# Patient Record
Sex: Female | Born: 1995 | Race: White | Hispanic: No | Marital: Single | Smoking: Never smoker
Health system: Southern US, Community
[De-identification: ages and names within clinical notes are randomized; demographics above are authoritative.]

## PROBLEM LIST (undated history)

## (undated) ENCOUNTER — Inpatient Hospital Stay (HOSPITAL_COMMUNITY): Payer: Self-pay

## (undated) DIAGNOSIS — J45909 Unspecified asthma, uncomplicated: Secondary | ICD-10-CM

## (undated) DIAGNOSIS — F32A Depression, unspecified: Secondary | ICD-10-CM

## (undated) DIAGNOSIS — D649 Anemia, unspecified: Secondary | ICD-10-CM

## (undated) DIAGNOSIS — G43909 Migraine, unspecified, not intractable, without status migrainosus: Secondary | ICD-10-CM

## (undated) DIAGNOSIS — F419 Anxiety disorder, unspecified: Secondary | ICD-10-CM

## (undated) DIAGNOSIS — B999 Unspecified infectious disease: Secondary | ICD-10-CM

## (undated) DIAGNOSIS — J351 Hypertrophy of tonsils: Secondary | ICD-10-CM

## (undated) DIAGNOSIS — Z22322 Carrier or suspected carrier of Methicillin resistant Staphylococcus aureus: Secondary | ICD-10-CM

## (undated) DIAGNOSIS — Z87898 Personal history of other specified conditions: Secondary | ICD-10-CM

## (undated) DIAGNOSIS — G5600 Carpal tunnel syndrome, unspecified upper limb: Secondary | ICD-10-CM

## (undated) DIAGNOSIS — F329 Major depressive disorder, single episode, unspecified: Secondary | ICD-10-CM

## (undated) DIAGNOSIS — J302 Other seasonal allergic rhinitis: Secondary | ICD-10-CM

## (undated) HISTORY — PX: CARPAL TUNNEL RELEASE: SHX101

---
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
## 2015-01-24 ENCOUNTER — Telehealth: Payer: Self-pay | Admitting: Family Medicine

## 2015-01-24 ENCOUNTER — Encounter (HOSPITAL_COMMUNITY): Payer: Self-pay | Admitting: Emergency Medicine

## 2015-01-24 ENCOUNTER — Emergency Department (HOSPITAL_COMMUNITY)
Admission: EM | Admit: 2015-01-24 | Discharge: 2015-01-24 | Disposition: A | Discharge status: Home / Self Care | Payer: BLUE CROSS/BLUE SHIELD | Attending: Emergency Medicine | Admitting: Emergency Medicine

## 2015-01-24 DIAGNOSIS — R197 Diarrhea, unspecified: Secondary | ICD-10-CM

## 2015-01-24 DIAGNOSIS — R1084 Generalized abdominal pain: Secondary | ICD-10-CM | POA: Insufficient documentation

## 2015-01-24 DIAGNOSIS — Z3202 Encounter for pregnancy test, result negative: Secondary | ICD-10-CM | POA: Diagnosis not present

## 2015-01-24 DIAGNOSIS — F419 Anxiety disorder, unspecified: Secondary | ICD-10-CM | POA: Insufficient documentation

## 2015-01-24 DIAGNOSIS — F329 Major depressive disorder, single episode, unspecified: Secondary | ICD-10-CM | POA: Insufficient documentation

## 2015-01-24 DIAGNOSIS — Z8742 Personal history of other diseases of the female genital tract: Secondary | ICD-10-CM | POA: Diagnosis not present

## 2015-01-24 DIAGNOSIS — K625 Hemorrhage of anus and rectum: Secondary | ICD-10-CM | POA: Diagnosis not present

## 2015-01-24 DIAGNOSIS — G4700 Insomnia, unspecified: Secondary | ICD-10-CM | POA: Insufficient documentation

## 2015-01-24 DIAGNOSIS — Z79899 Other long term (current) drug therapy: Secondary | ICD-10-CM | POA: Insufficient documentation

## 2015-01-24 LAB — URINALYSIS, ROUTINE W REFLEX MICROSCOPIC
BILIRUBIN URINE: NEGATIVE
Glucose, UA: NEGATIVE mg/dL
Hgb urine dipstick: NEGATIVE
KETONES UR: NEGATIVE mg/dL
LEUKOCYTES UA: NEGATIVE
Nitrite: NEGATIVE
PROTEIN: NEGATIVE mg/dL
Specific Gravity, Urine: 1.03 (ref 1.005–1.030)
UROBILINOGEN UA: 0.2 mg/dL (ref 0.0–1.0)
pH: 5.5 (ref 5.0–8.0)

## 2015-01-24 LAB — COMPREHENSIVE METABOLIC PANEL
ALT: 19 U/L (ref 14–54)
ANION GAP: 12 (ref 5–15)
AST: 27 U/L (ref 15–41)
Albumin: 4.1 g/dL (ref 3.5–5.0)
Alkaline Phosphatase: 63 U/L (ref 38–126)
BUN: 9 mg/dL (ref 6–20)
CO2: 27 mmol/L (ref 22–32)
Calcium: 9.4 mg/dL (ref 8.9–10.3)
Chloride: 100 mmol/L — ABNORMAL LOW (ref 101–111)
Creatinine, Ser: 0.62 mg/dL (ref 0.44–1.00)
GFR calc non Af Amer: >60 mL/min (ref >60)
Glucose, Bld: 160 mg/dL — ABNORMAL HIGH (ref 65–99)
Potassium: 3.9 mmol/L (ref 3.5–5.1)
Sodium: 139 mmol/L (ref 135–145)
Total Bilirubin: 0.5 mg/dL (ref 0.3–1.2)
Total Protein: 8.1 g/dL (ref 6.5–8.1)

## 2015-01-24 LAB — POC URINE PREG, ED: PREG TEST UR: NEGATIVE

## 2015-01-24 LAB — CBC WITH DIFFERENTIAL/PLATELET
Basophils Absolute: 0 10*3/uL (ref 0.0–0.1)
Basophils Relative: 0 % (ref 0–1)
Eosinophils Absolute: 0.1 10*3/uL (ref 0.0–0.7)
Eosinophils Relative: 1 % (ref 0–5)
HEMATOCRIT: 39.9 % (ref 36.0–46.0)
HEMOGLOBIN: 12.9 g/dL (ref 12.0–15.0)
LYMPHS PCT: 37 % (ref 12–46)
Lymphs Abs: 3.4 10*3/uL (ref 0.7–4.0)
MCH: 26.2 pg (ref 26.0–34.0)
MCHC: 32.3 g/dL (ref 30.0–36.0)
MCV: 80.9 fL (ref 78.0–100.0)
Monocytes Absolute: 0.3 10*3/uL (ref 0.1–1.0)
Monocytes Relative: 3 % (ref 3–12)
NEUTROS PCT: 59 % (ref 43–77)
Neutro Abs: 5.6 10*3/uL (ref 1.7–7.7)
Platelets: 361 10*3/uL (ref 150–400)
RBC: 4.93 MIL/uL (ref 3.87–5.11)
RDW: 14.6 % (ref 11.5–15.5)
WBC: 9.4 10*3/uL (ref 4.0–10.5)

## 2015-01-24 LAB — POC OCCULT BLOOD, ED: Fecal Occult Bld: NEGATIVE

## 2015-01-24 MED ORDER — SODIUM CHLORIDE 0.9 % IV BOLUS (SEPSIS)
1000.0000 mL | Freq: Once | INTRAVENOUS | Status: AC
  Administered 2015-01-24: 1000 mL via INTRAVENOUS

## 2015-01-24 MED ORDER — DICYCLOMINE HCL 20 MG PO TABS: 20.0000 mg | ORAL_TABLET | Freq: Two times a day (BID) | ORAL | Status: DC

## 2015-01-24 NOTE — Discharge Instructions (Signed)
Abdominal Pain, Women °Abdominal (stomach, pelvic, or belly) pain can be caused by many things. It is important to tell your doctor: °· The location of the pain. °· Does it come and go or is it present all the time? °· Are there things that start the pain (eating certain foods, exercise)? °· Are there other symptoms associated with the pain (fever, nausea, vomiting, diarrhea)? °All of this is helpful to know when trying to find the cause of the pain. °CAUSES  °· Stomach: virus or bacteria infection, or ulcer. °· Intestine: appendicitis (inflamed appendix), regional ileitis (Crohn's disease), ulcerative colitis (inflamed colon), irritable bowel syndrome, diverticulitis (inflamed diverticulum of the colon), or cancer of the stomach or intestine. °· Gallbladder disease or stones in the gallbladder. °· Kidney disease, kidney stones, or infection. °· Pancreas infection or cancer. °· Fibromyalgia (pain disorder). °· Diseases of the female organs: °· Uterus: fibroid (non-cancerous) tumors or infection. °· Fallopian tubes: infection or tubal pregnancy. °· Ovary: cysts or tumors. °· Pelvic adhesions (scar tissue). °· Endometriosis (uterus lining tissue growing in the pelvis and on the pelvic organs). °· Pelvic congestion syndrome (female organs filling up with blood just before the menstrual period). °· Pain with the menstrual period. °· Pain with ovulation (producing an egg). °· Pain with an IUD (intrauterine device, birth control) in the uterus. °· Cancer of the female organs. °· Functional pain (pain not caused by a disease, may improve without treatment). °· Psychological pain. °· Depression. °DIAGNOSIS  °Your doctor will decide the seriousness of your pain by doing an examination. °· Blood tests. °· X-rays. °· Ultrasound. °· CT scan (computed tomography, special type of X-ray). °· MRI (magnetic resonance imaging). °· Cultures, for infection. °· Barium enema (dye inserted in the large intestine, to better view it with  X-rays). °· Colonoscopy (looking in intestine with a lighted tube). °· Laparoscopy (minor surgery, looking in abdomen with a lighted tube). °· Major abdominal exploratory surgery (looking in abdomen with a large incision). °TREATMENT  °The treatment will depend on the cause of the pain.  °· Many cases can be observed and treated at home. °· Over-the-counter medicines recommended by your caregiver. °· Prescription medicine. °· Antibiotics, for infection. °· Birth control pills, for painful periods or for ovulation pain. °· Hormone treatment, for endometriosis. °· Nerve blocking injections. °· Physical therapy. °· Antidepressants. °· Counseling with a psychologist or psychiatrist. °· Minor or major surgery. °HOME CARE INSTRUCTIONS  °· Do not take laxatives, unless directed by your caregiver. °· Take over-the-counter pain medicine only if ordered by your caregiver. Do not take aspirin because it can cause an upset stomach or bleeding. °· Try a clear liquid diet (broth or water) as ordered by your caregiver. Slowly move to a bland diet, as tolerated, if the pain is related to the stomach or intestine. °· Have a thermometer and take your temperature several times a day, and record it. °· Bed rest and sleep, if it helps the pain. °· Avoid sexual intercourse, if it causes pain. °· Avoid stressful situations. °· Keep your follow-up appointments and tests, as your caregiver orders. °· If the pain does not go away with medicine or surgery, you may try: °· Acupuncture. °· Relaxation exercises (yoga, meditation). °· Group therapy. °· Counseling. °SEEK MEDICAL CARE IF:  °· You notice certain foods cause stomach pain. °· Your home care treatment is not helping your pain. °· You need stronger pain medicine. °· You want your IUD removed. °· You feel faint or   lightheaded.  You develop nausea and vomiting.  You develop a rash.  You are having side effects or an allergy to your medicine. SEEK IMMEDIATE MEDICAL CARE IF:   Your  pain does not go away or gets worse.  You have a fever.  Your pain is felt only in portions of the abdomen. The right side could possibly be appendicitis. The left lower portion of the abdomen could be colitis or diverticulitis.  You are passing blood in your stools (bright red or black tarry stools, with or without vomiting).  You have blood in your urine.  You develop chills, with or without a fever.  You pass out. MAKE SURE YOU:   Understand these instructions.  Will watch your condition.  Will get help right away if you are not doing well or get worse. Document Released: 05/09/2007 Document Revised: 11/26/2013 Document Reviewed: 05/29/2009 [facility] Patient Information 2015 [place], [place]. This information is not intended to replace advice given to you by your health care provider. Make sure you discuss any questions you have with your health care provider.  Bloody Diarrhea Bloody diarrhea can be caused by many different conditions. Most of the time bloody diarrhea is the result of food poisoning or minor infections. Bloody diarrhea usually improves over 2 to 3 days of rest and fluid replacement. Other conditions that can cause bloody diarrhea include:  Internal bleeding.  Infection.  Diseases of the bowel and colon. Internal bleeding from an ulcer or bowel disease can be severe and requires hospital care or even surgery. DIAGNOSIS  To find out what is wrong your caregiver may check your:  Stool.  Blood.  Results from a test that looks inside the body (endoscopy). TREATMENT   Get plenty of rest.  Drink enough water and fluids to keep your urine clear or pale yellow.  Do not smoke.  Solid foods and dairy products should be avoided until your illness improves.  As you improve, slowly return to a regular diet with easily-digested foods first. Examples are:  Bananas.  Rice.  Toast.  Crackers. You should only need these for about 2 days before adding more  normal foods to your diet.  Avoid spicy or fatty foods as well as caffeine and alcohol for several days.  Medicine to control cramping and diarrhea can relieve symptoms but may prolong some cases of bloody diarrhea. Antibiotics can speed recovery from diarrhea due to some bacterial infections. Call your caregiver if diarrhea does not get better in 3 days. SEEK MEDICAL CARE IF:   You do not improve after 3 days.  Your diarrhea improves but your stool appears black. SEEK IMMEDIATE MEDICAL CARE IF:   You become extremely weak or faint.  You become very sweaty.  You have increased pain or bleeding.  You develop repeated vomiting.  You vomit and you see blood or the vomit looks black in color.  You have a fever. Document Released: 07/12/2005 Document Revised: 10/04/2011 Document Reviewed: 06/13/2009 [facility] Patient Information 2015 [place], [place]. This information is not intended to replace advice given to you by your health care provider. Make sure you discuss any questions you have with your health care provider.

## 2015-01-24 NOTE — Telephone Encounter (Signed)
[facility] - Client TELEPHONE ADVICE RECORD [facility] Patient Name: Alexandra MendsCHRISTINA Meadows DOB: 01/06/1996 Initial Comment Caller states wants to schedule appt; past week and half having yellow diarrhea; now has blood in stool; Nurse Assessment Nurse: Lane HackerHarley, RN, Windy Date/Time (Eastern Time): 01/24/2015 2:29:23 PM Confirm and document reason for call. If symptomatic, describe symptoms. ---Caller states that for the past week and half she has been having yellow diarrhea, and now today has blood in stool in addition to her menses. Severe HA today also, not like her traditional migraines. No fever. Has the patient traveled out of the country within the last 30 days? ---No Does the patient require triage? ---Yes Related visit to physician within the last 2 weeks? ---No Does the PT have any chronic conditions? (i.e. diabetes, asthma, etc.) ---Yes List chronic conditions. ---Migraines Did the patient indicate they were pregnant? ---No Guidelines Guideline Title Affirmed Question Affirmed Notes Diarrhea [1] Blood in the stool AND [2] moderate or large amount of blood moderate amt; 2 episodes of diarrhea in last 24 hours; constant pain not relieved with diarrhea Final Disposition User Go to ED Now Lane HackerHarley, Charity fundraiserN, [place]

## 2015-01-24 NOTE — Telephone Encounter (Signed)
Called and spoke to patient. Patients states she and her mom are en route to take her to a [facility] care. Told patient if for some reason that does not work out to call office and we will be happy to see her next week anytime. Patient verbalized understanding.

## 2015-01-24 NOTE — ED Notes (Signed)
Pt reports diarrhea for the past week and a half. Today noticed bright red blood in stool for the first time. Also having bilateral lower abd pain.

## 2015-01-24 NOTE — ED Provider Notes (Signed)
CSN: [id number]     Arrival date & time 01/24/15  1554 History   First MD Initiated Contact with Patient 01/24/15 1617     Chief Complaint  Patient presents with  . Rectal Bleeding  . Diarrhea     (Consider location/radiation/quality/duration/timing/severity/associated sxs/prior Treatment) HPI Alexandra Meadows is a 19 y.o. female who comes in for evaluation of abdominal discomfort and bloody diarrhea. Patient states for the past week and a half she has had watery, yellow diarrhea, but just noticed yesterday evening she began to have blood mixed in her stool as well as in the toilet bowl. She reports associated bilateral lower abdominal cramps.  She also reports she started her menstrual cycle yesterday, but is certain the blood is not related to menses. Denies any unusual vaginal bleeding or discharge. She denies any fevers, chills, nausea or vomiting, loss of appetite, urinary symptoms. She denies any recent travel, changes in medications. Patient reports she is a vegetarian and does not eat meat, but has been eating more egg sandwiches. Denies any history of Crohn's disease, ulcerative colitis, bleeding disorders.  Past Medical History  Diagnosis Date  . Headache(784.0)   . ADHD (attention deficit hyperactivity disorder)   . Insomnia   . Metrorrhagia   . Anxiety   . Depression   . Back pain    Past Surgical History  Procedure Laterality Date  . Knee arthroscopy Left 02/28/2014    Procedure: ARTHROSCOPY LEFT KNEE WITH SYNOVECTOMY LIMITED, PARTIAL LATERAL MENISECTOMY;  Surgeon: Loreta Aveaniel F Murphy, MD;  Location: [facility];  Service: Orthopedics;  Laterality: Left;  Marland Kitchen. Mouth surgery     History reviewed. No pertinent family history. History  Substance Use Topics  . Smoking status: Never Smoker   . Smokeless tobacco: Never Used     Comment: quit  . Alcohol Use: No   OB History    No data available     Review of Systems A 10 point review of systems was completed and  was negative except for pertinent positives and negatives as mentioned in the history of present illness     Allergies  Manuka honey and Tetanus toxoids  Home Medications   Prior to Admission medications   Medication Sig Start Date End Date Taking? Authorizing Provider  ibuprofen (ADVIL,MOTRIN) 400 MG tablet Take 1 tablet (400 mg total) by mouth every 6 (six) hours as needed. 09/28/14  Yes Fayrene HelperBowie Tran, PA-C  lamoTRIgine (LAMICTAL) 100 MG tablet TAKE 1 TABLET BY MOUTH EVERY DAY Patient taking differently: Take 1 tablet by mouth twice a day. 09/25/14  Yes Adam R Jaffe, DO  LORYNA 3-0.02 MG tablet TAKE 1 TABLET BY MOUTH DAILY. Patient taking differently: Take 1 tablet by mouth daily. 09/23/14  Yes Nelwyn SalisburyStephen A Fry, MD  SUMAtriptan (IMITREX) 100 MG tablet Take 1 tablet (100 mg total) by mouth once as needed for migraine. Patient taking differently: Take 100 mg by mouth daily as needed for migraine.  12/27/14 12/27/15 Yes Nelwyn SalisburyStephen A Fry, MD  amoxicillin-clavulanate (AUGMENTIN) 400-57 MG/5ML suspension Take 10 mLs (800 mg total) by mouth 2 (two) times daily. Patient not taking: Reported on 01/24/2015 12/27/14   Nelwyn SalisburyStephen A Fry, MD  cephALEXin [facility](KEFLEX) 250 MG/5ML suspension Take 5 mLs (250 mg total) by mouth 2 (two) times daily. Patient not taking: Reported on 01/24/2015 12/30/14   Nelwyn SalisburyStephen A Fry, MD  desoximetasone (TOPICORT) 0.05 % cream Apply 1 application topically 2 (two) times daily. Patient not taking: Reported on 09/28/2014 03/12/11   Jeannett SeniorStephen  Marguerita Beards, MD  dicyclomine (BENTYL) 20 MG tablet Take 1 tablet (20 mg total) by mouth 2 (two) times daily. 01/24/15   Joycie Peek, PA-C  methocarbamol (ROBAXIN) 500 MG tablet Take 1 tablet (500 mg total) by mouth 2 (two) times daily. Patient taking differently: Take 500 mg by mouth every 6 (six) hours as needed for muscle spasms.  09/28/14   Fayrene Helper, PA-C  metroNIDAZOLE (FLAGYL) 500 MG tablet Take 1 tablet (500 mg total) by mouth 2 (two) times daily. Patient not taking:  Reported on 09/28/2014 09/16/14   Terressa Koyanagi, DO  metroNIDAZOLE (METROGEL VAGINAL) 0.75 % vaginal gel Place 1 Applicatorful vaginally at bedtime. For 5 days Patient not taking: Reported on 01/24/2015 10/04/14   Terressa Koyanagi, DO  silver sulfADIAZINE (SILVADENE) 1 % cream Apply 1 application topically 2 (two) times daily. Patient taking differently: Apply 1 application topically 2 (two) times daily as needed (burns).  09/04/14   Nelwyn Salisbury, MD   BP 134/84 mmHg  Pulse 82  Temp(Src) 97.5 F (36.4 C) (Oral)  Resp 18  SpO2 100%  LMP 12/27/2014 (Exact Date) Physical Exam  Constitutional: She is oriented to person, place, and time. She appears well-developed and well-nourished.  HENT:  Head: Normocephalic and atraumatic.  Mouth/Throat: Oropharynx is clear and moist.  Eyes: Conjunctivae are normal. Pupils are equal, round, and reactive to light. Right eye exhibits no discharge. Left eye exhibits no discharge. No scleral icterus.  Neck: Neck supple.  Cardiovascular: Normal rate, regular rhythm and normal heart sounds.   Pulmonary/Chest: Effort normal and breath sounds normal. No respiratory distress. She has no wheezes. She has no rales.  Abdominal: Soft. There is no tenderness.  Bilateral lower abdominal mild tenderness to palpation. Negative Rovsing's and heel tap. Abdomen is otherwise soft, nondistended without rebound or guarding. No lesions or deformities noted.  Musculoskeletal: She exhibits no tenderness.  Neurological: She is alert and oriented to person, place, and time.  Cranial Nerves II-XII grossly intact  Skin: Skin is warm and dry. No rash noted.  Psychiatric: She has a normal mood and affect.  Nursing note and vitals reviewed.   ED Course  Procedures (including critical care time) Labs Review Labs Reviewed  COMPREHENSIVE METABOLIC PANEL - Abnormal; Notable for the following:    Chloride 100 (*)    Glucose, Bld 160 (*)    All other components within normal limits   URINALYSIS, ROUTINE W REFLEX MICROSCOPIC (NOT AT [facility]) - Abnormal; Notable for the following:    Color, Urine AMBER (*)    APPearance CLOUDY (*)    All other components within normal limits  CBC WITH DIFFERENTIAL/PLATELET  POC OCCULT BLOOD, ED  POC URINE PREG, ED    Imaging Review No results found.   EKG Interpretation None     Meds given in ED:  Medications  sodium chloride 0.9 % bolus 1,000 mL (1,000 mLs Intravenous New Bag/Given 01/24/15 1741)    New Prescriptions   DICYCLOMINE (BENTYL) 20 MG TABLET    Take 1 tablet (20 mg total) by mouth 2 (two) times daily.   Filed Vitals:   01/24/15 1557  BP: 134/84  Pulse: 82  Temp: 97.5 F (36.4 C)  TempSrc: Oral  Resp: 18  SpO2: 100%    MDM  Vitals stable - WNL -afebrile Pt resting comfortably in ED. PE--lower abdominal cramping without any focal tenderness. No evidence of acute appendicitis or other intra-abdominal emergency. Labwork--Hemoccult-negative, no anemia. Pregnancy negative Labs otherwise noncontributory.  DDX--patient with one episode of reported bloody stool. No evidence of acute GI bleed at this time. We'll have patient follow-up with GI for further evaluation and management of symptoms. No evidence of appendicitis or other acute intra-abdominal emergency. Encourage appropriate rehydration therapy at home. Bentyl for abdominal spasms.  I discussed all relevant lab findings and imaging results with pt and they verbalized understanding. Discussed f/u with PCP within 48 hrs and return precautions, pt very amenable to plan. Prior to patient discharge, I discussed and reviewed this case with Dr.McManus   Final diagnoses:  Diarrhea  Generalized abdominal discomfort        Joycie Peek, PA-C 01/24/15 1839  Samuel Jester, DO 01/27/15 [phone]

## 2015-02-03 ENCOUNTER — Encounter: Payer: Self-pay | Admitting: Family Medicine

## 2015-02-03 ENCOUNTER — Ambulatory Visit (INDEPENDENT_AMBULATORY_CARE_PROVIDER_SITE_OTHER): Payer: BLUE CROSS/BLUE SHIELD | Admitting: Family Medicine

## 2015-02-03 VITALS — BP 133/76 | HR 96 | Temp 98.3°F | Ht 64.0 in | Wt 132.0 lb

## 2015-02-03 DIAGNOSIS — R1030 Lower abdominal pain, unspecified: Secondary | ICD-10-CM | POA: Diagnosis not present

## 2015-02-03 MED ORDER — CIPROFLOXACIN HCL 500 MG PO TABS: 500.0000 mg | ORAL_TABLET | Freq: Two times a day (BID) | ORAL | Status: DC

## 2015-02-03 NOTE — Progress Notes (Signed)
   Subjective:    Patient ID: Alexandra Meadows, female    DOB: 01/18/1996, 19 y.o.   MRN: [id number]  HPI Here to follow up an ER visit on 01-24-15 for abdominal pains and diarrhea with a single episode of blood in the stool. No fevers. No recent travelling. Labs that day were normal including a UA. Her WBC was 9.4. She was told to see us. Now the diarrhea has improved but she still has lower abdominal cramps. No fever. She does have urinary urgency and burning however.    Review of Systems  Constitutional: Negative.   Respiratory: Negative.   Cardiovascular: Negative.   Gastrointestinal: Positive for abdominal pain. Negative for nausea, vomiting, diarrhea, constipation and rectal pain.  Genitourinary: Positive for dysuria, urgency and frequency.       Objective:   Physical Exam  Constitutional: She appears well-developed and well-nourished. No distress.  Cardiovascular: Normal rate, regular rhythm, normal heart sounds and intact distal pulses.   Pulmonary/Chest: Effort normal and breath sounds normal.  Abdominal: Soft. Bowel sounds are normal. She exhibits no distension and no mass. There is no rebound and no guarding.  Mildly tender in bot lower quadrants.           Assessment & Plan:  This is probably a UTI. She is not able to produce a urine sample today so we will treat empirically with Cipro. Drink plenty of fluids.

## 2015-02-03 NOTE — Progress Notes (Signed)
Pre visit review using our clinic review tool, if applicable. No additional management support is needed unless otherwise documented below in the visit note. 

## 2015-03-26 ENCOUNTER — Other Ambulatory Visit: Payer: Self-pay | Admitting: Family Medicine

## 2015-04-19 ENCOUNTER — Other Ambulatory Visit: Payer: Self-pay | Admitting: Neurology

## 2015-04-21 NOTE — Telephone Encounter (Signed)
Pls review.

## 2015-05-05 ENCOUNTER — Other Ambulatory Visit: Payer: Self-pay | Admitting: Neurology

## 2015-05-06 ENCOUNTER — Other Ambulatory Visit: Payer: Self-pay | Admitting: *Deleted

## 2015-05-06 MED ORDER — LAMOTRIGINE 100 MG PO TABS: 100.0000 mg | ORAL_TABLET | Freq: Two times a day (BID) | ORAL | Status: DC

## 2015-06-13 ENCOUNTER — Ambulatory Visit (INDEPENDENT_AMBULATORY_CARE_PROVIDER_SITE_OTHER): Payer: BLUE CROSS/BLUE SHIELD | Admitting: Family Medicine

## 2015-06-13 ENCOUNTER — Encounter: Payer: Self-pay | Admitting: Family Medicine

## 2015-06-13 VITALS — BP 102/69 | HR 66 | Temp 98.4°F | Ht 64.0 in | Wt 144.0 lb

## 2015-06-13 DIAGNOSIS — J45909 Unspecified asthma, uncomplicated: Secondary | ICD-10-CM | POA: Insufficient documentation

## 2015-06-13 DIAGNOSIS — J4520 Mild intermittent asthma, uncomplicated: Secondary | ICD-10-CM

## 2015-06-13 MED ORDER — ALBUTEROL SULFATE HFA 108 (90 BASE) MCG/ACT IN AERS: 2.0000 | INHALATION_SPRAY | RESPIRATORY_TRACT | Status: DC | PRN

## 2015-06-13 NOTE — Progress Notes (Signed)
   Subjective:    Patient ID: Neta MendsChristina Bhullar, female    DOB: 08/02/1995, 19 y.o.   MRN: [id number]  HPI Here for what she thinks is asthma. She describes episodes of coughing, wheezing, and SOB that are often brought on by exercise. This has been happening for several years but has become mor eof an issue this year. No chest pains. She saw [facility] last summer and they were debating as to whether this is true asthma. They gave her a Proair Respiclick to try as a diagnostic maneuver. She says this is difficult for her to use but it does help.    Review of Systems  Constitutional: Negative.   HENT: Negative.   Eyes: Negative.   Respiratory: Positive for cough, chest tightness, shortness of breath and wheezing.   Cardiovascular: Negative.        Objective:   Physical Exam  Constitutional: She appears well-developed and well-nourished. No distress.  HENT:  Right Ear: External ear normal.  Left Ear: External ear normal.  Nose: Nose normal.  Mouth/Throat: Oropharynx is clear and moist.  Eyes: Conjunctivae are normal.  Neck: No thyromegaly present.  Cardiovascular: Normal rate, regular rhythm, normal heart sounds and intact distal pulses.   Pulmonary/Chest: Effort normal and breath sounds normal.  Lymphadenopathy:    She has no cervical adenopathy.          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 Recheck prn

## 2015-06-13 NOTE — Progress Notes (Signed)
Pre visit review using our clinic review tool, if applicable. No additional management support is needed unless otherwise documented below in the visit note. 

## 2015-07-08 ENCOUNTER — Other Ambulatory Visit: Payer: Self-pay | Admitting: Family Medicine

## 2015-07-08 NOTE — Telephone Encounter (Signed)
She already has refills to last until 09-23-15

## 2015-08-20 ENCOUNTER — Telehealth: Payer: Self-pay | Admitting: Family Medicine

## 2015-08-20 ENCOUNTER — Ambulatory Visit: Payer: Self-pay | Admitting: Family Medicine

## 2015-08-20 NOTE — Telephone Encounter (Signed)
Spoke to the pt.  She stated she believes the nausea/vomiting is due to the pregnancy and not illness.  She has her first appointment with the ob/gyn 08/21/15 (tomorrow).  Discussed with Dr. Clent Ridges.  Since pt is able to keep down fluids than ok to wait and see ob tomorrow.  Appointment for this afternoon cancelled.

## 2015-08-20 NOTE — Telephone Encounter (Signed)
PLEASE NOTE: All timestamps contained within this report are represented as [place] Standard Time. CONFIDENTIALTY NOTICE: This fax transmission is intended only for the addressee. It contains information that is legally privileged, confidential or otherwise protected from use or disclosure. If you are not the intended recipient, you are strictly prohibited from reviewing, disclosing, copying using or disseminating any of this information or taking any action in reliance on or regarding this information. If you have received this fax in error, please notify us immediately by telephone so that we can arrange for its return to [place]. Phone: [phone], Toll-Free: [phone], Fax: [phone] Page: 1 of 1 Call Id: [id number] [facility] - Client TELEPHONE ADVICE RECORD [facility] Patient Name: Alexandra Meadows DOB: [birth year] Initial Comment Caller States 7 wks preg, has been vomiting for the past 4 days, and feeling dizzy. Nurse Assessment Nurse: Elijah Birk, RN, Lynda Date/Time (Eastern Time): 08/20/2015 11:49:38 AM Confirm and document reason for call. If symptomatic, describe symptoms. You must click the next button to save text entered. ---Caller states she is 7 wks. pregnant, Has been vomiting for the past 4 days, and feeling dizzy. Able to keep down small amounts of fluid, but vomits solid food. Has the patient traveled out of the country within the last 30 days? ---Not Applicable Does the patient have any new or worsening symptoms? ---Yes Will a triage be completed? ---Yes Related visit to physician within the last 2 weeks? ---No Does the PT have any chronic conditions? (i.e. diabetes, asthma, etc.) ---No Did the patient indicate they were pregnant? ---Yes How many weeks gestation? ---7 Have you felt decreased fetal movement? ---Early Pregnancy - No Fetal Movement Felt Yet Is this a behavioral health or substance abuse call?  ---No Guidelines Guideline Title Affirmed Question Affirmed Notes Pregnancy - Morning Sickness (Nausea and Vomiting of Pregnancy) [1] SEVERE vomiting (e.g., 8 or more times / day) AND [2] present > 8 hours Final Disposition User Go to ED Now (or PCP triage) Elijah Birk, RN, Lynda Comments Caller was going to be seen in an UC or ER, but asked if there was any availability to see her Dr. today. Appt. scheduled. Referrals GO TO FACILITY UNDECIDED  REFERRED TO PCP OFFICE Disagree/Comply: Comply

## 2015-08-21 ENCOUNTER — Emergency Department (HOSPITAL_COMMUNITY): Payer: 59

## 2015-08-21 ENCOUNTER — Emergency Department (HOSPITAL_COMMUNITY)
Admission: EM | Admit: 2015-08-21 | Discharge: 2015-08-21 | Disposition: A | Discharge status: Home / Self Care | Payer: 59 | Attending: Emergency Medicine | Admitting: Emergency Medicine

## 2015-08-21 ENCOUNTER — Encounter (HOSPITAL_COMMUNITY): Payer: Self-pay | Admitting: Emergency Medicine

## 2015-08-21 DIAGNOSIS — O219 Vomiting of pregnancy, unspecified: Secondary | ICD-10-CM | POA: Diagnosis present

## 2015-08-21 DIAGNOSIS — Z8742 Personal history of other diseases of the female genital tract: Secondary | ICD-10-CM | POA: Diagnosis not present

## 2015-08-21 DIAGNOSIS — F419 Anxiety disorder, unspecified: Secondary | ICD-10-CM | POA: Diagnosis not present

## 2015-08-21 DIAGNOSIS — Z3A01 Less than 8 weeks gestation of pregnancy: Secondary | ICD-10-CM | POA: Insufficient documentation

## 2015-08-21 DIAGNOSIS — R52 Pain, unspecified: Secondary | ICD-10-CM

## 2015-08-21 DIAGNOSIS — R1030 Lower abdominal pain, unspecified: Secondary | ICD-10-CM | POA: Insufficient documentation

## 2015-08-21 DIAGNOSIS — Z3490 Encounter for supervision of normal pregnancy, unspecified, unspecified trimester: Secondary | ICD-10-CM

## 2015-08-21 DIAGNOSIS — Z79899 Other long term (current) drug therapy: Secondary | ICD-10-CM | POA: Diagnosis not present

## 2015-08-21 DIAGNOSIS — R112 Nausea with vomiting, unspecified: Secondary | ICD-10-CM

## 2015-08-21 DIAGNOSIS — O99341 Other mental disorders complicating pregnancy, first trimester: Secondary | ICD-10-CM | POA: Insufficient documentation

## 2015-08-21 DIAGNOSIS — O9989 Other specified diseases and conditions complicating pregnancy, childbirth and the puerperium: Secondary | ICD-10-CM | POA: Diagnosis not present

## 2015-08-21 DIAGNOSIS — F329 Major depressive disorder, single episode, unspecified: Secondary | ICD-10-CM | POA: Insufficient documentation

## 2015-08-21 DIAGNOSIS — Z8669 Personal history of other diseases of the nervous system and sense organs: Secondary | ICD-10-CM | POA: Diagnosis not present

## 2015-08-21 LAB — COMPREHENSIVE METABOLIC PANEL
ALT: 28 U/L (ref 14–54)
AST: 24 U/L (ref 15–41)
Albumin: 4.8 g/dL (ref 3.5–5.0)
Alkaline Phosphatase: 57 U/L (ref 38–126)
Anion gap: 12 (ref 5–15)
BUN: 8 mg/dL (ref 6–20)
CO2: 22 mmol/L (ref 22–32)
Calcium: 9.4 mg/dL (ref 8.9–10.3)
Chloride: 101 mmol/L (ref 101–111)
Creatinine, Ser: 0.47 mg/dL (ref 0.44–1.00)
GFR calc Af Amer: >60 mL/min (ref >60)
GFR calc non Af Amer: >60 mL/min (ref >60)
Glucose, Bld: 78 mg/dL (ref 65–99)
Potassium: 3.5 mmol/L (ref 3.5–5.1)
Sodium: 135 mmol/L (ref 135–145)
Total Bilirubin: 0.5 mg/dL (ref 0.3–1.2)
Total Protein: 8 g/dL (ref 6.5–8.1)

## 2015-08-21 LAB — WET PREP, GENITAL
Clue Cells Wet Prep HPF POC: NONE SEEN
Sperm: NONE SEEN
Trich, Wet Prep: NONE SEEN
Yeast Wet Prep HPF POC: NONE SEEN

## 2015-08-21 LAB — CBC
HCT: 37.5 % (ref 36.0–46.0)
HEMOGLOBIN: 12.2 g/dL (ref 12.0–15.0)
MCH: 26.3 pg (ref 26.0–34.0)
MCHC: 32.5 g/dL (ref 30.0–36.0)
MCV: 80.8 fL (ref 78.0–100.0)
Platelets: 335 10*3/uL (ref 150–400)
RBC: 4.64 MIL/uL (ref 3.87–5.11)
RDW: 14.3 % (ref 11.5–15.5)
WBC: 10.4 10*3/uL (ref 4.0–10.5)

## 2015-08-21 LAB — URINALYSIS, ROUTINE W REFLEX MICROSCOPIC
Bilirubin Urine: NEGATIVE
Glucose, UA: NEGATIVE mg/dL
HGB URINE DIPSTICK: NEGATIVE
Ketones, ur: >80 mg/dL — AB
Nitrite: NEGATIVE
Protein, ur: NEGATIVE mg/dL
SPECIFIC GRAVITY, URINE: 1.03 (ref 1.005–1.030)
pH: 6 (ref 5.0–8.0)

## 2015-08-21 LAB — URINE MICROSCOPIC-ADD ON: RBC / HPF: NONE SEEN RBC/hpf (ref 0–5)

## 2015-08-21 LAB — I-STAT BETA HCG BLOOD, ED (MC, WL, AP ONLY)

## 2015-08-21 MED ORDER — METOCLOPRAMIDE HCL 10 MG PO TABS: 10.0000 mg | ORAL_TABLET | Freq: Four times a day (QID) | ORAL | Status: DC

## 2015-08-21 MED ORDER — SODIUM CHLORIDE 0.9 % IV BOLUS (SEPSIS)
1000.0000 mL | Freq: Once | INTRAVENOUS | Status: AC
  Administered 2015-08-21: 1000 mL via INTRAVENOUS

## 2015-08-21 MED ORDER — METOCLOPRAMIDE HCL 5 MG/ML IJ SOLN
5.0000 mg | Freq: Once | INTRAMUSCULAR | Status: AC
  Administered 2015-08-21: 5 mg via INTRAVENOUS
  Filled 2015-08-21: qty 2

## 2015-08-21 NOTE — ED Notes (Signed)
Per pt, states she is [redacted] weeks pregnant-states vomiting for 5 days-unable to keep POs down

## 2015-08-21 NOTE — ED Provider Notes (Signed)
CSN: [id number]     Arrival date & time 08/21/15  1027 History   First MD Initiated Contact with Patient 08/21/15 1140     Chief Complaint  Patient presents with  . Morning Sickness   HPI   G1P0 20 YOF presents today with nausea and vomiting. Patient reports that over the last 5 days she has been throwing up after eating. She reports some baseline nausea with no vomiting in between eating episodes. She denies any upper abdominal pain, but reports she's having bilateral lower abdominal cramping type pain. She denies any vaginal discharge or bleeding, denies any fever, chills, upper abdominal pain, swelling to the hands or feet. She denies any headache reports some minor dizziness. She has had low by mouth intake due to nausea and vomiting. Denies any urinary complaints, bowel movement was normal.    Past Medical History  Diagnosis Date  . Headache(784.0)   . ADHD (attention deficit hyperactivity disorder)   . Insomnia   . Metrorrhagia   . Anxiety   . Depression   . Back pain    Past Surgical History  Procedure Laterality Date  . Knee arthroscopy Left 02/28/2014    Procedure: ARTHROSCOPY LEFT KNEE WITH SYNOVECTOMY LIMITED, PARTIAL LATERAL MENISECTOMY;  Surgeon: Loreta Ave, MD;  Location: [facility];  Service: Orthopedics;  Laterality: Left;  Marland Kitchen Mouth surgery     No family history on file. Social History  Substance Use Topics  . Smoking status: Never Smoker   . Smokeless tobacco: Never Used     Comment: quit  . Alcohol Use: No   OB History    No data available     Review of Systems  All other systems reviewed and are negative.   Allergies  Manuka honey and Tetanus toxoids  Home Medications   Prior to Admission medications   Medication Sig Start Date End Date Taking? Authorizing Provider  acetaminophen (TYLENOL) 500 MG tablet Take 500 mg by mouth every 6 (six) hours as needed for moderate pain or headache.   Yes Historical Provider, MD  albuterol  (PROVENTIL HFA;VENTOLIN HFA) 108 (90 BASE) MCG/ACT inhaler Inhale 2 puffs into the lungs every 4 (four) hours as needed for wheezing or shortness of breath. 06/13/15  Yes Nelwyn Salisbury, MD  amoxicillin (AMOXIL) 500 MG capsule Take 500 mg by mouth 3 (three) times daily. 08/03/15-08/11/16 08/03/15  Yes Historical Provider, MD  calcium carbonate (OS-CAL) 1250 (500 Ca) MG chewable tablet Chew 1-2 tablets by mouth 2 (two) times daily as needed for heartburn.   Yes Historical Provider, MD  SUMAtriptan (IMITREX) 100 MG tablet Take 1 tablet (100 mg total) by mouth once as needed for migraine. Patient taking differently: Take 100 mg by mouth daily as needed for migraine.  12/27/14 12/27/15 Yes Nelwyn Salisbury, MD  lamoTRIgine (LAMICTAL) 100 MG tablet Take 1 tablet (100 mg total) by mouth 2 (two) times daily. Patient not taking: Reported on 08/21/2015 05/06/15   Drema Dallas, DO  LORYNA 3-0.02 MG tablet TAKE 1 TABLET BY MOUTH DAILY. Patient not taking: Reported on 08/21/2015 09/23/14   Nelwyn Salisbury, MD  metoCLOPramide (REGLAN) 10 MG tablet Take 1 tablet (10 mg total) by mouth every 6 (six) hours. 08/21/15   Lillybeth Tal, PA-C   BP 121/70 mmHg  Pulse 110  Temp(Src) 98.1 F (36.7 C) (Oral)  Resp 18  SpO2 99%  LMP 07/02/2015   Physical Exam  Constitutional: She is oriented to person, place, and time.  She appears well-developed and well-nourished.  HENT:  Head: Normocephalic and atraumatic.  Eyes: Conjunctivae are normal. Pupils are equal, round, and reactive to light. Right eye exhibits no discharge. Left eye exhibits no discharge. No scleral icterus.  Neck: Normal range of motion. No JVD present. No tracheal deviation present.  Cardiovascular: Normal rate, regular rhythm, normal heart sounds and intact distal pulses.  Exam reveals no gallop and no friction rub.   No murmur heard. Pulmonary/Chest: Effort normal and breath sounds normal. No stridor. No respiratory distress. She has no wheezes. She has no rales. She  exhibits no tenderness.  Abdominal: Soft. She exhibits no distension and no mass. There is no tenderness. There is no rebound and no guarding.  Musculoskeletal: Normal range of motion. She exhibits no edema or tenderness.  Neurological: She is alert and oriented to person, place, and time. Coordination normal.  Skin: Skin is warm and dry. No rash noted. No erythema. No pallor.  Psychiatric: She has a normal mood and affect. Her behavior is normal. Judgment and thought content normal.  Nursing note and vitals reviewed.   ED Course  Procedures (including critical care time) Labs Review Labs Reviewed  WET PREP, GENITAL - Abnormal; Notable for the following:    WBC, Wet Prep HPF POC MODERATE (*)    All other components within normal limits  URINALYSIS, ROUTINE W REFLEX MICROSCOPIC (NOT AT [facility]) - Abnormal; Notable for the following:    APPearance TURBID (*)    Ketones, ur >80 (*)    Leukocytes, UA TRACE (*)    All other components within normal limits  URINE MICROSCOPIC-ADD ON - Abnormal; Notable for the following:    Squamous Epithelial / LPF 6-30 (*)    Bacteria, UA FEW (*)    All other components within normal limits  I-STAT BETA HCG BLOOD, ED ([facility], [facility], [facility] ONLY) - Abnormal; Notable for the following:    I-stat hCG, quantitative >2000.0 (*)    All other components within normal limits  COMPREHENSIVE METABOLIC PANEL  CBC  RPR  HIV ANTIBODY (ROUTINE TESTING)  GC/CHLAMYDIA PROBE AMP ([place]) NOT AT [facility]    Imaging Review US Ob Comp Less 14 Wks  08/21/2015  CLINICAL DATA:  First trimester pregnancy, lower abdominal pain for 2 weeks. EXAM: OBSTETRIC <14 WK [place] AND TRANSVAGINAL OB US TECHNIQUE: Both transabdominal and transvaginal ultrasound examinations were performed for complete evaluation of the gestation as well as the maternal uterus, adnexal regions, and pelvic cul-de-sac. Transvaginal technique was performed to assess early pregnancy. COMPARISON:  None. FINDINGS: Intrauterine  gestational sac: Visualized/normal in shape. Yolk sac:  Visualized. Embryo:  Visualized. Cardiac Activity: Visualized. Heart Rate: 144  bpm CRL: 11 mm 7 w 2 d [place] [facility]: April 06, 2016. Subchorionic hemorrhage:  Small subchorionic hemorrhage is noted. Maternal uterus/adnexae: Right ovary appears normal. Probable corpus luteum cyst seen in left ovary. No free fluid is noted. IMPRESSION: Single live intrauterine gestation of 7 weeks 2 days. Small subchronic hemorrhage is noted. Electronically Signed   By: Lupita Raider, M.D.   On: 08/21/2015 14:42   US Ob Transvaginal  08/21/2015  CLINICAL DATA:  First trimester pregnancy, lower abdominal pain for 2 weeks. EXAM: OBSTETRIC <14 WK [place] AND TRANSVAGINAL OB US TECHNIQUE: Both transabdominal and transvaginal ultrasound examinations were performed for complete evaluation of the gestation as well as the maternal uterus, adnexal regions, and pelvic cul-de-sac. Transvaginal technique was performed to assess early pregnancy. COMPARISON:  None. FINDINGS: Intrauterine gestational sac: Visualized/normal  in shape. Yolk sac:  Visualized. Embryo:  Visualized. Cardiac Activity: Visualized. Heart Rate: 144  bpm CRL: 11 mm 7 w 2 d [place] [facility]: April 06, 2016. Subchorionic hemorrhage:  Small subchorionic hemorrhage is noted. Maternal uterus/adnexae: Right ovary appears normal. Probable corpus luteum cyst seen in left ovary. No free fluid is noted. IMPRESSION: Single live intrauterine gestation of 7 weeks 2 days. Small subchronic hemorrhage is noted. Electronically Signed   By: Lupita Raider, M.D.   On: 08/21/2015 14:42   I have personally reviewed and evaluated these images and lab results as part of my medical decision-making.   EKG Interpretation None      MDM   Final diagnoses:  Non-intractable vomiting with nausea, vomiting of unspecified type  Pregnancy    Labs: Urinalysis, i-STAT beta hCG, CMP and CBC  Imaging: OB ultrasound less than 14  weeks  Consults:  Therapeutics: Reglan  Discharge Meds:   Assessment/Plan: 20 year old female presents today with nausea and vomiting. Patient is afebrile, nontoxic, she was treated here in the ED with normal saline and Reglan with good improvement in her symptoms. She was able tolerate by mouth while here in ED. Patient also complained of lower abdominal discomfort, she is [redacted] weeks pregnant, ultrasound shows intrauterine pregnancy with subchorionic hemorrhage. This information was relayed on the patient with relevant follow-up precautions. With patient tolerating by mouth, other laboratory analysis unrevealing, patient will be discharged home with strict return precautions and OB/GYN follow-up. Patient verbalized understanding and agreement for today's plan and had no further questions or concerns at time of discharge.        Eyvonne Mechanic, PA-C 08/21/15 1720  Mancel Bale, MD 08/22/15 [phone]

## 2015-08-21 NOTE — Discharge Instructions (Signed)
Please read attached information. If you experience any new or worsening signs or symptoms please return to the emergency room for evaluation. Please follow-up with your primary care provider or specialist as discussed. Please use medication prescribed only as directed and discontinue taking if you have any concerning signs or symptoms.   °

## 2015-08-22 LAB — GC/CHLAMYDIA PROBE AMP (~~LOC~~) NOT AT ARMC
Chlamydia: NEGATIVE
Neisseria Gonorrhea: NEGATIVE

## 2015-08-22 LAB — RPR: RPR Ser Ql: NONREACTIVE

## 2015-08-22 LAB — HIV ANTIBODY (ROUTINE TESTING W REFLEX): HIV Screen 4th Generation wRfx: NONREACTIVE

## 2015-09-10 LAB — OB RESULTS CONSOLE HEPATITIS B SURFACE ANTIGEN: HEP B S AG: NEGATIVE

## 2015-09-10 LAB — OB RESULTS CONSOLE ANTIBODY SCREEN: ANTIBODY SCREEN: NEGATIVE

## 2015-09-10 LAB — OB RESULTS CONSOLE ABO/RH: RH Type: POSITIVE

## 2015-09-10 LAB — OB RESULTS CONSOLE RUBELLA ANTIBODY, IGM: Rubella: IMMUNE

## 2015-09-10 LAB — OB RESULTS CONSOLE GC/CHLAMYDIA: GC PROBE AMP, GENITAL: NEGATIVE

## 2015-11-12 ENCOUNTER — Inpatient Hospital Stay (HOSPITAL_COMMUNITY)
Admission: AD | Admit: 2015-11-12 | Discharge: 2015-11-12 | Disposition: A | Discharge status: Home / Self Care | Payer: 59 | Source: Ambulatory Visit | Attending: Obstetrics and Gynecology | Admitting: Obstetrics and Gynecology

## 2015-11-12 ENCOUNTER — Encounter (HOSPITAL_COMMUNITY): Payer: Self-pay | Admitting: Student

## 2015-11-12 DIAGNOSIS — G43009 Migraine without aura, not intractable, without status migrainosus: Secondary | ICD-10-CM | POA: Diagnosis not present

## 2015-11-12 DIAGNOSIS — O219 Vomiting of pregnancy, unspecified: Secondary | ICD-10-CM

## 2015-11-12 DIAGNOSIS — Z91018 Allergy to other foods: Secondary | ICD-10-CM | POA: Diagnosis not present

## 2015-11-12 DIAGNOSIS — O26892 Other specified pregnancy related conditions, second trimester: Secondary | ICD-10-CM | POA: Insufficient documentation

## 2015-11-12 DIAGNOSIS — O210 Mild hyperemesis gravidarum: Secondary | ICD-10-CM | POA: Diagnosis not present

## 2015-11-12 DIAGNOSIS — F419 Anxiety disorder, unspecified: Secondary | ICD-10-CM | POA: Insufficient documentation

## 2015-11-12 DIAGNOSIS — F909 Attention-deficit hyperactivity disorder, unspecified type: Secondary | ICD-10-CM | POA: Diagnosis not present

## 2015-11-12 DIAGNOSIS — Z3A19 19 weeks gestation of pregnancy: Secondary | ICD-10-CM | POA: Insufficient documentation

## 2015-11-12 DIAGNOSIS — F329 Major depressive disorder, single episode, unspecified: Secondary | ICD-10-CM | POA: Insufficient documentation

## 2015-11-12 DIAGNOSIS — Z887 Allergy status to serum and vaccine status: Secondary | ICD-10-CM | POA: Diagnosis not present

## 2015-11-12 DIAGNOSIS — Z79899 Other long term (current) drug therapy: Secondary | ICD-10-CM | POA: Diagnosis not present

## 2015-11-12 DIAGNOSIS — R51 Headache: Secondary | ICD-10-CM | POA: Diagnosis present

## 2015-11-12 LAB — URINALYSIS, ROUTINE W REFLEX MICROSCOPIC
Bilirubin Urine: NEGATIVE
GLUCOSE, UA: NEGATIVE mg/dL
HGB URINE DIPSTICK: NEGATIVE
Ketones, ur: NEGATIVE mg/dL
Nitrite: NEGATIVE
PROTEIN: NEGATIVE mg/dL
Specific Gravity, Urine: 1.025 (ref 1.005–1.030)
pH: 5.5 (ref 5.0–8.0)

## 2015-11-12 LAB — URINE MICROSCOPIC-ADD ON: RBC / HPF: NONE SEEN RBC/hpf (ref 0–5)

## 2015-11-12 MED ORDER — METOCLOPRAMIDE HCL 5 MG/ML IJ SOLN
10.0000 mg | Freq: Once | INTRAMUSCULAR | Status: AC
  Administered 2015-11-12: 10 mg via INTRAVENOUS
  Filled 2015-11-12: qty 2

## 2015-11-12 MED ORDER — PROMETHAZINE HCL 25 MG PO TABS: 25.0000 mg | ORAL_TABLET | Freq: Four times a day (QID) | ORAL | Status: DC | PRN

## 2015-11-12 MED ORDER — SODIUM CHLORIDE 0.9 % IV SOLN
INTRAVENOUS | Status: DC
  Administered 2015-11-12: 17:00:00 via INTRAVENOUS

## 2015-11-12 MED ORDER — DIPHENHYDRAMINE HCL 50 MG/ML IJ SOLN
25.0000 mg | Freq: Once | INTRAMUSCULAR | Status: AC
  Administered 2015-11-12: 25 mg via INTRAVENOUS
  Filled 2015-11-12: qty 1

## 2015-11-12 MED ORDER — DEXAMETHASONE SODIUM PHOSPHATE 10 MG/ML IJ SOLN
10.0000 mg | Freq: Once | INTRAMUSCULAR | Status: AC
  Administered 2015-11-12: 10 mg via INTRAVENOUS
  Filled 2015-11-12: qty 1

## 2015-11-12 NOTE — MAU Note (Signed)
Patient presents with a headache since Saturday. Patient states she has been throwing up but it has been worse since the headaches. She has taken tylenol and doing warm and cold compress with no relief.

## 2015-11-12 NOTE — Discharge Instructions (Signed)

## 2015-11-12 NOTE — MAU Provider Note (Signed)
History     CSN: [id number]  Arrival date and time: 11/12/15 1550   First Provider Initiated Contact with Patient 11/12/15 1618       Chief Complaint  Patient presents with  . Headache   HPI Alexandra Meadows is a 20 y.o. G1P0 at 2647w0d who presents with headache & nausea/vomiting. Symptoms began on Saturday. Has taken tylenol with no relief, last took tylenol last night. Has history of migraines & reports this headache is similar to previous headaches. Describes headache as bilateral & pulsating. Rates 7/10. Has vomited 3 times in the last 24 hours & is currently nauseated. Denies fever, vision changes, diarrhea, constipation, abdominal pain, or vaginal bleeding.  OB History    Gravida Para Term Preterm AB TAB SAB Ectopic Multiple Living   1               Past Medical History  Diagnosis Date  . Headache(784.0)   . ADHD (attention deficit hyperactivity disorder)   . Insomnia   . Metrorrhagia   . Anxiety   . Depression   . Back pain     Past Surgical History  Procedure Laterality Date  . Knee arthroscopy Left 02/28/2014    Procedure: ARTHROSCOPY LEFT KNEE WITH SYNOVECTOMY LIMITED, PARTIAL LATERAL MENISECTOMY;  Surgeon: Loreta Aveaniel F Murphy, MD;  Location: [facility];  Service: Orthopedics;  Laterality: Left;  Marland Kitchen. Mouth surgery      History reviewed. No pertinent family history.  Social History  Substance Use Topics  . Smoking status: Never Smoker   . Smokeless tobacco: Never Used     Comment: quit  . Alcohol Use: No    Allergies:  Allergies  Allergen Reactions  . Manuka Honey     Actual honey- Blisters.   . Tetanus Toxoids Rash    Pt received tetanus shot 02-19-14 and developed a rash on her abdomen    Prescriptions prior to admission  Medication Sig Dispense Refill Last Dose  . acetaminophen (TYLENOL) 500 MG tablet Take 500 mg by mouth every 6 (six) hours as needed for moderate pain or headache.   11/11/2015 at Unknown time  . Doxylamine-Pyridoxine  (DICLEGIS) 10-10 MG TBEC Take 1 tablet by mouth daily.   11/11/2015 at Unknown time  . albuterol (PROVENTIL HFA;VENTOLIN HFA) 108 (90 BASE) MCG/ACT inhaler Inhale 2 puffs into the lungs every 4 (four) hours as needed for wheezing or shortness of breath. 1 Inhaler 11 Past Month at Unknown time  . amoxicillin (AMOXIL) 500 MG capsule Take 500 mg by mouth 3 (three) times daily. 08/03/15-08/11/16  0 Past Month at Unknown time  . calcium carbonate (OS-CAL) 1250 (500 Ca) MG chewable tablet Chew 1-2 tablets by mouth 2 (two) times daily as needed for heartburn.   08/20/2015 at Unknown time  . lamoTRIgine (LAMICTAL) 100 MG tablet Take 1 tablet (100 mg total) by mouth 2 (two) times daily. (Patient not taking: Reported on 08/21/2015) 60 tablet 0 Not Taking at Unknown time  . LORYNA 3-0.02 MG tablet TAKE 1 TABLET BY MOUTH DAILY. (Patient not taking: Reported on 08/21/2015) 28 tablet 11 Not Taking at Unknown time  . metoCLOPramide (REGLAN) 10 MG tablet Take 1 tablet (10 mg total) by mouth every 6 (six) hours. 15 tablet 0   . SUMAtriptan (IMITREX) 100 MG tablet Take 1 tablet (100 mg total) by mouth once as needed for migraine. (Patient taking differently: Take 100 mg by mouth daily as needed for migraine. ) 12 tablet 11 unknown  Review of Systems  Constitutional: Negative.   HENT: Negative for tinnitus.   Eyes: Negative for blurred vision and photophobia.  Cardiovascular: Negative.   Gastrointestinal: Positive for nausea and vomiting. Negative for abdominal pain, diarrhea and constipation.  Genitourinary: Negative.   Neurological: Positive for headaches. Negative for dizziness.   Physical Exam   Blood pressure 117/56, pulse 95, temperature 98.1 F (36.7 C), temperature source Oral, resp. rate 16, last menstrual period 07/02/2015.  Physical Exam  Nursing note and vitals reviewed. Constitutional: She is oriented to person, place, and time. She appears well-developed and well-nourished. No distress.  HENT:   Head: Normocephalic and atraumatic.  Eyes: Conjunctivae are normal. Right eye exhibits no discharge. Left eye exhibits no discharge. No scleral icterus.  Neck: Normal range of motion.  Cardiovascular: Normal rate, regular rhythm and normal heart sounds.   No murmur heard. Respiratory: Effort normal and breath sounds normal. No respiratory distress. She has no wheezes.  GI: Soft. Bowel sounds are normal. There is no tenderness.  Neurological: She is alert and oriented to person, place, and time.  Skin: Skin is warm and dry. She is not diaphoretic.  Psychiatric: She has a normal mood and affect. Her behavior is normal. Judgment and thought content normal.    MAU Course  Procedures Results for orders placed or performed during the hospital encounter of 11/12/15 (from the past 24 hour(s))  Urinalysis, Routine w reflex microscopic (not at [facility])     Status: Abnormal   Collection Time: 11/12/15  3:59 PM  Result Value Ref Range   Color, Urine YELLOW YELLOW   APPearance HAZY (A) CLEAR   Specific Gravity, Urine 1.025 1.005 - 1.030   pH 5.5 5.0 - 8.0   Glucose, UA NEGATIVE NEGATIVE mg/dL   Hgb urine dipstick NEGATIVE NEGATIVE   Bilirubin Urine NEGATIVE NEGATIVE   Ketones, ur NEGATIVE NEGATIVE mg/dL   Protein, ur NEGATIVE NEGATIVE mg/dL   Nitrite NEGATIVE NEGATIVE   Leukocytes, UA TRACE (A) NEGATIVE  Urine microscopic-add on     Status: Abnormal   Collection Time: 11/12/15  3:59 PM  Result Value Ref Range   Squamous Epithelial / LPF 6-30 (A) NONE SEEN   WBC, UA 0-5 0 - 5 WBC/hpf   RBC / HPF NONE SEEN 0 - 5 RBC/hpf   Bacteria, UA FEW (A) NONE SEEN   Crystals CA OXALATE CRYSTALS (A) NEGATIVE   Urine-Other MUCOUS PRESENT     MDM FHT 152 by doppler IV started & headache cocktail given (decadron, reglan, benadryl) Pt reports moderate improvement in symptoms. Now rates headache 2/10.  S/w Dr. Renaldo Fiddler. Ok to discharge home with phenergan rx to have on hand.  Assessment and Plan  A: 1.  Migraine without aura and without status migrainosus, not intractable   2. Vomiting affecting pregnancy     P: Discharge home Rx phenergan Discussed reasons to return to MAU Keep f/u with OB  Judeth Horn 11/12/2015, 4:18 PM

## 2015-11-20 ENCOUNTER — Encounter: Payer: Self-pay | Admitting: Family Medicine

## 2015-11-20 ENCOUNTER — Ambulatory Visit (INDEPENDENT_AMBULATORY_CARE_PROVIDER_SITE_OTHER): Payer: 59 | Admitting: Family Medicine

## 2015-11-20 VITALS — BP 120/64 | HR 89 | Temp 98.2°F | Resp 16 | Wt 139.0 lb

## 2015-11-20 DIAGNOSIS — R05 Cough: Secondary | ICD-10-CM

## 2015-11-20 DIAGNOSIS — J309 Allergic rhinitis, unspecified: Secondary | ICD-10-CM

## 2015-11-20 DIAGNOSIS — R059 Cough, unspecified: Secondary | ICD-10-CM

## 2015-11-20 DIAGNOSIS — Z8709 Personal history of other diseases of the respiratory system: Secondary | ICD-10-CM | POA: Diagnosis not present

## 2015-11-20 NOTE — Patient Instructions (Signed)
Please use claritin and flonase as needed for symptoms. Albuterol can be used during these symptoms and if you are using this twice/day and symptoms have not improved in 3-4 days, please return to clinic for further evaluation.  Allergic Rhinitis Allergic rhinitis is when the mucous membranes in the nose respond to allergens. Allergens are particles in the air that cause your body to have an allergic reaction. This causes you to release allergic antibodies. Through a chain of events, these eventually cause you to release histamine into the blood stream. Although meant to protect the body, it is this release of histamine that causes your discomfort, such as frequent sneezing, congestion, and an itchy, runny nose.  CAUSES Seasonal allergic rhinitis (hay fever) is caused by pollen allergens that may come from grasses, trees, and weeds. Year-round allergic rhinitis (perennial allergic rhinitis) is caused by allergens such as house dust mites, pet dander, and mold spores. SYMPTOMS  Nasal stuffiness (congestion).  Itchy, runny nose with sneezing and tearing of the eyes. DIAGNOSIS Your health care provider can help you determine the allergen or allergens that trigger your symptoms. If you and your health care provider are unable to determine the allergen, skin or blood testing may be used. Your health care provider will diagnose your condition after taking your health history and performing a physical exam. Your health care provider may assess you for other related conditions, such as asthma, pink eye, or an ear infection. TREATMENT Allergic rhinitis does not have a cure, but it can be controlled by:  Medicines that block allergy symptoms. These may include allergy shots, nasal sprays, and oral antihistamines.  Avoiding the allergen. Hay fever may often be treated with antihistamines in pill or nasal spray forms. Antihistamines block the effects of histamine. There are over-the-counter medicines that may  help with nasal congestion and swelling around the eyes. Check with your health care provider before taking or giving this medicine. If avoiding the allergen or the medicine prescribed do not work, there are many new medicines your health care provider can prescribe. Stronger medicine may be used if initial measures are ineffective. Desensitizing injections can be used if medicine and avoidance does not work. Desensitization is when a patient is given ongoing shots until the body becomes less sensitive to the allergen. Make sure you follow up with your health care provider if problems continue. HOME CARE INSTRUCTIONS It is not possible to completely avoid allergens, but you can reduce your symptoms by taking steps to limit your exposure to them. It helps to know exactly what you are allergic to so that you can avoid your specific triggers. SEEK MEDICAL CARE IF:  You have a fever.  You develop a cough that does not stop easily (persistent).  You have shortness of breath.  You start wheezing.  Symptoms interfere with normal daily activities.   This information is not intended to replace advice given to you by your health care provider. Make sure you discuss any questions you have with your health care provider.   Document Released: 04/06/2001 Document Revised: 08/02/2014 Document Reviewed: 03/19/2013 Elsevier Interactive Patient Education [employer]2016 Elsevier Inc.

## 2015-11-20 NOTE — Progress Notes (Addendum)
Subjective:    Patient ID: Alexandra Meadows, female    DOB: 02/17/1996, 20 y.o.   MRN: [id number]  HPI  [name] is a 20 year old 7220 week pregnant female who presents today with a cough for 5 days that is nonproductive. History of exercise induced asthma and seasonal allergies.  She states that allergy symptoms are present year round and she recently stopped treating her allergies due to nausea associated with pregnancy. Today, nausea has improved and she notes associated itchy, watery eyes, sinus pressure, sneezing, and PND. She denies fever, chills, sweats, ear pain, tooth pain, and symptoms of GERD. No recent antibiotic use or recent sick contact exposure. Benedryl at night has provided moderate benefit. Previous use of  claritin and flonase provided benefit for symptoms when used previously.  Albuterol prescribed by allergy and asthma center for exercise as needed. She states that this is only needed with vigorous exercise and she has not used it in many months for this purpose. Since symptoms started patient reports using albuterol one time for cough during the past week with moderate benefit.  Review of Systems  Constitutional: Negative for fever, chills and fatigue.  HENT: Positive for postnasal drip, sinus pressure and sneezing.   Eyes: Positive for itching. Negative for visual disturbance.  Respiratory: Positive for cough. Negative for wheezing.   Cardiovascular: Negative for chest pain, palpitations and leg swelling.  Gastrointestinal: Positive for nausea. Negative for vomiting, abdominal pain, diarrhea and constipation.       Nausea due to pregnancy which has improved. Diclegis  Genitourinary: Negative for dysuria, urgency, hematuria and flank pain.  Musculoskeletal: Negative for myalgias and arthralgias.  Skin: Negative for rash.  Neurological: Negative for dizziness, light-headedness and headaches.  Psychiatric/Behavioral:       Denies depressed or anxious mood   Past  Medical History  Diagnosis Date  . Headache(784.0)   . ADHD (attention deficit hyperactivity disorder)   . Insomnia   . Metrorrhagia   . Anxiety   . Depression   . Back pain      Social History   Social History  . Marital Status: Single    Spouse Name: N/A  . Number of Children: N/A  . Years of Education: N/A   Occupational History  . Not on file.   Social History Main Topics  . Smoking status: Never Smoker   . Smokeless tobacco: Never Used     Comment: quit  . Alcohol Use: No  . Drug Use: No  . Sexual Activity: Yes    Birth Control/ Protection: Pill   Other Topics Concern  . 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    Past Surgical History  Procedure Laterality Date  . Knee arthroscopy Left 02/28/2014    Procedure: ARTHROSCOPY LEFT KNEE WITH SYNOVECTOMY LIMITED, PARTIAL LATERAL MENISECTOMY;  Surgeon: Loreta Aveaniel F Murphy, MD;  Location: [facility];  Service: Orthopedics;  Laterality: Left;  Marland Kitchen. Mouth surgery      No family history on file.  Allergies  Allergen Reactions  . Manuka Honey     Actual honey- Blisters.   . Tetanus Toxoids Rash    Pt received tetanus shot 02-19-14 and developed a rash on her abdomen    Current Outpatient Prescriptions on File Prior to Visit  Medication Sig Dispense Refill  . acetaminophen (TYLENOL) 500 MG tablet  Take 500 mg by mouth every 6 (six) hours as needed for moderate pain or headache.    . albuterol (PROVENTIL HFA;VENTOLIN HFA) 108 (90 BASE) MCG/ACT inhaler Inhale 2 puffs into the lungs every 4 (four) hours as needed for wheezing or shortness of breath. 1 Inhaler 11  . promethazine (PHENERGAN) 25 MG tablet Take 1 tablet (25 mg total) by mouth every 6 (six) hours as needed for nausea or vomiting. 30 tablet 0  . Doxylamine-Pyridoxine (DICLEGIS) 10-10 MG TBEC Take 1 tablet by mouth daily.  Reported on 11/20/2015    . montelukast (SINGULAIR) 10 MG tablet Reported on 11/20/2015  5   No current facility-administered medications on file prior to visit.    BP 120/64 mmHg  Pulse 89  Temp(Src) 98.2 F (36.8 C) (Oral)  Wt 139 lb (63.05 kg)  SpO2 98%  LMP 07/02/2015       Objective:   Physical Exam  Constitutional: She is oriented to person, place, and time. She appears well-developed and well-nourished.  HENT:  Right Ear: Tympanic membrane normal.  Left Ear: Tympanic membrane normal.  Nose: Rhinorrhea present. Right sinus exhibits no maxillary sinus tenderness and no frontal sinus tenderness. Left sinus exhibits no maxillary sinus tenderness and no frontal sinus tenderness.  Mouth/Throat: Oropharynx is clear and moist and mucous membranes are normal. No oropharyngeal exudate or posterior oropharyngeal erythema.  Eyes: Pupils are equal, round, and reactive to light.  Neck: Neck supple.  Cardiovascular: Normal rate, regular rhythm and normal heart sounds.  Exam reveals no gallop and no friction rub.   No murmur heard. Pulmonary/Chest: Effort normal and breath sounds normal. She has no wheezes. She has no rales.  Abdominal: Soft. Bowel sounds are normal.  Lymphadenopathy:    She has no cervical adenopathy.  Neurological: She is alert and oriented to person, place, and time.  Skin: Skin is warm and dry. No rash noted.  Psychiatric: She has a normal mood and affect. Her behavior is normal. Judgment and thought content normal.       Assessment & Plan:  1. Allergic rhinitis, unspecified allergic rhinitis type Nausea has improved with Diclegis per patient report. Claritin and flonase can be used for symptoms of allergic rhinitis.   2. Cough Delsym or Mucinex DM can be used for cough as needed. Medications checked for safety with pregnancy. Reviewed proper technique for inhaler use with patient. Albuterol can be used if needed for SOB or wheezing. Advised patient to RTC if  she is using her albuterol daily and symptoms have not improved.  Advised patient to RTC if symptoms do not improve in 3-4 days, worsen, or she develops a fever >101. Also, discussed the importance of follow up if she notes any SOB or wheezing.   3. History of intrinsic asthma Albuterol inhaler as needed before exercise. Follow up with PCP prn.  Roddie Mc, FNP-C

## 2016-02-03 ENCOUNTER — Ambulatory Visit (INDEPENDENT_AMBULATORY_CARE_PROVIDER_SITE_OTHER): Payer: 59 | Admitting: *Deleted

## 2016-02-03 DIAGNOSIS — Z23 Encounter for immunization: Secondary | ICD-10-CM | POA: Diagnosis not present

## 2016-02-05 LAB — TB SKIN TEST
Induration: 0 mm
TB Skin Test: NEGATIVE

## 2016-02-26 ENCOUNTER — Encounter (HOSPITAL_COMMUNITY): Payer: Self-pay | Admitting: *Deleted

## 2016-02-26 ENCOUNTER — Inpatient Hospital Stay (HOSPITAL_COMMUNITY)
Admission: AD | Admit: 2016-02-26 | Discharge: 2016-02-27 | Disposition: A | Discharge status: Home / Self Care | Payer: 59 | Source: Ambulatory Visit | Attending: Obstetrics and Gynecology | Admitting: Obstetrics and Gynecology

## 2016-02-26 DIAGNOSIS — F419 Anxiety disorder, unspecified: Secondary | ICD-10-CM | POA: Diagnosis not present

## 2016-02-26 DIAGNOSIS — R102 Pelvic and perineal pain: Secondary | ICD-10-CM | POA: Diagnosis not present

## 2016-02-26 DIAGNOSIS — R42 Dizziness and giddiness: Secondary | ICD-10-CM | POA: Diagnosis not present

## 2016-02-26 DIAGNOSIS — F909 Attention-deficit hyperactivity disorder, unspecified type: Secondary | ICD-10-CM | POA: Insufficient documentation

## 2016-02-26 DIAGNOSIS — F329 Major depressive disorder, single episode, unspecified: Secondary | ICD-10-CM | POA: Diagnosis not present

## 2016-02-26 DIAGNOSIS — Z3A34 34 weeks gestation of pregnancy: Secondary | ICD-10-CM | POA: Insufficient documentation

## 2016-02-26 DIAGNOSIS — N949 Unspecified condition associated with female genital organs and menstrual cycle: Secondary | ICD-10-CM

## 2016-02-26 DIAGNOSIS — O99343 Other mental disorders complicating pregnancy, third trimester: Secondary | ICD-10-CM | POA: Diagnosis not present

## 2016-02-26 DIAGNOSIS — O26893 Other specified pregnancy related conditions, third trimester: Secondary | ICD-10-CM | POA: Diagnosis present

## 2016-02-26 LAB — URINALYSIS, ROUTINE W REFLEX MICROSCOPIC
BILIRUBIN URINE: NEGATIVE
Glucose, UA: NEGATIVE mg/dL
KETONES UR: NEGATIVE mg/dL
Nitrite: NEGATIVE
PH: 6 (ref 5.0–8.0)
Protein, ur: NEGATIVE mg/dL
SPECIFIC GRAVITY, URINE: 1.015 (ref 1.005–1.030)

## 2016-02-26 LAB — URINE MICROSCOPIC-ADD ON

## 2016-02-26 LAB — CBC
HCT: 33.2 % — ABNORMAL LOW (ref 36.0–46.0)
Hemoglobin: 11.4 g/dL — ABNORMAL LOW (ref 12.0–15.0)
MCH: 29.2 pg (ref 26.0–34.0)
MCHC: 34.3 g/dL (ref 30.0–36.0)
MCV: 84.9 fL (ref 78.0–100.0)
PLATELETS: 254 10*3/uL (ref 150–400)
RBC: 3.91 MIL/uL (ref 3.87–5.11)
RDW: 13.9 % (ref 11.5–15.5)
WBC: 12.8 10*3/uL — ABNORMAL HIGH (ref 4.0–10.5)

## 2016-02-26 LAB — COMPREHENSIVE METABOLIC PANEL
ALT: 11 U/L — ABNORMAL LOW (ref 14–54)
ANION GAP: 11 (ref 5–15)
AST: 22 U/L (ref 15–41)
Albumin: 3.2 g/dL — ABNORMAL LOW (ref 3.5–5.0)
Alkaline Phosphatase: 57 U/L (ref 38–126)
BUN: 7 mg/dL (ref 6–20)
CHLORIDE: 103 mmol/L (ref 101–111)
CO2: 20 mmol/L — ABNORMAL LOW (ref 22–32)
CREATININE: 0.37 mg/dL — AB (ref 0.44–1.00)
Calcium: 8.8 mg/dL — ABNORMAL LOW (ref 8.9–10.3)
Glucose, Bld: 112 mg/dL — ABNORMAL HIGH (ref 65–99)
POTASSIUM: 3.2 mmol/L — AB (ref 3.5–5.1)
Sodium: 134 mmol/L — ABNORMAL LOW (ref 135–145)
Total Bilirubin: 0.3 mg/dL (ref 0.3–1.2)
Total Protein: 6.6 g/dL (ref 6.5–8.1)

## 2016-02-26 NOTE — MAU Provider Note (Signed)
History     CSN: [id number]  Arrival date and time: 02/26/16 2125   First Provider Initiated Contact with Patient 02/26/16 2254      Chief Complaint  Patient presents with  . Abdominal Pain   Alexandra Meadows is a 20 y.o. G1P0 at [redacted]w[redacted]d who presents today with abdominal pain. She states that she was in class and did not feel well. She states that they took her blood pressure in class, and it was 158/82. She denies any VB or LOF. She reports normal fetal movement.    Abdominal Pain  This is a new problem. The current episode started in the past 7 days. The onset quality is gradual. The problem occurs constantly. The problem has been unchanged. The pain is located in the generalized abdominal region. The pain is at a severity of 6/10. The quality of the pain is sharp. The abdominal pain does not radiate. Pertinent negatives include no constipation, diarrhea, dysuria, fever, frequency, headaches, nausea or vomiting. Nothing aggravates the pain. Relieved by: warm baths, heating pad or ice.    Past Medical History:  Diagnosis Date  . ADHD (attention deficit hyperactivity disorder)   . Anxiety   . Back pain   . Depression   . Headache(784.0)   . Insomnia   . Metrorrhagia     Past Surgical History:  Procedure Laterality Date  . KNEE ARTHROSCOPY Left 02/28/2014   Procedure: ARTHROSCOPY LEFT KNEE WITH SYNOVECTOMY LIMITED, PARTIAL LATERAL MENISECTOMY;  Surgeon: Loreta Ave, MD;  Location: [facility];  Service: Orthopedics;  Laterality: Left;  . MOUTH SURGERY      History reviewed. No pertinent family history.  Social History  Substance Use Topics  . Smoking status: Never Smoker  . Smokeless tobacco: Never Used     Comment: quit  . Alcohol use No    Allergies:  Allergies  Allergen Reactions  . Manuka Honey     Actual honey- Blisters.   . Tetanus Toxoids Rash    Pt received tetanus shot 02-19-14 and developed a rash on her abdomen    Prescriptions Prior to  Admission  Medication Sig Dispense Refill Last Dose  . acetaminophen (TYLENOL) 500 MG tablet Take 500 mg by mouth every 6 (six) hours as needed for moderate pain or headache.   Taking  . albuterol (PROVENTIL HFA;VENTOLIN HFA) 108 (90 BASE) MCG/ACT inhaler Inhale 2 puffs into the lungs every 4 (four) hours as needed for wheezing or shortness of breath. 1 Inhaler 11 Taking  . Doxylamine-Pyridoxine (DICLEGIS) 10-10 MG TBEC Take 1 tablet by mouth daily. Reported on 11/20/2015   Not Taking  . montelukast (SINGULAIR) 10 MG tablet Reported on 11/20/2015  5 Not Taking  . promethazine (PHENERGAN) 25 MG tablet Take 1 tablet (25 mg total) by mouth every 6 (six) hours as needed for nausea or vomiting. 30 tablet 0 Taking    Review of Systems  Constitutional: Negative for chills and fever.  Eyes: Negative for blurred vision.  Gastrointestinal: Positive for abdominal pain. Negative for constipation, diarrhea, nausea and vomiting.  Genitourinary: Negative for dysuria, frequency and urgency.  Neurological: Negative for headaches.   Physical Exam   Blood pressure 133/83, pulse 102, temperature 98.3 F (36.8 C), temperature source Oral, resp. rate 20, height 5\' 2"  (1.575 m), weight 156 lb (70.8 kg), last menstrual period 07/02/2015.  Physical Exam  Nursing note and vitals reviewed. Constitutional: She is oriented to person, place, and time. She appears well-developed and well-nourished. No distress.  HENT:  Head: Normocephalic.  Cardiovascular: Normal rate.   Respiratory: Effort normal.  GI: Soft. There is no tenderness. There is no rebound.  Genitourinary:  Genitourinary Comments: Closed/thick   Neurological: She is alert and oriented to person, place, and time.  Skin: Skin is warm and dry.  Psychiatric: She has a normal mood and affect.   Results for orders placed or performed during the hospital encounter of 02/26/16 (from the past 24 hour(s))  Urinalysis, Routine w reflex microscopic (not at [facility])      Status: Abnormal   Collection Time: 02/26/16  9:59 PM  Result Value Ref Range   Color, Urine YELLOW YELLOW   APPearance HAZY (A) CLEAR   Specific Gravity, Urine 1.015 1.005 - 1.030   pH 6.0 5.0 - 8.0   Glucose, UA NEGATIVE NEGATIVE mg/dL   Hgb urine dipstick TRACE (A) NEGATIVE   Bilirubin Urine NEGATIVE NEGATIVE   Ketones, ur NEGATIVE NEGATIVE mg/dL   Protein, ur NEGATIVE NEGATIVE mg/dL   Nitrite NEGATIVE NEGATIVE   Leukocytes, UA MODERATE (A) NEGATIVE  Urine microscopic-add on     Status: Abnormal   Collection Time: 02/26/16  9:59 PM  Result Value Ref Range   Squamous Epithelial / LPF 6-30 (A) NONE SEEN   WBC, UA 6-30 0 - 5 WBC/hpf   RBC / HPF 0-5 0 - 5 RBC/hpf   Bacteria, UA FEW (A) NONE SEEN  CBC     Status: Abnormal   Collection Time: 02/26/16 10:43 PM  Result Value Ref Range   WBC 12.8 (H) 4.0 - 10.5 K/uL   RBC 3.91 3.87 - 5.11 MIL/uL   Hemoglobin 11.4 (L) 12.0 - 15.0 g/dL   HCT 21.3 (L) 08.6 - 57.8 %   MCV 84.9 78.0 - 100.0 fL   MCH 29.2 26.0 - 34.0 pg   MCHC 34.3 30.0 - 36.0 g/dL   RDW 46.9 62.9 - 52.8 %   Platelets 254 150 - 400 K/uL  Comprehensive metabolic panel     Status: Abnormal   Collection Time: 02/26/16 10:43 PM  Result Value Ref Range   Sodium 134 (L) 135 - 145 mmol/L   Potassium 3.2 (L) 3.5 - 5.1 mmol/L   Chloride 103 101 - 111 mmol/L   CO2 20 (L) 22 - 32 mmol/L   Glucose, Bld 112 (H) 65 - 99 mg/dL   BUN 7 6 - 20 mg/dL   Creatinine, Ser 4.13 (L) 0.44 - 1.00 mg/dL   Calcium 8.8 (L) 8.9 - 10.3 mg/dL   Total Protein 6.6 6.5 - 8.1 g/dL   Albumin 3.2 (L) 3.5 - 5.0 g/dL   AST 22 15 - 41 U/L   ALT 11 (L) 14 - 54 U/L   Alkaline Phosphatase 57 38 - 126 U/L   Total Bilirubin 0.3 0.3 - 1.2 mg/dL   GFR calc non Af Amer >60 >60 mL/min   GFR calc Af Amer >60 >60 mL/min   Anion gap 11 5 - 15   FHT: 130, moderate with 15x15 accels, no decels Toco: occasional UI  MAU Course  Procedures  MDM 0003: D/W Dr. Vincente Poli ok for DC home.   Assessment and Plan    1. Dizziness   2. [redacted] weeks gestation of pregnancy   3. Round ligament pain    DC home Comfort measures reviewed  3rd Trimester precautions  PTL precautions  Fetal kick counts RX: none  Return to MAU as needed FU with OB as planned  Follow-up Information  Jeani Hawking, MD .   Specialty:  Obstetrics and Gynecology Contact information: [address] [place] [address] [phone]            Tawnya Crook 02/26/2016, 11:01 PM

## 2016-02-26 NOTE — MAU Note (Signed)
PT  SAYS  AT 830PM  TONIGHT   SHE  WAS IN CNA   CLASS  AND  FELT  DIZZY -   SO  TEACHER   TOOK  HER  BP-   156/82.         [facility]-  PHYSICIANS     FOR   WOMEN.          HAS HAD  DIZZY FEELING   BEFORE   .     HAS HAD  PAIN IN   ABD - COMES/ GOES.         NOW   SHE  DOES  NOT   FEEL  AS  BAD.

## 2016-02-27 DIAGNOSIS — N949 Unspecified condition associated with female genital organs and menstrual cycle: Secondary | ICD-10-CM | POA: Diagnosis not present

## 2016-02-27 DIAGNOSIS — O9989 Other specified diseases and conditions complicating pregnancy, childbirth and the puerperium: Secondary | ICD-10-CM

## 2016-02-27 DIAGNOSIS — R42 Dizziness and giddiness: Secondary | ICD-10-CM | POA: Diagnosis not present

## 2016-02-27 DIAGNOSIS — Z3A34 34 weeks gestation of pregnancy: Secondary | ICD-10-CM

## 2016-02-27 DIAGNOSIS — O26893 Other specified pregnancy related conditions, third trimester: Secondary | ICD-10-CM | POA: Diagnosis not present

## 2016-02-27 NOTE — Discharge Instructions (Signed)
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
## 2016-03-10 LAB — OB RESULTS CONSOLE GBS: GBS: POSITIVE

## 2016-04-04 ENCOUNTER — Encounter (HOSPITAL_COMMUNITY): Payer: Self-pay

## 2016-04-04 ENCOUNTER — Inpatient Hospital Stay (HOSPITAL_COMMUNITY)
Admission: AD | Admit: 2016-04-04 | Discharge: 2016-04-04 | Disposition: A | Discharge status: Home / Self Care | Payer: 59 | Source: Ambulatory Visit | Attending: Obstetrics and Gynecology | Admitting: Obstetrics and Gynecology

## 2016-04-04 ENCOUNTER — Inpatient Hospital Stay (HOSPITAL_COMMUNITY)
Admission: AD | Admit: 2016-04-04 | Discharge: 2016-04-06 | DRG: 775 | Disposition: A | Discharge status: Home / Self Care | Payer: 59 | Source: Ambulatory Visit | Attending: Obstetrics and Gynecology | Admitting: Obstetrics and Gynecology

## 2016-04-04 ENCOUNTER — Encounter (HOSPITAL_COMMUNITY): Payer: Self-pay | Admitting: *Deleted

## 2016-04-04 ENCOUNTER — Inpatient Hospital Stay (HOSPITAL_COMMUNITY): Payer: 59 | Admitting: Anesthesiology

## 2016-04-04 DIAGNOSIS — O99824 Streptococcus B carrier state complicating childbirth: Secondary | ICD-10-CM | POA: Diagnosis present

## 2016-04-04 DIAGNOSIS — J45909 Unspecified asthma, uncomplicated: Secondary | ICD-10-CM | POA: Diagnosis present

## 2016-04-04 DIAGNOSIS — IMO0001 Reserved for inherently not codable concepts without codable children: Secondary | ICD-10-CM

## 2016-04-04 DIAGNOSIS — O9952 Diseases of the respiratory system complicating childbirth: Secondary | ICD-10-CM | POA: Diagnosis present

## 2016-04-04 DIAGNOSIS — Z3A39 39 weeks gestation of pregnancy: Secondary | ICD-10-CM | POA: Diagnosis not present

## 2016-04-04 DIAGNOSIS — Z3403 Encounter for supervision of normal first pregnancy, third trimester: Secondary | ICD-10-CM | POA: Diagnosis present

## 2016-04-04 HISTORY — DX: Unspecified asthma, uncomplicated: J45.909

## 2016-04-04 LAB — TYPE AND SCREEN
ABO/RH(D): O POS
ANTIBODY SCREEN: NEGATIVE

## 2016-04-04 LAB — CBC
HEMATOCRIT: 36.9 % (ref 36.0–46.0)
HEMOGLOBIN: 13 g/dL (ref 12.0–15.0)
MCH: 29.6 pg (ref 26.0–34.0)
MCHC: 35.2 g/dL (ref 30.0–36.0)
MCV: 84.1 fL (ref 78.0–100.0)
Platelets: 327 10*3/uL (ref 150–400)
RBC: 4.39 MIL/uL (ref 3.87–5.11)
RDW: 13.7 % (ref 11.5–15.5)
WBC: 17.4 10*3/uL — AB (ref 4.0–10.5)

## 2016-04-04 LAB — ABO/RH: ABO/RH(D): O POS

## 2016-04-04 MED ORDER — ONDANSETRON HCL 4 MG/2ML IJ SOLN: 4.0000 mg | Freq: Once | INTRAMUSCULAR | Status: DC

## 2016-04-04 MED ORDER — OXYCODONE-ACETAMINOPHEN 5-325 MG PO TABS: 1.0000 | ORAL_TABLET | ORAL | Status: DC | PRN

## 2016-04-04 MED ORDER — OXYTOCIN 40 UNITS IN LACTATED RINGERS INFUSION - SIMPLE MED
2.5000 [IU]/h | INTRAVENOUS | Status: DC
  Filled 2016-04-04: qty 1000

## 2016-04-04 MED ORDER — SODIUM CHLORIDE 0.9 % IV SOLN
2.0000 g | Freq: Once | INTRAVENOUS | Status: AC
Start: 2016-04-04 — End: 2016-04-04
  Administered 2016-04-04: 2 g via INTRAVENOUS
  Filled 2016-04-04: qty 2000

## 2016-04-04 MED ORDER — EPHEDRINE 5 MG/ML INJ
10.0000 mg | INTRAVENOUS | Status: DC | PRN
  Filled 2016-04-04: qty 4

## 2016-04-04 MED ORDER — ACETAMINOPHEN 325 MG PO TABS: 650.0000 mg | ORAL_TABLET | ORAL | Status: DC | PRN

## 2016-04-04 MED ORDER — LACTATED RINGERS IV SOLN: INTRAVENOUS | Status: DC

## 2016-04-04 MED ORDER — FLEET ENEMA 7-19 GM/118ML RE ENEM: 1.0000 | ENEMA | RECTAL | Status: DC | PRN

## 2016-04-04 MED ORDER — WITCH HAZEL-GLYCERIN EX PADS: 1.0000 "application " | MEDICATED_PAD | CUTANEOUS | Status: DC | PRN

## 2016-04-04 MED ORDER — FENTANYL 2.5 MCG/ML BUPIVACAINE 1/10 % EPIDURAL INFUSION (WH - ANES): 14.0000 mL/h | INTRAMUSCULAR | Status: DC | PRN

## 2016-04-04 MED ORDER — IBUPROFEN 600 MG PO TABS
600.0000 mg | ORAL_TABLET | Freq: Four times a day (QID) | ORAL | Status: DC
  Administered 2016-04-04 – 2016-04-06 (×6): 600 mg via ORAL
  Filled 2016-04-04 (×7): qty 1

## 2016-04-04 MED ORDER — LIDOCAINE HCL (PF) 1 % IJ SOLN
INTRAMUSCULAR | Status: DC | PRN
  Administered 2016-04-04 (×2): 4 mL

## 2016-04-04 MED ORDER — PHENYLEPHRINE 40 MCG/ML (10ML) SYRINGE FOR IV PUSH (FOR BLOOD PRESSURE SUPPORT)
80.0000 ug | PREFILLED_SYRINGE | INTRAVENOUS | Status: DC | PRN
  Filled 2016-04-04: qty 5

## 2016-04-04 MED ORDER — OXYTOCIN BOLUS FROM INFUSION: 500.0000 mL | Freq: Once | INTRAVENOUS | Status: DC

## 2016-04-04 MED ORDER — DIPHENHYDRAMINE HCL 50 MG/ML IJ SOLN: 12.5000 mg | INTRAMUSCULAR | Status: DC | PRN

## 2016-04-04 MED ORDER — OXYCODONE HCL 5 MG PO TABS
5.0000 mg | ORAL_TABLET | ORAL | Status: DC | PRN
  Administered 2016-04-05: 5 mg via ORAL
  Filled 2016-04-04 (×2): qty 1

## 2016-04-04 MED ORDER — PRENATAL MULTIVITAMIN CH
1.0000 | ORAL_TABLET | Freq: Every day | ORAL | Status: DC
  Administered 2016-04-05 – 2016-04-06 (×2): 1 via ORAL
  Filled 2016-04-04 (×2): qty 1

## 2016-04-04 MED ORDER — FENTANYL 2.5 MCG/ML BUPIVACAINE 1/10 % EPIDURAL INFUSION (WH - ANES)
14.0000 mL/h | INTRAMUSCULAR | Status: DC | PRN
  Administered 2016-04-04: 14 mL/h via EPIDURAL
  Filled 2016-04-04: qty 125

## 2016-04-04 MED ORDER — LIDOCAINE HCL (PF) 1 % IJ SOLN
30.0000 mL | INTRAMUSCULAR | Status: AC | PRN
  Administered 2016-04-04: 30 mL via SUBCUTANEOUS
  Filled 2016-04-04: qty 30

## 2016-04-04 MED ORDER — ONDANSETRON HCL 4 MG PO TABS: 4.0000 mg | ORAL_TABLET | ORAL | Status: DC | PRN

## 2016-04-04 MED ORDER — ACETAMINOPHEN 325 MG PO TABS
650.0000 mg | ORAL_TABLET | ORAL | Status: DC | PRN
  Administered 2016-04-04: 650 mg via ORAL
  Filled 2016-04-04: qty 2

## 2016-04-04 MED ORDER — LACTATED RINGERS IV SOLN: 500.0000 mL | INTRAVENOUS | Status: DC | PRN

## 2016-04-04 MED ORDER — BENZOCAINE-MENTHOL 20-0.5 % EX AERO
1.0000 "application " | INHALATION_SPRAY | CUTANEOUS | Status: DC | PRN
  Filled 2016-04-04: qty 56

## 2016-04-04 MED ORDER — SENNOSIDES-DOCUSATE SODIUM 8.6-50 MG PO TABS
2.0000 | ORAL_TABLET | ORAL | Status: DC
  Administered 2016-04-04 – 2016-04-05 (×2): 2 via ORAL
  Filled 2016-04-04 (×2): qty 2

## 2016-04-04 MED ORDER — DIPHENHYDRAMINE HCL 25 MG PO CAPS
25.0000 mg | ORAL_CAPSULE | Freq: Four times a day (QID) | ORAL | Status: DC | PRN
Start: 2016-04-04 — End: 2016-04-06

## 2016-04-04 MED ORDER — ONDANSETRON HCL 4 MG/2ML IJ SOLN: 4.0000 mg | Freq: Four times a day (QID) | INTRAMUSCULAR | Status: DC | PRN

## 2016-04-04 MED ORDER — SIMETHICONE 80 MG PO CHEW: 80.0000 mg | CHEWABLE_TABLET | ORAL | Status: DC | PRN

## 2016-04-04 MED ORDER — COCONUT OIL OIL
1.0000 "application " | TOPICAL_OIL | Status: DC | PRN
  Administered 2016-04-05: 1 via TOPICAL
  Filled 2016-04-04: qty 120

## 2016-04-04 MED ORDER — LACTATED RINGERS IV SOLN: 500.0000 mL | Freq: Once | INTRAVENOUS | Status: DC

## 2016-04-04 MED ORDER — SOD CITRATE-CITRIC ACID 500-334 MG/5ML PO SOLN
30.0000 mL | ORAL | Status: DC | PRN
  Filled 2016-04-04: qty 15

## 2016-04-04 MED ORDER — PHENYLEPHRINE 40 MCG/ML (10ML) SYRINGE FOR IV PUSH (FOR BLOOD PRESSURE SUPPORT)
80.0000 ug | PREFILLED_SYRINGE | INTRAVENOUS | Status: DC | PRN
Start: 2016-04-04 — End: 2016-04-04
  Filled 2016-04-04: qty 10
  Filled 2016-04-04: qty 5

## 2016-04-04 MED ORDER — OXYCODONE HCL 5 MG PO TABS: 10.0000 mg | ORAL_TABLET | ORAL | Status: DC | PRN

## 2016-04-04 MED ORDER — ALBUTEROL SULFATE (2.5 MG/3ML) 0.083% IN NEBU: 2.5000 mg | INHALATION_SOLUTION | RESPIRATORY_TRACT | Status: DC | PRN

## 2016-04-04 MED ORDER — ZOLPIDEM TARTRATE 5 MG PO TABS: 5.0000 mg | ORAL_TABLET | Freq: Every evening | ORAL | Status: DC | PRN

## 2016-04-04 MED ORDER — OXYCODONE-ACETAMINOPHEN 5-325 MG PO TABS: 2.0000 | ORAL_TABLET | ORAL | Status: DC | PRN

## 2016-04-04 MED ORDER — ONDANSETRON HCL 4 MG/2ML IJ SOLN: 4.0000 mg | INTRAMUSCULAR | Status: DC | PRN

## 2016-04-04 MED ORDER — DIBUCAINE 1 % RE OINT: 1.0000 "application " | TOPICAL_OINTMENT | RECTAL | Status: DC | PRN

## 2016-04-04 NOTE — H&P (Signed)
Alexandra MendsChristina Meadows is a 20 y.o. female presenting for UCs since about 4 am. No ROM. + bloody show. Pregnancy complicated by asthma treated with inhaler prn. OB History    Gravida Para Term Preterm AB Living   1             SAB TAB Ectopic Multiple Live Births           0     Past Medical History:  Diagnosis Date  . ADHD (attention deficit hyperactivity disorder)   . Anxiety   . Asthma   . Back pain   . Depression   . Headache(784.0)   . Insomnia   . Metrorrhagia   . Pyelonephritis   . Seizures (HCC)    Past Surgical History:  Procedure Laterality Date  . KNEE ARTHROSCOPY Left 02/28/2014   Procedure: ARTHROSCOPY LEFT KNEE WITH SYNOVECTOMY LIMITED, PARTIAL LATERAL MENISECTOMY;  Surgeon: Loreta Aveaniel F Murphy, MD;  Location: [facility];  Service: Orthopedics;  Laterality: Left;  . MOUTH SURGERY     Family History: family history is not on file. Social History:  reports that she has never smoked. She has never used smokeless tobacco. She reports that she does not drink alcohol or use drugs.     Maternal Diabetes: No Genetic Screening: Normal Maternal Ultrasounds/Referrals: Normal Fetal Ultrasounds or other Referrals:  None Maternal Substance Abuse:  No Significant Maternal Medications:  Meds include: Other: inhaler prn Significant Maternal Lab Results:  None Other Comments:  None  ROS Maternal Medical History:  Reason for admission: Contractions.   Contractions: Onset was 6-12 hours ago.    Fetal activity: Perceived fetal activity is normal.    Prenatal Complications - Diabetes: none.    Dilation: 6 Effacement (%): 90 Station: -1 Exam by:: Alexandra FramesAmanda Jones RN Blood pressure 142/90, pulse 101, temperature 97.8 F (36.6 C), temperature source Oral, resp. rate 18, last menstrual period 07/02/2015. Maternal Exam:  Uterine Assessment: Contraction strength is firm.  Contraction frequency is regular.   Abdomen: Patient reports no abdominal tenderness. Fetal  presentation: vertex     Fetal Exam Fetal State Assessment: Category I - tracings are normal.     Physical Exam  Cardiovascular: Normal rate and regular rhythm.   Respiratory: Effort normal and breath sounds normal.  GI: Soft.  Neurological: She has normal reflexes.    Cx 8/C/0/vertex BOWI  Prenatal labs: ABO, Rh: O/Positive/-- (02/15 0000) Antibody: Negative (02/15 0000) Rubella: Immune (02/15 0000) RPR: Non Reactive (01/26 1456)  HBsAg: Negative (02/15 0000)  HIV: Non Reactive (01/26 1456)  GBS: Positive (08/16 0000)   Assessment/Plan: 20 yo G1P0 @ 39 4/7 weeks in active labor   Quinnlan Abruzzo II,Adda Stokes E 04/04/2016, 12:26 PM

## 2016-04-04 NOTE — Anesthesia Preprocedure Evaluation (Signed)
Anesthesia Evaluation  Patient identified by MRN, date of birth, ID band Patient awake    Reviewed: Allergy & Precautions, H&P , NPO status , Patient's Chart, lab work & pertinent test results  Airway Mallampati: I  TM Distance: >3 FB Neck ROM: Full    Dental  (+) Teeth Intact, Dental Advisory Given   Pulmonary Current Smoker,    breath sounds clear to auscultation       Cardiovascular  Rhythm:Regular Rate:Normal     Neuro/Psych Anxiety    GI/Hepatic   Endo/Other    Renal/GU Renal disease     Musculoskeletal   Abdominal   Peds  Hematology   Anesthesia Other Findings   Reproductive/Obstetrics                             Anesthesia Physical  Anesthesia Plan  ASA: II  Anesthesia Plan: Epidural   Post-op Pain Management:    Induction:   Airway Management Planned:   Additional Equipment:   Intra-op Plan:   Post-operative Plan:   Informed Consent: I have reviewed the patients History and Physical, chart, labs and discussed the procedure including the risks, benefits and alternatives for the proposed anesthesia with the patient or authorized representative who has indicated his/her understanding and acceptance.     Plan Discussed with:   Anesthesia Plan Comments:         Anesthesia Quick Evaluation

## 2016-04-04 NOTE — Anesthesia Procedure Notes (Signed)
Epidural Patient location during procedure: OB  Staffing Anesthesiologist: Ladeja Pelham Performed: anesthesiologist   Preanesthetic Checklist Completed: patient identified, pre-op evaluation, timeout performed, IV checked, risks and benefits discussed and monitors and equipment checked  Epidural Patient position: sitting Prep: site prepped and draped and DuraPrep Patient monitoring: heart rate Approach: midline Location: L2-L3 Injection technique: LOR air and LOR saline  Needle:  Needle type: Tuohy  Needle gauge: 17 G Needle length: 9 cm Needle insertion depth: 5 cm Catheter type: closed end flexible Catheter size: 19 Gauge Catheter at skin depth: 11 cm Test dose: negative  Assessment Sensory level: T8 Events: blood not aspirated, injection not painful, no injection resistance, negative IV test and no paresthesia  Additional Notes Reason for block:procedure for pain     

## 2016-04-04 NOTE — MAU Note (Signed)
Contractions off and on during the night. States was 3cm/80% in office last week.

## 2016-04-04 NOTE — MAU Note (Signed)
Ctx since this morning

## 2016-04-04 NOTE — Progress Notes (Signed)
Operative Delivery Note At 3:28 PM a viable female was delivered via Vaginal, Vacuum Investment banker, operational(Extractor).  Presentation: vertex; Position: Left,, Occiput,, Posterior; Station: +4.  Verbal consent: obtained from patient.  Risks and benefits discussed in detail.  Risks include, but are not limited to the risks of anesthesia, bleeding, infection, damage to maternal tissues, fetal cephalhematoma.  There is also the risk of inability to effect vaginal delivery of the head, or shoulder dystocia that cannot be resolved by established maneuvers, leading to the need for emergency cesarean section.  Crowning with FHT in 80s. Unable to push baby out and requests help. Foley catheter removed. Mushroom VE applied with no popoffs. Over course of one contraction vertex delivered over second degree MLE.  APGAR:7 ,9 ; weight pending  .   Placenta status:intact , .   Cord: 3 vessels with the following complications:none .  Cord pH: pending  Anesthesia: epidural and local  Instruments: mushroom VE Episiotomy: Median Lacerations: 2nd degree Suture Repair: 2.0 vicryl rapide Est. Blood Loss (mL): 250  Mom to postpartum.  Baby to Couplet care / Skin to Skin.  Jonpaul Lumm II,Jagger Beahm E 04/04/2016, 3:45 PM

## 2016-04-04 NOTE — Discharge Instructions (Signed)
Keep your scheduled appt for prenatal care. Call the office or provider on call with further concerns and questions.

## 2016-04-04 NOTE — Progress Notes (Signed)
Cx 9/C/0/LOT FHT cat one UCs q 2-3 min AROM thin meconium Epidural in

## 2016-04-04 NOTE — Anesthesia Pain Management Evaluation Note (Signed)
  CRNA Pain Management Visit Note  Patient: Alexandra Meadows, 20 y.o., female  "Hello I am a member of the anesthesia team [facility]. We have an anesthesia team available at all times to provide care throughout the hospital, including epidural management and anesthesia for C-section. I don't know your plan for the delivery whether it a natural birth, water birth, IV sedation, nitrous supplementation, doula or epidural, but we want to meet your pain goals."   1.Was your pain managed to your expectations on prior hospitalizations?   No prior hospitalizations  2.What is your expectation for pain management during this hospitalization?     Epidural  3.How can we help you reach that goal? Support as needed.  Record the patient's initial score and the patient's pain goal.   Pain: 3  Pain Goal: 4 [facility] wants you to be able to say your pain was always managed very well.  [facility] CenterWRINKLE,Isaias Dowson 04/04/2016

## 2016-04-05 LAB — CBC
HCT: 30.9 % — ABNORMAL LOW (ref 36.0–46.0)
Hemoglobin: 10.6 g/dL — ABNORMAL LOW (ref 12.0–15.0)
MCH: 29.1 pg (ref 26.0–34.0)
MCHC: 34.3 g/dL (ref 30.0–36.0)
MCV: 84.9 fL (ref 78.0–100.0)
PLATELETS: 251 10*3/uL (ref 150–400)
RBC: 3.64 MIL/uL — AB (ref 3.87–5.11)
RDW: 13.9 % (ref 11.5–15.5)
WBC: 14.8 10*3/uL — AB (ref 4.0–10.5)

## 2016-04-05 LAB — RPR: RPR Ser Ql: NONREACTIVE

## 2016-04-05 NOTE — Progress Notes (Signed)
  Called to room by tech.  Upon entering room, Mother was having a breakdown stating "that she cannot do this, the pump isn't working, her breasts hurt, the baby isn't feeding".  Patient's Mother stood calmly at bedside, and requested "formula".  I ask patient if she wanted to no longer breast feed, and she was educated on "formula" and formula feeding, pro's and con's.  Patient has decided that she just wanted a break for now, so I offered Alimentum for now.  Formula feeding sheet, as well as education thereto provided to patient and Mother.   Breast Care notified of situation .  Additionally, Breast Care was at the bedside earlier this morning.

## 2016-04-05 NOTE — Anesthesia Postprocedure Evaluation (Signed)
Anesthesia Post Note  Patient: Alexandra Meadows  Procedure(s) Performed: * No procedures listed *  Patient location during evaluation: Mother Baby Anesthesia Type: Epidural Level of consciousness: awake and alert and oriented Pain management: satisfactory to patient Vital Signs Assessment: post-procedure vital signs reviewed and stable Respiratory status: spontaneous breathing and nonlabored ventilation Cardiovascular status: stable Postop Assessment: no headache, no backache, no signs of nausea or vomiting, adequate PO intake and patient able to bend at knees (patient up walking) Anesthetic complications: no     Last Vitals:  Vitals:   04/04/16 1850 04/04/16 2345  BP: 137/69 117/63  Pulse: (!) 112 88  Resp: 18 18  Temp: 37.2 C 37.1 C    Last Pain:  Vitals:   04/05/16 0725  TempSrc:   PainSc: 0-No pain   Pain Goal: Patients Stated Pain Goal: 1 (04/05/16 0725)               Madison HickmanGREGORY,Jensyn Cambria

## 2016-04-05 NOTE — Progress Notes (Signed)
Contacted anesthesia.  Patient has increased pain to mid-back site since yesterday, as well as slightly raised area at epidural site.  Pain radiates in an upward direction, described as sharp and constant.

## 2016-04-05 NOTE — Lactation Note (Signed)
This note was copied from a baby's chart. Lactation Consultation Note  Nipples have abrasions on tips.  Mother states nipples are no longer bleeding, still sore. Baby has indention at tip of tongue and short lingual frenulum.  Suggest parents discuss this with Peds MD. She is alternating between gels and shells and applying coconut oil with shells. Mother stated when she has been pumping colostrum is around shield instead in bottle. Suggest she sit up when pumping and lean forward periodically. Baby recently received approx. 4 ml of colostrum w/ curved tip syringe and 20 ml of formula in a bottle. Set up DEBP.  Suggest if she is latching then post pump for 10-15 min w/DEBP 4-6 times a day. If she decides she is too sore to latch baby then she should pump every 3 hours. Give baby volume at next feeding.  Reviewed milk storage and cleaning. Provided mother with #20NS to try instead of #16NS to see if it feels more comfortable. Suggest she call if needs further assistance.   Patient Name: Alexandra Neta MendsChristina Astorga EAVWU'JToday's Date: 04/05/2016     Maternal Data    Feeding    LATCH Score/Interventions                      Lactation Tools Discussed/Used     Consult Status      Alexandra Meadows, Alexandra Meadows [facility] 04/05/2016, 3:53 PM

## 2016-04-05 NOTE — Lactation Note (Signed)
This note was copied from a baby's chart. Lactation Consultation Note New mom has very short shafts nipples, semi flat. After hand expressing colostrum of then mom can latch better. Mom states nipples have bled, especially Rt. Nipple. bilatteral nipples has sore areas in center of nipples. Gave mom shells to wear in bra to evert nipples also to keep nipples from rubbing again clothing. Fitted mom w/#16 NS. Hand expression taught w/easy flow of colostrum noted. Baby fussy wanting to feed. Mom stated baby has been sleepy and wanted to BF. Assisted in football position. Mom not wanting to sit up straight. Encouraged to uses "C" hold to firm breast. Baby frequently popping off and on, screaming when she comes off wanting back on. Mom slightly frustration and baby also. Encouraged to use football. Gave hand pump to pre-pump before applying NS.  Mom encouraged to feed baby 8-12 times/24 hours and with feeding cues. Referred to Baby and Me Book in Breastfeeding section Pg. 22-23 for position options and Proper latch demonstration. Educated about newborn behavior, STS, I&O, cluster feeding, supply and demand. Encouraged comfort during BF so colostrum flows better and mom will enjoy the feeding longer. Taking deep breaths and breast massage during BF. WH/LC brochure given w/resources, support groups and LC services. Patient Name: Girl Neta MendsChristina Hankerson [id number]'s Date: 04/05/2016 Reason for consult: Initial assessment   Maternal Data    Feeding Feeding Type: Breast Fed Length of feed: 20 min (still BF)  LATCH Score/Interventions Latch: Repeated attempts needed to sustain latch, nipple held in mouth throughout feeding, stimulation needed to elicit sucking reflex. Intervention(s): Adjust position;Assist with latch;Breast massage;Breast compression  Audible Swallowing: Spontaneous and intermittent Intervention(s): Skin to skin;Hand expression;Alternate breast massage  Type of Nipple: Everted at rest and  after stimulation (very short shaft) Intervention(s): Shells;Hand pump  Comfort (Breast/Nipple): Engorged, cracked, bleeding, large blisters, severe discomfort Problem noted: Cracked, bleeding, blisters, bruises Intervention(s): Hand pump;Expressed breast milk to nipple  Problem noted: Mild/Moderate discomfort Interventions (Mild/moderate discomfort): Hand massage;Hand expression;Comfort gels;Breast shields;Pre-pump if needed  Hold (Positioning): Assistance needed to correctly position infant at breast and maintain latch. Intervention(s): Breastfeeding basics reviewed;Support Pillows;Skin to skin;Position options  LATCH Score: 6  Lactation Tools Discussed/Used Tools: Shells;Nipple Shields;Pump;Comfort gels Nipple shield size: 16 Shell Type: Inverted Breast pump type: Manual Pump Review: Setup, frequency, and cleaning;Milk Storage Initiated by:: Peri JeffersonL. Burle Kwan RN IBCLC Date initiated:: 04/05/16   Consult Status Consult Status: Follow-up Date: 04/06/16 Follow-up type: In-patient    Glenis Musolf, Diamond NickelLAURA G 04/05/2016, 7:09 AM

## 2016-04-05 NOTE — Progress Notes (Signed)
CLINICAL SOCIAL WORK MATERNAL/CHILD NOTE  Patient Details  Name: Girl Shatyra Becka MRN: [id number] Date of Birth: 04/04/2016  Date:  04/05/2016  Clinical Social Worker Initiating Note:  Lilly Cove, LCSW MSW             Date/ Time Initiated:  04/05/16/1422                   Child's Name:  Alexandra Meadows   Legal Guardian:  Mother   Need for Interpreter:  None   Date of Referral:  04/05/16     Reason for Referral:  Other (Comment) (hx of anxiety and depression)   Referral Source:  RN   Address:     Phone number:      Household Members: Parents   Natural Supports (not living in the home): Immediate Family, Parent   Professional Supports:None   Employment:Unemployed   Type of Work: was in [employer] classes and hoping 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  LCSW met with MOB in room with her mother [facility]) who is very involved due to FOB not being involved.  LCSW explained role, reasons for consult and services while in hospital. MOB confirms dx of anxiety and depression, reports she has been off medications, with a stable mood and does not want resume medications currently. MOB reports a lot of anxiety and depression has been situational and she feels like she is in a good place with positive support.  She processes her emotions regarding her pregnancy, lack of support from FOB and her labor. She reports she felt like it was taking  the baby forever to get her as she was in labor for 5 hours. Reports she is adjusting well to baby, but struggling with breastfeeding reporting her nipples are bleeding and baby is having difficulty latching. MOB reports she is actively working with lactation to assist with breastfeeding.  MOB reports when she discharges she plans to return to her mother's home and live with parents while supporting self and child. At this time she is unemployed, but wants to look for a job come the first of the year. 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 She reports she has all necessary means for the baby at home and has no needs or questions.  LCSW reviewed PPD and PPA with MOB and signs and symptoms to be aware of. MOB was accepting of information and will follow up with OB if needs arise or she desires to go back on her medication.  Baby will [facility] at [facility] in [place].    NO other needs at this time or questions. LCSW is available if needs arise. No barriers to discharge.  CSW Plan/Description: Information/Referral to [employer] , [employer] , No Further Intervention Required/No Barriers to Discharge    Marshell Garfinkel 04/05/2016, 2:24 PM

## 2016-04-05 NOTE — Progress Notes (Signed)
Post Partum Day 1 Subjective: no complaints, up ad lib, voiding and tolerating PO  Objective: Blood pressure 117/63, pulse 88, temperature 98.7 F (37.1 C), resp. rate 18, height 5\' 4"  (1.626 m), weight 162 lb (73.5 kg), last menstrual period 07/02/2015, SpO2 97 %, unknown if currently breastfeeding.  Physical Exam:  General: alert and cooperative Lochia: appropriate Uterine Fundus: firm Incision: healing well DVT Evaluation: No evidence of DVT seen on physical exam. Negative Homan's sign. No cords or calf tenderness. No significant calf/ankle edema.   Recent Labs  04/04/16 1203 04/05/16 0531  HGB 13.0 10.6*  HCT 36.9 30.9*    Assessment/Plan: Plan for discharge tomorrow   LOS: 1 day   Cass Edinger G 04/05/2016, 8:21 AM

## 2016-04-05 NOTE — Progress Notes (Signed)
Plan for epidural site:  Intermittent ice pack and heat pack. Monitor.

## 2016-04-05 NOTE — Progress Notes (Signed)
MD at bedside for spine assessment.

## 2016-04-06 MED ORDER — PRENATAL MULTIVITAMIN CH: 1.0000 | ORAL_TABLET | Freq: Every day | ORAL | 6 refills | Status: DC

## 2016-04-06 MED ORDER — OXYCODONE HCL 5 MG PO TABS: 5.0000 mg | ORAL_TABLET | ORAL | 0 refills | Status: DC | PRN

## 2016-04-06 MED ORDER — IBUPROFEN 600 MG PO TABS: 600.0000 mg | ORAL_TABLET | Freq: Four times a day (QID) | ORAL | 1 refills | Status: DC

## 2016-04-06 NOTE — Progress Notes (Signed)
Post Partum Day 2 Subjective: no complaints, up ad lib, voiding, tolerating PO and + flatus  Objective: Blood pressure (!) 135/56, pulse 89, temperature 97.7 F (36.5 C), temperature source Oral, resp. rate 18, height 5\' 4"  (1.626 m), weight 162 lb (73.5 kg), last menstrual period 07/02/2015, SpO2 97 %, unknown if currently breastfeeding.  Physical Exam:  General: alert and cooperative Lochia: appropriate Uterine Fundus: firm Incision: healing well DVT Evaluation: No evidence of DVT seen on physical exam. Negative Homan's sign. No cords or calf tenderness. No significant calf/ankle edema.   Recent Labs  04/04/16 1203 04/05/16 0531  HGB 13.0 10.6*  HCT 36.9 30.9*    Assessment/Plan: Discharge home   LOS: 2 days   CURTIS,CAROL G 04/06/2016, 8:36 AM

## 2016-04-06 NOTE — Progress Notes (Signed)
Completing rounds on patient when she states she is sore and having some mild pain. I offered her the motrin she declined at midnight. She states she doesn't need one because she just took her motrin from earlier in the day. She just held onto the dose administered at 1740. I told her that she is not allowed to hold her meds at the bedside and must take them when we administer them. If she does not want the medication when we bring it, we can hold onto it when she does. She verbalizes her understanding. 0[facility]

## 2016-04-06 NOTE — Lactation Note (Signed)
This note was copied from a baby's chart. Lactation Consultation Note: mom reports she has decided to just give formula because her nipples are so sore and she is getting stressed out. Reports breasts are feeling fuller this morning. Reviewed engorgement prevention and treatment. States she may pump some and bottle feed EBM. Has manual pump for home. No questions at present. To call prn  Patient Name: Alexandra Meadows ZOXWR'UToday's Date: 04/06/2016 Reason for consult: Follow-up assessment   Maternal Data Formula Feeding for Exclusion: No Has patient been taught Hand Expression?: Yes Does the patient have breastfeeding experience prior to this delivery?: No  Feeding Feeding Type: Bottle Fed - Formula Nipple Type: Slow - flow  LATCH Score/Interventions             Problem noted: Mild/Moderate discomfort Interventions (Mild/moderate discomfort): Comfort gels;Hand expression (coconut oil)        Lactation Tools Discussed/Used Tools: Shells;Nipple Shields;Comfort gels Nipple shield size: 20 WIC Program: No Pump Review: Setup, frequency, and cleaning Initiated by:: RN   Consult Status Consult Status: Complete    Pamelia HoitWeeks, Fabion Gatson D 04/06/2016, 9:09 AM

## 2016-04-06 NOTE — Discharge Summary (Signed)
Obstetric Discharge Summary Reason for Admission: onset of labor Prenatal Procedures: ultrasound Intrapartum Procedures: vacuum Postpartum Procedures: none Complications-Operative and Postpartum: 2 degree perineal laceration Hemoglobin  Date Value Ref Range Status  04/05/2016 10.6 (L) 12.0 - 15.0 g/dL Final   HCT  Date Value Ref Range Status  04/05/2016 30.9 (L) 36.0 - 46.0 % Final    Physical Exam:  General: alert and cooperative Lochia: appropriate Uterine Fundus: firm Incision: healing well DVT Evaluation: No evidence of DVT seen on physical exam. Negative Homan's sign.  Discharge Diagnoses: Term Pregnancy-delivered  Discharge Information: Date: 04/06/2016 Activity: pelvic rest Diet: routine Medications: PNV, Ibuprofen and Percocet Condition: stable Instructions: refer to practice specific booklet Discharge to: home   Newborn Data: Live born female  Birth Weight: 6 lb 15.8 oz (3170 g) APGAR: 7, 9  Home with mother.  CURTIS,CAROL G 04/06/2016, 8:39 AM

## 2016-04-09 ENCOUNTER — Inpatient Hospital Stay (HOSPITAL_COMMUNITY): Admission: RE | Admit: 2016-04-09 | Payer: 59 | Source: Ambulatory Visit

## 2016-04-25 DIAGNOSIS — J351 Hypertrophy of tonsils: Secondary | ICD-10-CM

## 2016-04-25 HISTORY — DX: Hypertrophy of tonsils: J35.1

## 2016-05-04 ENCOUNTER — Encounter: Payer: Self-pay | Admitting: Family Medicine

## 2016-05-04 ENCOUNTER — Ambulatory Visit (INDEPENDENT_AMBULATORY_CARE_PROVIDER_SITE_OTHER): Payer: 59 | Admitting: Family Medicine

## 2016-05-04 VITALS — BP 96/62 | HR 73 | Temp 98.2°F | Ht 64.0 in | Wt 147.0 lb

## 2016-05-04 DIAGNOSIS — M545 Low back pain, unspecified: Secondary | ICD-10-CM

## 2016-05-04 DIAGNOSIS — R5383 Other fatigue: Secondary | ICD-10-CM | POA: Diagnosis not present

## 2016-05-04 DIAGNOSIS — R200 Anesthesia of skin: Secondary | ICD-10-CM

## 2016-05-04 DIAGNOSIS — R202 Paresthesia of skin: Secondary | ICD-10-CM | POA: Diagnosis not present

## 2016-05-04 LAB — CBC WITH DIFFERENTIAL/PLATELET
BASOS ABS: 0 10*3/uL (ref 0.0–0.1)
BASOS PCT: 0.4 % (ref 0.0–3.0)
EOS ABS: 0.3 10*3/uL (ref 0.0–0.7)
Eosinophils Relative: 3.5 % (ref 0.0–5.0)
HEMATOCRIT: 37.9 % (ref 36.0–46.0)
Hemoglobin: 12.8 g/dL (ref 12.0–15.0)
LYMPHS ABS: 4.1 10*3/uL — AB (ref 0.7–4.0)
Lymphocytes Relative: 43.1 % (ref 12.0–46.0)
MCHC: 33.6 g/dL (ref 30.0–36.0)
MCV: 86.1 fl (ref 78.0–100.0)
MONOS PCT: 4.1 % (ref 3.0–12.0)
Monocytes Absolute: 0.4 10*3/uL (ref 0.1–1.0)
NEUTROS ABS: 4.7 10*3/uL (ref 1.4–7.7)
NEUTROS PCT: 48.9 % (ref 43.0–77.0)
PLATELETS: 337 10*3/uL (ref 150.0–400.0)
RBC: 4.4 Mil/uL (ref 3.87–5.11)
RDW: 13.7 % (ref 11.5–14.6)
WBC: 9.5 10*3/uL (ref 4.5–10.5)

## 2016-05-04 LAB — BASIC METABOLIC PANEL
BUN: 14 mg/dL (ref 6–23)
CALCIUM: 9.5 mg/dL (ref 8.4–10.5)
CHLORIDE: 104 meq/L (ref 96–112)
CO2: 31 meq/L (ref 19–32)
CREATININE: 0.6 mg/dL (ref 0.40–1.20)
GFR: 134.78 mL/min (ref >60.00)
GLUCOSE: 93 mg/dL (ref 70–99)
Potassium: 4 mEq/L (ref 3.5–5.1)
Sodium: 143 mEq/L (ref 135–145)

## 2016-05-04 LAB — TSH: TSH: 0.86 u[IU]/mL (ref 0.35–5.50)

## 2016-05-04 LAB — VITAMIN B12: Vitamin B-12: 320 pg/mL (ref 211–911)

## 2016-05-04 NOTE — Progress Notes (Signed)
   Subjective:    Patient ID: Alexandra Meadows, female    DOB: 12/07/1995, 20 y.o.   MRN: [id number]  HPI Here complaining of fatigue, lower back pain, and numbness and tingling in both hands and both feet. She gave birth to her daughter 4 weeks ago after 5 hours of labor. She had an epidural placed in the lower spine during this time. The site of the epidural has remained very tender ever since then. She is breast feeding, and she is still taking prenatal vitamins. She was anemic through the entire pregnancy. She has been a vegetarian for the past 8 years.    Review of Systems  Constitutional: Positive for fatigue.  Respiratory: Negative.   Cardiovascular: Negative.   Gastrointestinal: Negative.   Musculoskeletal: Positive for back pain.  Neurological: Positive for numbness. Negative for weakness.       Objective:   Physical Exam  Constitutional: She is oriented to person, place, and time. She appears well-developed and well-nourished. No distress.  Neck: No thyromegaly present.  Cardiovascular: Normal rate, regular rhythm, normal heart sounds and intact distal pulses.   Pulmonary/Chest: Effort normal and breath sounds normal.  Musculoskeletal: She exhibits no edema.  Tender over the lower spine, ROM is full   Lymphadenopathy:    She has no cervical adenopathy.  Neurological: She is alert and oriented to person, place, and time.          Assessment & Plan:  She has had fatigue for the past month, and of course giving birth and breast feeding has contributed to this. She has gotten little sleep since she cares for the baby by herself. The father is not around. She has some soreness at the epidural area and this should resolve in the next 4-8 weeks. We will get labs today to check her Hgb, B12, glucose, etc.

## 2016-05-04 NOTE — Progress Notes (Signed)
Pre visit review using our clinic review tool, if applicable. No additional management support is needed unless otherwise documented below in the visit note. 

## 2016-05-11 ENCOUNTER — Other Ambulatory Visit: Payer: Self-pay | Admitting: Family Medicine

## 2016-05-11 MED ORDER — POTASSIUM CHLORIDE ER 10 MEQ PO TBCR: 10.0000 meq | EXTENDED_RELEASE_TABLET | Freq: Two times a day (BID) | ORAL | 5 refills | Status: DC

## 2016-05-12 ENCOUNTER — Other Ambulatory Visit: Payer: Self-pay | Admitting: Otolaryngology

## 2016-05-12 ENCOUNTER — Ambulatory Visit (INDEPENDENT_AMBULATORY_CARE_PROVIDER_SITE_OTHER): Payer: 59 | Admitting: Psychology

## 2016-05-12 DIAGNOSIS — F4320 Adjustment disorder, unspecified: Secondary | ICD-10-CM | POA: Diagnosis not present

## 2016-05-18 ENCOUNTER — Encounter (HOSPITAL_BASED_OUTPATIENT_CLINIC_OR_DEPARTMENT_OTHER): Payer: Self-pay | Admitting: *Deleted

## 2016-05-18 NOTE — Pre-Procedure Instructions (Signed)
Pt. instructed to pump and discard breast milk x 24 hours post-op

## 2016-05-19 ENCOUNTER — Ambulatory Visit: Payer: Self-pay | Admitting: Psychology

## 2016-05-25 ENCOUNTER — Encounter (HOSPITAL_BASED_OUTPATIENT_CLINIC_OR_DEPARTMENT_OTHER)
Admission: RE | Disposition: A | Discharge status: Home / Self Care | Payer: Self-pay | Source: Ambulatory Visit | Attending: Otolaryngology

## 2016-05-25 ENCOUNTER — Ambulatory Visit (HOSPITAL_BASED_OUTPATIENT_CLINIC_OR_DEPARTMENT_OTHER)
Admission: RE | Admit: 2016-05-25 | Discharge: 2016-05-25 | Disposition: A | Discharge status: Home / Self Care | Payer: 59 | Source: Ambulatory Visit | Attending: Otolaryngology | Admitting: Otolaryngology

## 2016-05-25 ENCOUNTER — Ambulatory Visit (HOSPITAL_BASED_OUTPATIENT_CLINIC_OR_DEPARTMENT_OTHER): Payer: 59 | Admitting: Anesthesiology

## 2016-05-25 ENCOUNTER — Encounter (HOSPITAL_BASED_OUTPATIENT_CLINIC_OR_DEPARTMENT_OTHER): Payer: Self-pay | Admitting: *Deleted

## 2016-05-25 DIAGNOSIS — J353 Hypertrophy of tonsils with hypertrophy of adenoids: Secondary | ICD-10-CM | POA: Insufficient documentation

## 2016-05-25 DIAGNOSIS — J3501 Chronic tonsillitis: Secondary | ICD-10-CM | POA: Insufficient documentation

## 2016-05-25 DIAGNOSIS — J45909 Unspecified asthma, uncomplicated: Secondary | ICD-10-CM | POA: Diagnosis not present

## 2016-05-25 HISTORY — DX: Hypertrophy of tonsils: J35.1

## 2016-05-25 HISTORY — DX: Other seasonal allergic rhinitis: J30.2

## 2016-05-25 HISTORY — DX: Migraine, unspecified, not intractable, without status migrainosus: G43.909

## 2016-05-25 HISTORY — DX: Personal history of other specified conditions: Z87.898

## 2016-05-25 HISTORY — PX: TONSILLECTOMY AND ADENOIDECTOMY: SHX28

## 2016-05-25 SURGERY — TONSILLECTOMY AND ADENOIDECTOMY
Anesthesia: General | Site: Throat | Laterality: Bilateral

## 2016-05-25 MED ORDER — OXYCODONE HCL 5 MG/5ML PO SOLN
5.0000 mg | Freq: Once | ORAL | Status: AC | PRN
  Administered 2016-05-25: 5 mg via ORAL

## 2016-05-25 MED ORDER — LIDOCAINE 2% (20 MG/ML) 5 ML SYRINGE
INTRAMUSCULAR | Status: AC
  Filled 2016-05-25: qty 5

## 2016-05-25 MED ORDER — FENTANYL CITRATE (PF) 100 MCG/2ML IJ SOLN
INTRAMUSCULAR | Status: AC
  Filled 2016-05-25: qty 2

## 2016-05-25 MED ORDER — ONDANSETRON 8 MG PO TBDP
8.0000 mg | ORAL_TABLET | Freq: Once | ORAL | Status: AC
  Administered 2016-05-25: 8 mg via ORAL

## 2016-05-25 MED ORDER — SODIUM CHLORIDE 0.9 % IR SOLN
Status: DC | PRN
  Administered 2016-05-25: 500 mL

## 2016-05-25 MED ORDER — MIDAZOLAM HCL 2 MG/2ML IJ SOLN
INTRAMUSCULAR | Status: AC
  Filled 2016-05-25: qty 2

## 2016-05-25 MED ORDER — DEXAMETHASONE SODIUM PHOSPHATE 10 MG/ML IJ SOLN
INTRAMUSCULAR | Status: AC
  Filled 2016-05-25: qty 1

## 2016-05-25 MED ORDER — MEPERIDINE HCL 25 MG/ML IJ SOLN: 6.2500 mg | INTRAMUSCULAR | Status: DC | PRN

## 2016-05-25 MED ORDER — OXYCODONE HCL 5 MG/5ML PO SOLN: 5.0000 mg | ORAL | 0 refills | Status: DC | PRN

## 2016-05-25 MED ORDER — DEXAMETHASONE SODIUM PHOSPHATE 4 MG/ML IJ SOLN
INTRAMUSCULAR | Status: DC | PRN
  Administered 2016-05-25: 10 mg via INTRAVENOUS

## 2016-05-25 MED ORDER — OXYCODONE HCL 5 MG PO TABS: 5.0000 mg | ORAL_TABLET | Freq: Once | ORAL | Status: AC | PRN

## 2016-05-25 MED ORDER — BACITRACIN ZINC 500 UNIT/GM EX OINT
TOPICAL_OINTMENT | CUTANEOUS | Status: AC
  Filled 2016-05-25: qty 0.9

## 2016-05-25 MED ORDER — OXYMETAZOLINE HCL 0.05 % NA SOLN
NASAL | Status: DC | PRN
  Administered 2016-05-25: 1 via TOPICAL

## 2016-05-25 MED ORDER — SUCCINYLCHOLINE CHLORIDE 200 MG/10ML IV SOSY
PREFILLED_SYRINGE | INTRAVENOUS | Status: AC
  Filled 2016-05-25: qty 10

## 2016-05-25 MED ORDER — LACTATED RINGERS IV SOLN
INTRAVENOUS | Status: DC
  Administered 2016-05-25 (×2): via INTRAVENOUS
  Administered 2016-05-25: 10 mL/h via INTRAVENOUS

## 2016-05-25 MED ORDER — HYDROMORPHONE HCL 1 MG/ML IJ SOLN
0.2500 mg | INTRAMUSCULAR | Status: DC | PRN
  Administered 2016-05-25: 0.25 mg via INTRAVENOUS
  Administered 2016-05-25: 0.5 mg via INTRAVENOUS

## 2016-05-25 MED ORDER — GLYCOPYRROLATE 0.2 MG/ML IJ SOLN: 0.2000 mg | Freq: Once | INTRAMUSCULAR | Status: DC | PRN

## 2016-05-25 MED ORDER — ONDANSETRON 4 MG PO TBDP
ORAL_TABLET | ORAL | Status: AC
  Filled 2016-05-25: qty 2

## 2016-05-25 MED ORDER — ONDANSETRON HCL 4 MG/2ML IJ SOLN: 4.0000 mg | Freq: Once | INTRAMUSCULAR | Status: DC | PRN

## 2016-05-25 MED ORDER — MIDAZOLAM HCL 2 MG/2ML IJ SOLN
1.0000 mg | INTRAMUSCULAR | Status: DC | PRN
  Administered 2016-05-25: 2 mg via INTRAVENOUS

## 2016-05-25 MED ORDER — PROPOFOL 10 MG/ML IV BOLUS
INTRAVENOUS | Status: DC | PRN
  Administered 2016-05-25: 200 mg via INTRAVENOUS

## 2016-05-25 MED ORDER — FENTANYL CITRATE (PF) 100 MCG/2ML IJ SOLN
50.0000 ug | INTRAMUSCULAR | Status: AC | PRN
  Administered 2016-05-25: 50 ug via INTRAVENOUS
  Administered 2016-05-25: 100 ug via INTRAVENOUS
  Administered 2016-05-25: 50 ug via INTRAVENOUS

## 2016-05-25 MED ORDER — SCOPOLAMINE 1 MG/3DAYS TD PT72: 1.0000 | MEDICATED_PATCH | Freq: Once | TRANSDERMAL | Status: DC | PRN

## 2016-05-25 MED ORDER — SUCCINYLCHOLINE CHLORIDE 20 MG/ML IJ SOLN
INTRAMUSCULAR | Status: DC | PRN
  Administered 2016-05-25: 100 mg via INTRAVENOUS

## 2016-05-25 MED ORDER — HYDROMORPHONE HCL 1 MG/ML IJ SOLN
INTRAMUSCULAR | Status: AC
  Filled 2016-05-25: qty 1

## 2016-05-25 MED ORDER — OXYCODONE HCL 5 MG/5ML PO SOLN
ORAL | Status: AC
  Filled 2016-05-25: qty 5

## 2016-05-25 MED ORDER — AMOXICILLIN 400 MG/5ML PO SUSR: 800.0000 mg | Freq: Two times a day (BID) | ORAL | 0 refills | Status: AC

## 2016-05-25 MED ORDER — ONDANSETRON HCL 4 MG/2ML IJ SOLN
INTRAMUSCULAR | Status: AC
  Filled 2016-05-25: qty 2

## 2016-05-25 MED ORDER — ONDANSETRON HCL 4 MG/2ML IJ SOLN
INTRAMUSCULAR | Status: DC | PRN
  Administered 2016-05-25: 4 mg via INTRAVENOUS

## 2016-05-25 SURGICAL SUPPLY — 29 items
BANDAGE COBAN STERILE 2 (GAUZE/BANDAGES/DRESSINGS) IMPLANT
CANISTER SUCT 1200ML W/VALVE (MISCELLANEOUS) ×3 IMPLANT
CATH ROBINSON RED A/P 10FR (CATHETERS) IMPLANT
CATH ROBINSON RED A/P 14FR (CATHETERS) ×2 IMPLANT
COAGULATOR SUCT SWTCH 10FR 6 (ELECTROSURGICAL) IMPLANT
COVER MAYO STAND STRL (DRAPES) ×3 IMPLANT
ELECT REM PT RETURN 9FT ADLT (ELECTROSURGICAL) ×3
ELECT REM PT RETURN 9FT PED (ELECTROSURGICAL)
ELECTRODE REM PT RETRN 9FT PED (ELECTROSURGICAL) IMPLANT
ELECTRODE REM PT RTRN 9FT ADLT (ELECTROSURGICAL) ×1 IMPLANT
GLOVE BIO SURGEON STRL SZ7.5 (GLOVE) ×3 IMPLANT
GLOVE SURG SS PI 7.0 STRL IVOR (GLOVE) ×2 IMPLANT
GOWN STRL REUS W/ TWL LRG LVL3 (GOWN DISPOSABLE) ×4 IMPLANT
GOWN STRL REUS W/TWL LRG LVL3 (GOWN DISPOSABLE) ×6
IV NS 500ML (IV SOLUTION) ×3
IV NS 500ML BAXH (IV SOLUTION) ×2 IMPLANT
MARKER SKIN DUAL TIP RULER LAB (MISCELLANEOUS) IMPLANT
NS IRRIG 1000ML POUR BTL (IV SOLUTION) ×2 IMPLANT
SHEET MEDIUM DRAPE 40X70 STRL (DRAPES) ×3 IMPLANT
SOLUTION BUTLER CLEAR DIP (MISCELLANEOUS) ×3 IMPLANT
SPONGE GAUZE 4X4 12PLY STER LF (GAUZE/BANDAGES/DRESSINGS) ×3 IMPLANT
SPONGE TONSIL 1 RF SGL (DISPOSABLE) IMPLANT
SPONGE TONSIL 1.25 RF SGL STRG (GAUZE/BANDAGES/DRESSINGS) ×2 IMPLANT
SYR BULB 3OZ (MISCELLANEOUS) IMPLANT
TOWEL OR 17X24 6PK STRL BLUE (TOWEL DISPOSABLE) ×3 IMPLANT
TUBE CONNECTING 20X1/4 (TUBING) ×3 IMPLANT
TUBE SALEM SUMP 12R W/ARV (TUBING) IMPLANT
TUBE SALEM SUMP 16 FR W/ARV (TUBING) ×2 IMPLANT
WAND COBLATOR 70 EVAC XTRA (SURGICAL WAND) ×2 IMPLANT

## 2016-05-25 NOTE — Anesthesia Preprocedure Evaluation (Signed)
Anesthesia Evaluation  Patient identified by MRN, date of birth, ID band Patient awake    Reviewed: Allergy & Precautions, NPO status , Patient's Chart, lab work & pertinent test results  Airway Mallampati: I  TM Distance: >3 FB Neck ROM: Full    Dental  (+) Teeth Intact, Dental Advisory Given   Pulmonary  breath sounds clear to auscultation        Cardiovascular Rhythm:Regular Rate:Normal     Neuro/Psych    GI/Hepatic   Endo/Other    Renal/GU      Musculoskeletal   Abdominal   Peds  Hematology   Anesthesia Other Findings   Reproductive/Obstetrics                             Anesthesia Physical Anesthesia Plan  ASA: II  Anesthesia Plan: General   Post-op Pain Management:    Induction: Intravenous  Airway Management Planned: Oral ETT  Additional Equipment:   Intra-op Plan:   Post-operative Plan: Extubation in OR  Informed Consent: I have reviewed the patients History and Physical, chart, labs and discussed the procedure including the risks, benefits and alternatives for the proposed anesthesia with the patient or authorized representative who has indicated his/her understanding and acceptance.   Dental advisory given  Plan Discussed with: CRNA, Anesthesiologist and Surgeon  Anesthesia Plan Comments:         Anesthesia Quick Evaluation  

## 2016-05-25 NOTE — Anesthesia Postprocedure Evaluation (Signed)
Anesthesia Post Note  Patient: Alexandra Meadows  Procedure(s) Performed: Procedure(s) (LRB): TONSILLECTOMY AND ADENOIDECTOMY (Bilateral)  Patient location during evaluation: PACU Anesthesia Type: General Level of consciousness: awake and alert Pain management: pain level controlled Vital Signs Assessment: post-procedure vital signs reviewed and stable Respiratory status: spontaneous breathing, nonlabored ventilation and respiratory function stable Cardiovascular status: blood pressure returned to baseline and stable Postop Assessment: no signs of nausea or vomiting Anesthetic complications: no    Last Vitals:  Vitals:   05/25/16 1100 05/25/16 1230  BP: 110/70 (!) 114/55  Pulse: 80 64  Resp:  18  Temp:  36.3 C    Last Pain:  Vitals:   05/25/16 1230  TempSrc:   PainSc: 4                  Marielle Mantione A

## 2016-05-25 NOTE — Transfer of Care (Signed)
Immediate Anesthesia Transfer of Care Note  Patient: Alexandra Meadows  Procedure(s) Performed: Procedure(s): TONSILLECTOMY AND ADENOIDECTOMY (Bilateral)  Patient Location: PACU  Anesthesia Type:General  Level of Consciousness: awake and alert   Airway & Oxygen Therapy: Patient Spontanous Breathing and Patient connected to face mask oxygen  Post-op Assessment: Report given to RN and Post -op Vital signs reviewed and stable  Post vital signs: Reviewed and stable  Last Vitals:  Vitals:   05/25/16 0821 05/25/16 0954  BP: 108/68 134/80  Pulse: 64 (!) 115  Resp: 18 14  Temp: 36.8 C     Last Pain:  Vitals:   05/25/16 0821  TempSrc: Oral         Complications: No apparent anesthesia complications

## 2016-05-25 NOTE — Anesthesia Procedure Notes (Signed)
Procedure Name: Intubation Date/Time: 05/25/2016 9:18 AM Performed by: Caren MacadamARTER, Almando Brawley W Pre-anesthesia Checklist: Patient identified, Emergency Drugs available, Suction available and Patient being monitored Patient Re-evaluated:Patient Re-evaluated prior to inductionOxygen Delivery Method: Circle system utilized Preoxygenation: Pre-oxygenation with 100% oxygen Intubation Type: IV induction Ventilation: Mask ventilation without difficulty Laryngoscope Size: Miller and 2 Grade View: Grade I Tube type: Oral Tube size: 7.0 mm Number of attempts: 1 Airway Equipment and Method: Stylet and Oral airway Placement Confirmation: ETT inserted through vocal cords under direct vision,  positive ETCO2 and breath sounds checked- equal and bilateral Secured at: 22 cm Tube secured with: Tape Dental Injury: Teeth and Oropharynx as per pre-operative assessment

## 2016-05-25 NOTE — Discharge Instructions (Addendum)
[facility] °Postoperative Instructions for Tonsillectomy & Adenoidectomy (T&A) °Activity °Restrict activity at home for the first two days, resting as much as possible. Light indoor activity is best. You may usually return to school or work within a week but void strenuous activity and sports for two weeks. Sleep with your head elevated on 2-3 pillows for 3-4 days to help decrease swelling. °Diet °Due to tissue swelling and throat discomfort, you may have little desire to drink for several days. However fluids are very important to prevent dehydration. You will find that non-acidic juices, soups, popsicles, Jell-O, custard, puddings, and any soft or mashed foods taken in small quantities can be swallowed fairly easily. Try to increase your fluid and food intake as the discomfort subsides. It is recommended that a child receive 1-1/2 quarts of fluid in a 24-hour period. Adult require twice this amount.  °Discomfort °Your sore throat may be relieved by applying an ice collar to your neck and/or by taking Tylenol®. You may experience an earache, which is due to referred pain from the throat. Referred ear pain is commonly felt at night when trying to rest. ° °Bleeding                        Although rare, there is risk of having some bleeding during the first 2 weeks after having a T&A. This usually happens between days 7-10 postoperatively. If you or your child should have any bleeding, try to remain calm. We recommend sitting up quietly in a chair and gently spitting out the blood into a bowl. For adults, gargling gently with ice water may help. If the bleeding does not stop after a short time (5 minutes), is more than 1 teaspoonful, or if you become worried, please call our office at [phone] or go directly to the nearest hospital emergency room. Do not eat or drink anything prior to going to the hospital as you may need to be taken to the operating room in order to control the bleeding. °GENERAL  CONSIDERATIONS °1. Brush your teeth regularly. Avoid mouthwashes and gargles for three weeks. You may gargle gently with warm salt-water as necessary or spray with Chloraseptic®. You may make salt-water by placing 2 teaspoons of table salt into a quart of fresh water. Warm the salt-water in a microwave to a luke warm temperature.  °2. Avoid exposure to colds and upper respiratory infections if possible.  °3. If you look into a mirror or into your child's mouth, you will see white-gray patches in the back of the throat. This is normal after having a T&A and is like a scab that forms on the skin after an abrasion. It will disappear once the back of the throat heals completely. However, it may cause a noticeable odor; this too will disappear with time. Again, warm salt-water gargles may be used to help keep the throat clean and promote healing.  °4. You may notice a temporary change in voice quality, such as a higher pitched voice or a nasal sound, until healing is complete. This may last for 1-2 weeks and should resolve.  °5. Do not take or give you child any medications that we have not prescribed or recommended.  °6. Snoring may occur, especially at night, for the first week after a T&A. It is due to swelling of the soft palate and will usually resolve.  °Please call our office at [phone] if you have any questions.   ° ° ° °  Post Anesthesia Home Care Instructions ° °Activity: °Get plenty of rest for the remainder of the day. A responsible adult should stay with you for 24 hours following the procedure.  °For the next 24 hours, DO NOT: °-Drive a car °-Operate machinery °-Drink alcoholic beverages °-Take any medication unless instructed by your physician °-Make any legal decisions or sign important papers. ° °Meals: °Start with liquid foods such as gelatin or soup. Progress to regular foods as tolerated. Avoid greasy, spicy, heavy foods. If nausea and/or vomiting occur, drink only clear liquids until the nausea  and/or vomiting subsides. Call your physician if vomiting continues. ° °Special Instructions/Symptoms: °Your throat may feel dry or sore from the anesthesia or the breathing tube placed in your throat during surgery. If this causes discomfort, gargle with warm salt water. The discomfort should disappear within 24 hours. ° °If you had a scopolamine patch placed behind your ear for the management of post- operative nausea and/or vomiting: ° °1. The medication in the patch is effective for 72 hours, after which it should be removed.  Wrap patch in a tissue and discard in the trash. Wash hands thoroughly with soap and water. °2. You may remove the patch earlier than 72 hours if you experience unpleasant side effects which may include dry mouth, dizziness or visual disturbances. °3. Avoid touching the patch. Wash your hands with soap and water after contact with the patch. °  ° °

## 2016-05-25 NOTE — H&P (Signed)
Cc: Tonsil stones, swollen tonsils  HPI: The patient is a 20 y/o female who presents today for evaluation of frequent tonsil stones and recurrent tonsillar swelling since pregnancy. The patient just delivered her baby one week ago but is still having persistent tonsillar symptoms. She complains of frequent tonsil stones and halitosis with throat irritation and swelling. The patient has tried using saline irrigation without any significant improvement. She did not have any issues with her tonsils prior to pregnancy. The patient is currently eating and drinking without difficulty. No previous ENT surgery is noted.   The patient's review of systems (constitutional, eyes, ENT, cardiovascular, respiratory, GI, musculoskeletal, skin, neurologic, psychiatric, endocrine, hematologic, allergic) is noted in the ROS questionnaire.  It is reviewed with the patient.   Family health history: Diabetes.   Major events: Knee surgery, wisdom teeth extraction.   Ongoing medical problems: Asthma, allergies.   Social history: The patient is single. She denies the use of tobacco, alcohol or illegal drugs.  Exam: General: Communicates without difficulty, well nourished, no acute distress. Head: Normocephalic, no evidence injury, no tenderness, facial buttresses intact without stepoff. Eyes: PERRL, EOMI.  No scleral icterus, conjunctivae clear. Ears: External auditory canals clear bilaterally.  There is no edema or erythema.  Tympanic membrane is within normal limits bilaterally. Nose: Normal skin and external support.  Anterior rhinoscopy reveals healthy pink mucosa over the septum and turbinates.  No lesions or polyps were seen. Oral cavity: Lips without lesions, oral mucosa moist, no masses or lesions seen. Tonsils 2+, cryptic. Pharynx: Clear, no erythema. Neck: Supple, full range of motion, no lymphadenopathy, no masses palpable. Salivary: Parotid and submandibular glands without mass. Neuro:  CN 2-12 grossly intact.  Gait normal. Vestibular: No nystagmus at any point of gaze.   Assessment The patient's history and physical findings are consistent with frequent tonsilloliths with halitosis, secondary to chronic tonsillitis/pharyngitis.  Plan  1.  The physical exam findings are reviewed with the patient. 2.  Recommend the use of a water pik or gargling with salt water.  3.  The option of adenotonsillectomy to treat her recurrent symptoms is also discussed. The risks, benefits, alternatives, and details of the procedure are reviewed. Questions are invited and answered. The patient would like to proceed with adenotonsillectomy procedure.

## 2016-05-25 NOTE — Op Note (Signed)
DATE OF PROCEDURE:  05/25/2016                              OPERATIVE REPORT  SURGEON:  Newman PiesSu Niaomi Cartaya, MD  PREOPERATIVE DIAGNOSES: 1. Adenotonsillar hypertrophy. 2. Chronic tonsillitis and pharyngitis  POSTOPERATIVE DIAGNOSES: 1. Adenotonsillar hypertrophy. 2. Chronic tonsillitis and pharyngitis  PROCEDURE PERFORMED:  Adenotonsillectomy.  ANESTHESIA:  General endotracheal tube anesthesia.  COMPLICATIONS:  None.  ESTIMATED BLOOD LOSS:  Minimal.  INDICATION FOR PROCEDURE:  Alexandra Meadows is a 20 y.o. female with a history of chronic tonsillitis/pharyngitis and halitosis since her pregnancy.  According to the patient, she has been experiencing chronic throat discomfort with halitosis for several months. The patient continues to be symptomatic despite medical treatments. On examination, the patient was noted to have bilateral cryptic tonsils, with numerous tonsilloliths. Based on the above findings, the decision was made for the patient to undergo the adenotonsillectomy procedure. Likelihood of success in reducing symptoms was also discussed.  The risks, benefits, alternatives, and details of the procedure were discussed with the patient.  Questions were invited and answered.  Informed consent was obtained.  DESCRIPTION:  The patient was taken to the operating room and placed supine on the operating table.  General endotracheal tube anesthesia was administered by the anesthesiologist.  The patient was positioned and prepped and draped in a standard fashion for adenotonsillectomy.  A Crowe-Davis mouth gag was inserted into the oral cavity for exposure. 2+ cryptic tonsils were noted bilaterally.  No bifidity was noted.  Indirect mirror examination of the nasopharynx revealed mild adenoid hypertrophy. The adenoid was resected with the Coblator device. Hemostasis was achieved with the Coblator device.  The right tonsil was then grasped with a straight Allis clamp and retracted medially.  It was  resected free from the underlying pharyngeal constrictor muscles with the Coblator device.  The same procedure was repeated on the left side without exception.  The surgical sites were copiously irrigated.  The mouth gag was removed.  The care of the patient was turned over to the anesthesiologist.  The patient was awakened from anesthesia without difficulty.  The patient was extubated and transferred to the recovery room in good condition.  OPERATIVE FINDINGS:  Adenotonsillar hypertrophy.  SPECIMEN:  Bilateral tonsils and adenoid  FOLLOWUP CARE:  The patient will be discharged home once awake and alert.  She will be placed on amoxicillin 800 mg p.o. b.i.d. for 5 days, and oxycodone 5-4010ml po q 4 hours for postop pain control.   The patient will follow up in my office in approximately 2 weeks.  Severin Bou,SUI W 05/25/2016 10:03 AM

## 2016-05-26 ENCOUNTER — Encounter (HOSPITAL_BASED_OUTPATIENT_CLINIC_OR_DEPARTMENT_OTHER): Payer: Self-pay | Admitting: Otolaryngology

## 2016-06-02 ENCOUNTER — Ambulatory Visit (INDEPENDENT_AMBULATORY_CARE_PROVIDER_SITE_OTHER): Payer: 59 | Admitting: Psychology

## 2016-06-02 DIAGNOSIS — F331 Major depressive disorder, recurrent, moderate: Secondary | ICD-10-CM | POA: Diagnosis not present

## 2016-06-09 ENCOUNTER — Ambulatory Visit (INDEPENDENT_AMBULATORY_CARE_PROVIDER_SITE_OTHER): Payer: 59 | Admitting: Psychology

## 2016-06-09 DIAGNOSIS — F331 Major depressive disorder, recurrent, moderate: Secondary | ICD-10-CM

## 2016-06-25 ENCOUNTER — Ambulatory Visit (INDEPENDENT_AMBULATORY_CARE_PROVIDER_SITE_OTHER): Payer: 59 | Admitting: Psychology

## 2016-06-25 DIAGNOSIS — F331 Major depressive disorder, recurrent, moderate: Secondary | ICD-10-CM

## 2016-07-01 ENCOUNTER — Telehealth: Payer: Self-pay | Admitting: Family Medicine

## 2016-07-01 DIAGNOSIS — R202 Paresthesia of skin: Principal | ICD-10-CM

## 2016-07-01 DIAGNOSIS — R200 Anesthesia of skin: Secondary | ICD-10-CM

## 2016-07-01 NOTE — Telephone Encounter (Signed)
Pt would like a referral to a neurologist. Pt states she has mentioned to the dr why she would see one.

## 2016-07-05 NOTE — Telephone Encounter (Signed)
I spoke with pt and went over below message.  

## 2016-07-05 NOTE — Telephone Encounter (Signed)
The referral was done  

## 2016-07-07 ENCOUNTER — Ambulatory Visit (INDEPENDENT_AMBULATORY_CARE_PROVIDER_SITE_OTHER): Payer: 59 | Admitting: Neurology

## 2016-07-07 ENCOUNTER — Encounter: Payer: Self-pay | Admitting: Neurology

## 2016-07-07 VITALS — BP 104/70 | HR 96 | Ht 64.0 in | Wt 140.5 lb

## 2016-07-07 DIAGNOSIS — G5603 Carpal tunnel syndrome, bilateral upper limbs: Secondary | ICD-10-CM | POA: Diagnosis not present

## 2016-07-07 DIAGNOSIS — M5416 Radiculopathy, lumbar region: Secondary | ICD-10-CM

## 2016-07-07 NOTE — Patient Instructions (Signed)
1.  We will check a nerve study to see if there is any problems with the nerves in your low back.  We will also check for carpal tunnel as well. 2.  Further recommendations pending results.

## 2016-07-07 NOTE — Progress Notes (Signed)
NEUROLOGY FOLLOW UP OFFICE NOTE  Alexandra Meadows [id number]  HISTORY OF PRESENT ILLNESS: Alexandra MendsChristina Meadows is a 20 year old right-handed female with anxiety, depression, insomnia, left knee surgery and migraine whom I previously saw for recurrent loss of consciousness in 2015 (she did not follow up), returns for evaluation of paresthesias.  History supplemented by OB notes from hospital.  She gave birth to a healthy baby girl on 04/04/16 via spontaneous vaginal delivery.  She received an epidural for the delivery.  Following her delivery, she developed increased sharp lower mid-back pain with raised area at the epidural site.  Since then, she continues to have low back pain which sometimes radiates down the back of both legs (left worse than right), when she is sitting.  She also reports intermittent pins and needles sensation in the calves.  She denies weakness.  She denies perineal numbness.  She reports that a bowel movement causes lower abdominal pain, but she denies bowel or bladder dysfunction.  Labs from October include B12 of 320 and TSH of 0.86.  BMP revealed K of 3.2, for which she received supplementation.  She feels the back pain has gotten worse.   Around the same time, she reports intermittent numbness and tingling in both hands, usually when she is on her computer.  She denies neck pain.  Of note, she has not had any recurrent episodes of loss of consciousness since her last visit.  She is no longer on the Lamictal.  PAST MEDICAL HISTORY: Past Medical History:  Diagnosis Date  . Asthma    prn inhaler  . 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 Migraines   . Seasonal allergies   . Tonsillar hypertrophy 04/2016    MEDICATIONS: Current Outpatient Prescriptions on File Prior to Visit  Medication Sig Dispense Refill  . acetaminophen (TYLENOL) 500 MG tablet Take 500-1,000 mg by mouth every 6 (six) hours as needed for mild pain, moderate  pain or headache.     . albuterol (PROVENTIL HFA;VENTOLIN HFA) 108 (90 BASE) MCG/ACT inhaler Inhale 2 puffs into the lungs every 4 (four) hours as needed for wheezing or shortness of breath. 1 Inhaler 11  . diphenhydrAMINE (BENADRYL) 25 MG tablet Take 25 mg by mouth every 6 (six) hours as needed.    . Prenatal Vit-Fe Fumarate-FA (PRENATAL MULTIVITAMIN) TABS tablet Take 1 tablet by mouth daily at 12 noon. 30 tablet 6   No current facility-administered medications on file prior to visit.     ALLERGIES: Allergies  Allergen Reactions  . Bee Venom Hives and Swelling  . Tetanus Toxoids Hives  . Xanax [Alprazolam] Other (See Comments)    SEIZURES    FAMILY HISTORY: History reviewed. No pertinent family history.  SOCIAL HISTORY: Social History   Social History  . Marital status: Single    Spouse name: N/A  . Number of children: N/A  . Years of education: N/A   Occupational History  . Not on file.   Social History Main Topics  . Smoking status: Never Smoker  . Smokeless tobacco: Never Used     Comment: quit  . Alcohol use No  . Drug use: No  . Sexual activity: Yes   Other Topics Concern  . 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   Lives with parents in  a 2 story home.  Currently not working.  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  Education: college.    REVIEW OF SYSTEMS: Constitutional: No fevers, chills, or sweats, no generalized fatigue, change in appetite Eyes: No visual changes, double vision, eye pain Ear, nose and throat: No hearing loss, ear pain, nasal congestion, sore throat Cardiovascular: No chest pain, palpitations Respiratory:  No shortness of breath at rest or with exertion, wheezes GastrointestinaI: occasional lower abdominal pain Genitourinary:  No dysuria, urinary retention or  frequency Musculoskeletal:  Back pain Integumentary: No rash, pruritus, skin lesions Neurological: as above Psychiatric: No depression, insomnia, anxiety Endocrine: No palpitations, fatigue, diaphoresis, mood swings, change in appetite, change in weight, increased thirst Hematologic/Lymphatic:  No purpura, petechiae. Allergic/Immunologic: no itchy/runny eyes, nasal congestion, recent allergic reactions, rashes  PHYSICAL EXAM: Vitals:   07/07/16 0911  BP: 104/70  Pulse: 96   General: No acute distress.  Patient appears well-groomed.  normal body habitus. Head:  Normocephalic/atraumatic Eyes:  Fundi examined but not visualized Neck: supple, no paraspinal tenderness, full range of motion Heart:  Regular rate and rhythm Lungs:  Clear to auscultation bilaterally Back: No paraspinal tenderness Neurological Exam: alert and oriented to person, place, and time. Attention span and concentration intact, recent and remote memory intact, fund of knowledge intact.  Speech fluent and not dysarthric, language intact.  CN II-XII intact. Bulk and tone normal, muscle strength 5/5 throughout.  Sensation to pinprick sensation slightly reduced in calf on left and vibration intact.  Deep tendon reflexes 2+ throughout, toes downgoing.  Finger to nose and heel to shin testing intact.  Gait normal, Romberg negative.  IMPRESSION: 1.  Possible bilateral lumbar radiculitis (left worse than right) 2.  Probable bilateral carpal tunnel syndrome  PLAN: 1.  Will check NCV-EMG to evaluate for lumbar radiculopathy and carpal tunnel 2.  Further recommendations pending result.  May need to order MRI of lumbar spine with and without contrast.  26 minutes spent face to face with patient, over 50% spent discussing history of present problem as well as possible etiologies and plan.   Shon MilletAdam Jaffe, DO  CC:  Gershon CraneStephen Fry, MD

## 2016-07-09 ENCOUNTER — Telehealth: Payer: Self-pay | Admitting: Family Medicine

## 2016-07-09 NOTE — Telephone Encounter (Signed)
Call in #[phone] with 5 rf 

## 2016-07-09 NOTE — Telephone Encounter (Signed)
Pt need new Rx for Flexril    Ph[facility]:  [facility] [address]

## 2016-07-12 ENCOUNTER — Ambulatory Visit (INDEPENDENT_AMBULATORY_CARE_PROVIDER_SITE_OTHER): Payer: 59 | Admitting: Psychology

## 2016-07-12 DIAGNOSIS — F331 Major depressive disorder, recurrent, moderate: Secondary | ICD-10-CM

## 2016-07-12 MED ORDER — CYCLOBENZAPRINE HCL 10 MG PO TABS: 10.0000 mg | ORAL_TABLET | Freq: Three times a day (TID) | ORAL | 5 refills | Status: DC | PRN

## 2016-07-12 NOTE — Telephone Encounter (Signed)
[facility] 

## 2016-07-22 ENCOUNTER — Ambulatory Visit (INDEPENDENT_AMBULATORY_CARE_PROVIDER_SITE_OTHER): Payer: 59 | Admitting: Neurology

## 2016-07-22 DIAGNOSIS — M5416 Radiculopathy, lumbar region: Secondary | ICD-10-CM | POA: Diagnosis not present

## 2016-07-22 DIAGNOSIS — G5603 Carpal tunnel syndrome, bilateral upper limbs: Secondary | ICD-10-CM

## 2016-07-22 NOTE — Addendum Note (Signed)
Addended bySilvio Pate: MCCRACKEN, JADE L on: 07/22/2016 01:13 PM   Modules accepted: Orders

## 2016-07-22 NOTE — Procedures (Signed)
[facility]  [address], [address]  [place], [place] [address] Tel: [phone] Fax:  [phone] Test Date:  07/22/2016  Patient: Alexandra Meadows DOB: 12/25/1995 Physician: Nita Sickleonika Majesta Leichter, DO  Sex: Female Height: 5\' 3"  Ref Phys: Shon MilletAdam Jaffe, DO  ID#: [id number] Temp: 32.2 Technician: M. Dean   Patient Complaints: This is a 20 year old female referred for evaluation of bilateral lower extremity numbness, weakness and tingling.  NCV & EMG Findings: Extensive electrodiagnostic testing of the right lower extremity and additional studies of the left shows:  1. Bilateral sural and superficial peroneal sensory responses are within normal limits. 2. Bilateral peroneal and tibial motor responses are within normal limits. 3. Bilateral tibial H reflex studies are within normal limits. 4. There is no evidence of active or chronic motor axon loss changes affecting any of the tested muscles. Motor unit configuration and recruitment pattern is within normal limits.  Impression: This is a normal study of the lower extremities.   In particular, there is no evidence of a lumbosacral radiculopathy, diffuse myopathy, or sensorimotor polyneuropathy.   ___________________________ Nita Sickleonika Roemello Speyer, DO    Nerve Conduction Studies Anti Sensory Summary Table   Site NR Peak (ms) Norm Peak (ms) P-T Amp (V) Norm P-T Amp  Left Sup Peroneal Anti Sensory (Ant Lat Mall)  32.2C  12 cm    3.5 <4.4 8.2 >6  Right Sup Peroneal Anti Sensory (Ant Lat Mall)  32.2C  12 cm    2.8 <4.4 12.2 >6  Left Sural Anti Sensory (Lat Mall)  32.2C  Calf    3.9 <4.4 9.8 >6  Right Sural Anti Sensory (Lat Mall)  32.2C  Calf    3.8 <4.4 12.9 >6   Motor Summary Table   Site NR Onset (ms) Norm Onset (ms) O-P Amp (mV) Norm O-P Amp Site1 Site2 Delta-0 (ms) Dist (cm) Vel (m/s) Norm Vel (m/s)  Left Peroneal Motor (Ext Dig Brev)  32.2C  Ankle    3.5 <5.5 3.1 >3 B Fib Ankle 6.7 31.0 46 >41  B Fib    10.2  2.7   Poplt B Fib 1.6 10.0 62 >41  Poplt    11.8  2.3         Right Peroneal Motor (Ext Dig Brev)  32.2C  Ankle    3.0 <5.5 3.5 >3 B Fib Ankle 7.2 31.0 43 >41  B Fib    10.2  2.5  Poplt B Fib 2.1 10.0 48 >41  Poplt    12.3  2.5         Left Tibial Motor (Abd Hall Brev)  32.2C  Ankle    3.0 <5.8 16.5 >8 Knee Ankle 8.4 38.0 45 >41  Knee    11.4  13.7         Right Tibial Motor (Abd Hall Brev)  32.2C  Ankle    2.7 <5.8 13.6 >8 Knee Ankle 8.2 38.0 46 >41  Knee    10.9  9.7          H Reflex Studies   NR H-Lat (ms) Lat Norm (ms) L-R H-Lat (ms) M-Lat (ms) HLat-MLat (ms)  Left Tibial (Gastroc)  32.2C     27.76 <35 4.08 3.54 24.22  Right Tibial (Gastroc)  32.2C     31.84 <35 4.08 3.54 28.30   EMG   Side Muscle Ins Act Fibs Psw Fasc Number Recrt Dur Dur. Amp Amp. Poly Poly. Comment  Right Lumbo Parasp Low Nml Nml Nml Nml Nml Nml Nml  Nml Nml Nml Nml Nml N/A  Right AntTibialis Nml Nml Nml Nml Nml Nml Nml Nml Nml Nml Nml Nml N/A  Right Gastroc Nml Nml Nml Nml Nml Nml Nml Nml Nml Nml Nml Nml N/A  Right Flex Dig Long Nml Nml Nml Nml Nml Nml Nml Nml Nml Nml Nml Nml N/A  Right RectFemoris Nml Nml Nml Nml Nml Nml Nml Nml Nml Nml Nml Nml N/A  Right GluteusMed Nml Nml Nml Nml Nml Nml Nml Nml Nml Nml Nml Nml N/A  Right BicepsFemS Nml Nml Nml Nml Nml Nml Nml Nml Nml Nml Nml Nml N/A  Left AntTibialis Nml Nml Nml Nml Nml Nml Nml Nml Nml Nml Nml Nml N/A  Left BicepsFemS Nml Nml Nml Nml Nml Nml Nml Nml Nml Nml Nml Nml N/A  Left Gastroc Nml Nml Nml Nml Nml Nml Nml Nml Nml Nml Nml Nml N/A  Left Flex Dig Long Nml Nml Nml Nml Nml Nml Nml Nml Nml Nml Nml Nml N/A  Left RectFemoris Nml Nml Nml Nml Nml Nml Nml Nml Nml Nml Nml Nml N/A  Left GluteusMed Nml Nml Nml Nml Nml Nml Nml Nml Nml Nml Nml Nml N/A      Waveforms:

## 2016-07-23 ENCOUNTER — Telehealth: Payer: Self-pay | Admitting: Neurology

## 2016-07-23 NOTE — Telephone Encounter (Signed)
-----   Message from Drema DallasAdam R Jaffe, DO sent at 07/23/2016 12:47 PM EST ----- Nerve study for the legs and back was normal.  I would like to get an MRI of the lumbar spine without contrast to evaluate for anything that would be causing the leg pain

## 2016-07-23 NOTE — Telephone Encounter (Signed)
Left message on machine for patient to call back.

## 2016-07-27 NOTE — Telephone Encounter (Signed)
Mychart message sent to patient.

## 2016-07-29 ENCOUNTER — Ambulatory Visit (INDEPENDENT_AMBULATORY_CARE_PROVIDER_SITE_OTHER): Payer: 59 | Admitting: Neurology

## 2016-07-29 DIAGNOSIS — R202 Paresthesia of skin: Secondary | ICD-10-CM

## 2016-07-29 DIAGNOSIS — G5603 Carpal tunnel syndrome, bilateral upper limbs: Secondary | ICD-10-CM

## 2016-07-29 NOTE — Procedures (Signed)
[facility]  [address] [place], [address]  [place], [place] [address] Tel: [phone] Fax:  [phone] Test Date:  07/29/2016  Patient: Alexandra Meadows DOB: [birth year] Physician: Nita Sickle, DO  Sex: Female Height: 5\' 5"  Ref Phys: Nita Sickle, DO  ID#: [id number] Temp: 32.8C Technician: Judie Petit. Dean   Patient Complaints: This is a 21 year old female referred for evaluation of bilateral hand paresthesias.   NCV & EMG Findings: Extensive electrodiagnostic testing of the right upper extremity and additional studies of the left shows:  1. Bilateral median, ulnar, and mixed palmar sensory responses are within normal limits. 2. Bilateral median and ulnar motor responses are within normal limits. 3. There is no evidence of active or chronic motor axon loss changes affecting any of the tested muscles. Motor unit configuration and recruitment pattern is within normal limits.  Impression: This is a normal study of the upper extremities.   In particular, there is no evidence of carpal tunnel syndrome or a cervical radiculopathy.   ___________________________ Nita Sickle, DO    Nerve Conduction Studies Anti Sensory Summary Table   Site NR Peak (ms) Norm Peak (ms) P-T Amp (V) Norm P-T Amp  Left Median Anti Sensory (2nd Digit)  32.8C  Wrist    3.3 <3.3 41.7 >20  Right Median Anti Sensory (2nd Digit)  32.8C  Wrist    3.3 <3.3 43.9 >20  Left Ulnar Anti Sensory (5th Digit)  32.8C  Wrist    2.9 <3.0 32.6 >18  Right Ulnar Anti Sensory (5th Digit)  32.8C  Wrist    3.0 <3.0 35.3 >18   Motor Summary Table   Site NR Onset (ms) Norm Onset (ms) O-P Amp (mV) Norm O-P Amp Site1 Site2 Delta-0 (ms) Dist (cm) Vel (m/s) Norm Vel (m/s)  Left Median Motor (Abd Poll Brev)  32.8C  Wrist    3.0 <3.9 6.5 >6 Elbow Wrist 3.9 23.0 59 >51  Elbow    6.9  5.9         Right Median Motor (Abd Poll Brev)  32.8C  Wrist    3.4 <3.9 9.0 >6 Elbow Wrist 3.9 21.0 54 >51  Elbow    7.3  8.0           Left Ulnar Motor (Abd Dig Minimi)  32.8C  Wrist    2.6 <3.0 9.7 >8 B Elbow Wrist 3.3 19.0 58 >51  B Elbow    5.9  9.0  A Elbow B Elbow 1.5 10.0 67 >51  A Elbow    7.4  8.7         Right Ulnar Motor (Abd Dig Minimi)  32.8C  Wrist    2.9 <3.0 9.8 >8 B Elbow Wrist 3.5 20.0 57 >51  B Elbow    6.4  9.6  A Elbow B Elbow 1.6 10.0 63 >51  A Elbow    8.0  9.0          Comparison Summary Table   Site NR Peak (ms) Norm Peak (ms) P-T Amp (V) Site1 Site2 Delta-P (ms) Norm Delta (ms)  Left Median/Ulnar Palm Comparison (Wrist - 8cm)  32.8C  Median Palm    2.1 <2.2 90.7 Median Palm Ulnar Palm 0.0   Ulnar Palm    2.1 <2.2 24.2      Right Median/Ulnar Palm Comparison (Wrist - 8cm)  32.8C  Median Palm    1.8 <2.2 122.3 Median Palm Ulnar Palm 0.1   Ulnar Palm    1.7 <2.2 22.7  EMG   Side Muscle Ins Act Fibs Psw Fasc Number Recrt Dur Dur. Amp Amp. Poly Poly. Comment  Right 1stDorInt Nml Nml Nml Nml Nml Nml Nml Nml Nml Nml Nml Nml N/A  Right Ext Indicis Nml Nml Nml Nml Nml Nml Nml Nml Nml Nml Nml Nml N/A  Right PronatorTeres Nml Nml Nml Nml Nml Nml Nml Nml Nml Nml Nml Nml N/A  Right Biceps Nml Nml Nml Nml Nml Nml Nml Nml Nml Nml Nml Nml N/A  Right Triceps Nml Nml Nml Nml Nml Nml Nml Nml Nml Nml Nml Nml N/A  Right Deltoid Nml Nml Nml Nml Nml Nml Nml Nml Nml Nml Nml Nml N/A  Left 1stDorInt Nml Nml Nml Nml Nml Nml Nml Nml Nml Nml Nml Nml N/A  Left Ext Indicis Nml Nml Nml Nml Nml Nml Nml Nml Nml Nml Nml Nml N/A  Left Deltoid Nml Nml Nml Nml Nml Nml Nml Nml Nml Nml Nml Nml N/A  Left Triceps Nml Nml Nml Nml Nml Nml Nml Nml Nml Nml Nml Nml N/A  Left Biceps Nml Nml Nml Nml Nml Nml Nml Nml Nml Nml Nml Nml N/A  Left PronatorTeres Nml Nml Nml Nml Nml Nml Nml Nml Nml Nml Nml Nml N/A      Waveforms:

## 2016-07-30 ENCOUNTER — Telehealth: Payer: Self-pay

## 2016-07-30 ENCOUNTER — Ambulatory Visit: Payer: 59 | Admitting: Psychology

## 2016-07-30 DIAGNOSIS — M5416 Radiculopathy, lumbar region: Secondary | ICD-10-CM

## 2016-07-30 NOTE — Telephone Encounter (Signed)
-----   Message from Drema DallasAdam R Jaffe, DO sent at 07/30/2016 12:27 PM EST ----- Nerve study is negative for carpal tunnel, but I still feel that her symptoms in the hands are mild carpal tunnel.  I recommend trying to wear wrist splints (at least at bedtime).  I would like to order an MRI of the lumbar spine with and without contrast to see if there is anything left from the epidural injection that may be causing leg pain and numbness.

## 2016-07-30 NOTE — Telephone Encounter (Signed)
Called patient. Gave nerve study results and went over recommendations. Ordered MRI L-spine w & w/o contrast.  Patient verbalized understanding.

## 2016-08-03 ENCOUNTER — Encounter: Payer: Self-pay | Admitting: Physician Assistant

## 2016-08-03 ENCOUNTER — Ambulatory Visit (INDEPENDENT_AMBULATORY_CARE_PROVIDER_SITE_OTHER): Payer: BLUE CROSS/BLUE SHIELD | Admitting: Physician Assistant

## 2016-08-03 VITALS — BP 123/84 | HR 66 | Temp 98.3°F | Resp 16 | Ht 63.5 in | Wt 140.4 lb

## 2016-08-03 DIAGNOSIS — K5900 Constipation, unspecified: Secondary | ICD-10-CM | POA: Diagnosis not present

## 2016-08-03 DIAGNOSIS — K649 Unspecified hemorrhoids: Secondary | ICD-10-CM | POA: Diagnosis not present

## 2016-08-03 DIAGNOSIS — R1084 Generalized abdominal pain: Secondary | ICD-10-CM

## 2016-08-03 MED ORDER — HYDROCORTISONE ACETATE 25 MG RE SUPP: 25.0000 mg | Freq: Two times a day (BID) | RECTAL | 0 refills | Status: DC

## 2016-08-03 MED ORDER — DOCUSATE SODIUM 100 MG PO CAPS: 100.0000 mg | ORAL_CAPSULE | Freq: Three times a day (TID) | ORAL | 0 refills | Status: DC

## 2016-08-03 NOTE — Progress Notes (Signed)
Alexandra Meadows  MRN: [id number] DOB: [birth year]  PCP: Gershon Crane, MD  Subjective:  Pt is a 21 year old female who presents to clinic for blood in stool.  History of hemorrhoids.  Sept 10 gave birth. Had hemorrhoids for about three weeks. Then painful stools with mild bleeding. 3 days ago noticed a lot more blood in the toilet bowl.  Today she noticed a "blood clot" in her stool, about the size of her pinky finger.  Her stools are not hard but are not soft. Has bowel movement every day. Stools are difficult to move. Spends about 10-15 minutes on the toilet. Strains a lot to move bowels.  Denies itching, burning of rectum.   +abdominal pain. Generalized abdominal pain, both sides.   She is a vegetarian: Dairy, cheese, eggs, veggies, lots of fiber, beans. Eats a lot of sugar. She drinks about 5-10 glasses of water a day. Does not exercise.   Review of Systems  Constitutional: Negative for chills, diaphoresis, fatigue and fever.  Respiratory: Negative for cough, chest tightness, shortness of breath and wheezing.   Cardiovascular: Negative for chest pain and palpitations.  Gastrointestinal: Positive for abdominal pain and constipation. Negative for diarrhea, nausea and vomiting.  Neurological: Negative for weakness, light-headedness and headaches.    Patient Active Problem List   Diagnosis Date Noted  . Active labor at term 04/04/2016  . 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 Acute pyelonephritis 03/13/2014  . Anxiety state 02/17/2014  . Depression 11/29/2013  . Knee pain 09/27/2013  . Mononucleosis syndrome 07/30/2011  . NECK SPRAIN AND STRAIN 04/28/2010  . 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 WRIST PAIN 07/21/2009  . Allergic rhinitis 06/02/2009  . ACUTE SINUSITIS, UNSPECIFIED 05/23/2009  . MIGRAINE HEADACHE 04/15/2009  . PARONYCHIA, TOE 04/15/2009  . 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 IRREGULAR MENSES 12/09/2008  . BACK PAIN, THORACIC REGION 05/14/2008  . ACUTE BRONCHITIS 03/20/2008  .  INSOMNIA 03/20/2008  . TOE SPRAIN 12/25/2007  . ADHD 08/29/2007  . METRORRHAGIA 08/29/2007  . OM, ACUTE SUPPURATIVE NOS 05/19/2007  . MIGRAINE, COMMON W/O INTRACTABLE MIGRAINE 03/29/2007    Current Outpatient Prescriptions on File Prior to Visit  Medication Sig Dispense Refill  . acetaminophen (TYLENOL) 500 MG tablet Take 500-1,000 mg by mouth every 6 (six) hours as needed for mild pain, moderate pain or headache.     . albuterol (PROVENTIL HFA;VENTOLIN HFA) 108 (90 BASE) MCG/ACT inhaler Inhale 2 puffs into the lungs every 4 (four) hours as needed for wheezing or shortness of breath. 1 Inhaler 11  . cyclobenzaprine (FLEXERIL) 10 MG tablet Take 1 tablet (10 mg total) by mouth 3 (three) times daily as needed for muscle spasms. 90 tablet 5  . diphenhydrAMINE (BENADRYL) 25 MG tablet Take 25 mg by mouth every 6 (six) hours as needed.    Marland Kitchen KLOR-CON 10 10 MEQ tablet     . Prenatal Vit-Fe Fumarate-FA (PRENATAL MULTIVITAMIN) TABS tablet Take 1 tablet by mouth daily at 12 noon. 30 tablet 6   No current facility-administered medications on file prior to visit.     Allergies  Allergen Reactions  . Bee Venom Hives and Swelling  . Tetanus Toxoids Hives  . Xanax [Alprazolam] Other (See Comments)    SEIZURES     Objective:  BP 123/84 (BP Location: Right Arm, Patient Position: Sitting, Cuff Size: Normal)   Pulse 66   Temp 98.3 F (36.8 C) (Oral)   Resp 16   Ht 5' 3.5" (1.613 m)   Wt 140 lb  6.4 oz (63.7 kg)   SpO2 99%   Breastfeeding? Yes   BMI 24.48 kg/m   Physical Exam  Constitutional: She is oriented to person, place, and time and well-developed, well-nourished, and in no distress. No distress.  Cardiovascular: Normal rate, regular rhythm and normal heart sounds.   Abdominal: Soft. There is tenderness (mild, generalized ).  Genitourinary: Rectal exam shows external hemorrhoid (not thrombosed, no bleeding). Rectal exam shows no fissure.  Neurological: She is alert and oriented to  person, place, and time. GCS score is 15.  Skin: Skin is warm and dry.  Psychiatric: Mood, memory, affect and judgment normal.  Vitals reviewed.   Assessment and Plan :  1. Hemorrhoids, unspecified hemorrhoid type 2. Constipation, unspecified constipation type 3. Generalized abdominal pain - hydrocortisone (ANUSOL-HC) 25 MG suppository; Place 1 suppository (25 mg total) rectally 2 (two) times daily.  Dispense: 12 suppository; Refill: 0 - docusate sodium (COLACE) 100 MG capsule; Take 1 capsule (100 mg total) by mouth 3 (three) times daily.  Dispense: 30 capsule; Refill: 0 - Supportive care: Increase fluid intake, exercise, increase dietary fiber, use a stool to rest feet on while having bowel movements, reduce amt of time spent on toilet. RTC if symptoms persist.   Marco CollieWhitney Makiah Foye, PA-C  [facility] 08/03/2016 2:18 PM

## 2016-08-03 NOTE — Patient Instructions (Addendum)
Colase:  Take 3 a day. Then, taper to 1-3... However many you need to take in order to have soft stools. Please drink at least 2 liters of water a day. Increase your exercise.  Come back if this is not helping.  Hydrocortisone: apply twice a day for a max of 5-7 days  Thank you for coming in today. I hope you feel we met your needs.  Feel free to call [facility] if you have any questions or further requests.  Please consider signing up for MyChart if you do not already have it, as this is a great way to communicate with me.  Best,  Whitney McVey, PA-C   IF you received an x-ray today, you will receive an invoice from [facility]. Please contact [facility] at [phone] with questions or concerns regarding your invoice.   IF you received labwork today, you will receive an invoice from [facility]. Please contact [facility] at [phone] with questions or concerns regarding your invoice.   Our billing staff will not be able to assist you with questions regarding bills from these companies.  You will be contacted with the lab results as soon as they are available. The fastest way to get your results is to activate your My Chart account. Instructions are located on the last page of this paperwork. If you have not heard from [place] regarding the results in 2 weeks, please contact this office.

## 2016-12-17 ENCOUNTER — Encounter: Payer: Self-pay | Admitting: Physician Assistant

## 2016-12-17 ENCOUNTER — Ambulatory Visit (INDEPENDENT_AMBULATORY_CARE_PROVIDER_SITE_OTHER): Payer: BLUE CROSS/BLUE SHIELD | Admitting: Physician Assistant

## 2016-12-17 VITALS — BP 111/75 | HR 110 | Temp 98.4°F | Resp 16 | Ht 63.5 in | Wt 143.2 lb

## 2016-12-17 DIAGNOSIS — M791 Myalgia, unspecified site: Secondary | ICD-10-CM

## 2016-12-17 DIAGNOSIS — R112 Nausea with vomiting, unspecified: Secondary | ICD-10-CM | POA: Diagnosis not present

## 2016-12-17 DIAGNOSIS — R197 Diarrhea, unspecified: Secondary | ICD-10-CM | POA: Diagnosis not present

## 2016-12-17 LAB — POC INFLUENZA A&B (BINAX/QUICKVUE)
Influenza A, POC: NEGATIVE
Influenza B, POC: NEGATIVE

## 2016-12-17 MED ORDER — ONDANSETRON 4 MG PO TBDP: 4.0000 mg | ORAL_TABLET | Freq: Three times a day (TID) | ORAL | 0 refills | Status: DC | PRN

## 2016-12-17 MED ORDER — ONDANSETRON 4 MG PO TBDP
4.0000 mg | ORAL_TABLET | Freq: Once | ORAL | Status: AC
  Administered 2016-12-17: 4 mg via ORAL

## 2016-12-17 NOTE — Progress Notes (Signed)
Alexandra Meadows  MRN: [id number] DOB: 1995/11/24  PCP: Nelwyn Salisbury, MD  Chief Complaint  Patient presents with  . Influenza-like Symptoms    fever, nausea, body aches    Subjective:  Pt presents to clinic for cold symptoms that started yesterday.  Started with a headache with nausea and then hot sweats and chills.  Tmax 101 this am.  She has used Nyquil and Tylenol.  She has myalgias without nasal congestion or cough.  No sore throat.  She has had vomiting last pm for 1 episode and diarrhea 2x/today - like water.  No eating since yesterday due to nausea.  Pt wants a flu test - even after explanation of the flu and its symptoms she still wants to make sure.  Review of Systems  Constitutional: Positive for chills and fever.  HENT: Negative.   Gastrointestinal: Positive for diarrhea (2x/day - like water), nausea and vomiting (last night - once).  Genitourinary: Negative.     Patient Active Problem List   Diagnosis Date Noted  . 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 Anxiety state 02/17/2014  . Depression 11/29/2013  . Knee pain 09/27/2013  . 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 WRIST PAIN 07/21/2009  . Allergic rhinitis 06/02/2009  . PARONYCHIA, TOE 04/15/2009  . 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 IRREGULAR MENSES 12/09/2008  . BACK PAIN, THORACIC REGION 05/14/2008  . INSOMNIA 03/20/2008  . ADHD 08/29/2007  . METRORRHAGIA 08/29/2007  . MIGRAINE, COMMON W/O INTRACTABLE MIGRAINE 03/29/2007    Current Outpatient Prescriptions on File Prior to Visit  Medication Sig Dispense Refill  . acetaminophen (TYLENOL) 500 MG tablet Take 500-1,000 mg by mouth every 6 (six) hours as needed for mild pain, moderate pain or headache.     . albuterol (PROVENTIL HFA;VENTOLIN HFA) 108 (90 BASE) MCG/ACT inhaler Inhale 2 puffs into the lungs every 4 (four) hours as needed for wheezing or shortness of breath. 1 Inhaler 11  . cyclobenzaprine (FLEXERIL) 10 MG tablet Take 1 tablet (10 mg total) by mouth 3 (three)  times daily as needed for muscle spasms. 90 tablet 5  . diphenhydrAMINE (BENADRYL) 25 MG tablet Take 25 mg by mouth every 6 (six) hours as needed.    . docusate sodium (COLACE) 100 MG capsule Take 1 capsule (100 mg total) by mouth 3 (three) times daily. 30 capsule 0   No current facility-administered medications on file prior to visit.     Allergies  Allergen Reactions  . Bee Venom Hives and Swelling  . Tetanus Toxoids Hives  . Xanax [Alprazolam] Other (See Comments)    SEIZURES    Pt patients past, family and social history were reviewed and updated.   Objective:  BP 111/75 (BP Location: Right Arm, Patient Position: Sitting, Cuff Size: Normal)   Pulse (!) 110   Temp 98.4 F (36.9 C) (Oral)   Resp 16   Ht 5' 3.5" (1.613 m)   Wt 143 lb 3.2 oz (65 kg)   SpO2 97%   Breastfeeding? Yes   BMI 24.97 kg/m   Physical Exam  Constitutional: She is oriented to person, place, and time and well-developed, well-nourished, and in no distress.  HENT:  Head: Normocephalic and atraumatic.  Right Ear: Hearing, tympanic membrane, external ear and ear canal normal.  Left Ear: Hearing, tympanic membrane, external ear and ear canal normal.  Nose: Nose normal.  Mouth/Throat: Uvula is midline, oropharynx is clear and moist and mucous membranes are normal.  Eyes: Conjunctivae are normal.  Neck: Normal range of  motion.  Cardiovascular: Normal rate, regular rhythm and normal heart sounds.   No murmur heard. Pulmonary/Chest: Effort normal and breath sounds normal.  Abdominal: Soft. Bowel sounds are normal. She exhibits no mass. There is tenderness (generalized). There is no rebound and no guarding.  Neurological: She is alert and oriented to person, place, and time. Gait normal.  Skin: Skin is warm and dry.  Psychiatric: Mood, memory, affect and judgment normal.  Vitals reviewed.  Results for orders placed or performed in visit on 12/17/16  POC Influenza A&B(BINAX/QUICKVUE)  Result Value Ref  Range   Influenza A, POC Negative Negative   Influenza B, POC Negative Negative    Assessment and Plan :  Myalgia - Plan: POC Influenza A&B(BINAX/QUICKVUE)  Nausea and vomiting, intractability of vomiting not specified, unspecified vomiting type - Plan: ondansetron (ZOFRAN ODT) 4 MG disintegrating tablet, ondansetron (ZOFRAN-ODT) disintegrating tablet 4 mg, Care order/instruction:  Diarrhea, unspecified type  Patient is a currently breast-feeding mother  Symptomatic care d/w pt.  She was instructed to continue drinking a good amount of water to not reduce her milk supply.  Start eating when able to once again keep up her milk supply.  Hand hygiene emphasized to reduce transmission to child.  Benny LennertSarah Zabdi Mis PA-C  [facility] 12/17/2016 2:24 PM

## 2016-12-17 NOTE — Patient Instructions (Addendum)
Start with ice chips and then sips of clear fluids.  Once you are able to tolerated drinking liquids you can start with bland foods such as rice, apple sauce and toast.  If you can tolerate this you can advance diet as tolerated.  Limit dairy for several days to reduce return of diarrhea if you have been experiencing diarrhea.     IF you received an x-ray today, you will receive an invoice from [facility]. Please contact [facility] at [phone] with questions or concerns regarding your invoice.   IF you received labwork today, you will receive an invoice from [facility]. Please contact [facility] at [phone] with questions or concerns regarding your invoice.   Our billing staff will not be able to assist you with questions regarding bills from these companies.  You will be contacted with the lab results as soon as they are available. The fastest way to get your results is to activate your My Chart account. Instructions are located on the last page of this paperwork. If you have not heard from us regarding the results in 2 weeks, please contact this office.      

## 2017-04-14 ENCOUNTER — Encounter: Payer: Self-pay | Admitting: Family Medicine

## 2017-05-16 ENCOUNTER — Ambulatory Visit (INDEPENDENT_AMBULATORY_CARE_PROVIDER_SITE_OTHER): Payer: Self-pay

## 2017-05-16 ENCOUNTER — Encounter: Payer: Self-pay | Admitting: *Deleted

## 2017-05-16 VITALS — BP 129/81 | HR 83 | Ht 63.0 in | Wt 149.5 lb

## 2017-05-16 DIAGNOSIS — Z3200 Encounter for pregnancy test, result unknown: Secondary | ICD-10-CM

## 2017-05-16 DIAGNOSIS — Z3201 Encounter for pregnancy test, result positive: Secondary | ICD-10-CM

## 2017-05-16 LAB — POCT URINE PREGNANCY: PREG TEST UR: POSITIVE — AB

## 2017-05-16 NOTE — Progress Notes (Addendum)
Presents for UPT- POSITIVE. LMP 04/11/17;  EDD 01/16/17 [id number] today.  Patient to schedule NOB visit.  She taking PNV.   I have reviewed this chart and agree with the RMA assessment and management.    Baldemar LenisK. Meryl Davis, M.D. Attending Obstetrician & Gynecologist, [facility], [facility]

## 2017-05-27 ENCOUNTER — Ambulatory Visit: Payer: BLUE CROSS/BLUE SHIELD | Admitting: Family Medicine

## 2017-06-07 ENCOUNTER — Encounter (HOSPITAL_COMMUNITY): Payer: Self-pay | Admitting: *Deleted

## 2017-06-07 ENCOUNTER — Inpatient Hospital Stay (HOSPITAL_COMMUNITY)
Admission: AD | Admit: 2017-06-07 | Discharge: 2017-06-07 | Disposition: A | Discharge status: Home / Self Care | Payer: Medicaid Other | Source: Ambulatory Visit | Attending: Family Medicine | Admitting: Family Medicine

## 2017-06-07 ENCOUNTER — Other Ambulatory Visit: Payer: Self-pay

## 2017-06-07 DIAGNOSIS — Z3A08 8 weeks gestation of pregnancy: Secondary | ICD-10-CM | POA: Insufficient documentation

## 2017-06-07 DIAGNOSIS — R42 Dizziness and giddiness: Secondary | ICD-10-CM | POA: Insufficient documentation

## 2017-06-07 DIAGNOSIS — O210 Mild hyperemesis gravidarum: Secondary | ICD-10-CM | POA: Diagnosis present

## 2017-06-07 DIAGNOSIS — J45909 Unspecified asthma, uncomplicated: Secondary | ICD-10-CM | POA: Diagnosis not present

## 2017-06-07 DIAGNOSIS — O99511 Diseases of the respiratory system complicating pregnancy, first trimester: Secondary | ICD-10-CM | POA: Insufficient documentation

## 2017-06-07 DIAGNOSIS — O26811 Pregnancy related exhaustion and fatigue, first trimester: Secondary | ICD-10-CM | POA: Insufficient documentation

## 2017-06-07 LAB — URINALYSIS, ROUTINE W REFLEX MICROSCOPIC
BILIRUBIN URINE: NEGATIVE
GLUCOSE, UA: NEGATIVE mg/dL
Hgb urine dipstick: NEGATIVE
KETONES UR: NEGATIVE mg/dL
Leukocytes, UA: NEGATIVE
Nitrite: NEGATIVE
PH: 6 (ref 5.0–8.0)
Protein, ur: NEGATIVE mg/dL
Specific Gravity, Urine: 1.023 (ref 1.005–1.030)

## 2017-06-07 MED ORDER — PROMETHAZINE HCL 25 MG PO TABS: 25.0000 mg | ORAL_TABLET | Freq: Four times a day (QID) | ORAL | 0 refills | Status: DC | PRN

## 2017-06-07 NOTE — Discharge Instructions (Signed)
Get medication from your pharmacy. [facility]. Call if you do not get the Diclegis or other medication at the pharmacy.

## 2017-06-07 NOTE — MAU Note (Signed)
Been throwing up, not able to eat very much.  Feels weak.  Knows she is dehydrated.  abd cramping

## 2017-06-07 NOTE — MAU Provider Note (Signed)
History     CSN: [id number]  Arrival date and time: 06/07/17 1811   Seen by provider in Triage at 1945    Chief Complaint  Patient presents with  . Emesis  . Fatigue   HPI Alexandra Meadows 21 y.o. 6765w1d  [facility] but is having morning sickness now.  Did not want to try Unisom and B6 as she does not have the money to purchase and it did not work the last time.  Wanting Diclegis but it needs to be preapproved.  Has vomited bile but tonight is able to drink a smoothie type of drink.  No other problems.      OB History    Gravida Para Term Preterm AB Living   2 1 1     1    SAB TAB Ectopic Multiple Live Births         0 1      Past Medical History:  Diagnosis Date  . Asthma    prn inhaler  . 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 Migraines   . Seasonal allergies   . Tonsillar hypertrophy 04/2016    Past Surgical History:  Procedure Laterality Date  . WISDOM TOOTH EXTRACTION  2015    No family history on file.  Social History   Tobacco Use  . Smoking status: Never Smoker  . Smokeless tobacco: Never Used  . Tobacco comment: quit  Substance Use Topics  . Alcohol use: No    Alcohol/week: 0.0 oz  . Drug use: No    Allergies:  Allergies  Allergen Reactions  . Bee Venom Hives and Swelling  . Tetanus Toxoids Hives  . Xanax [Alprazolam] Other (See Comments)    SEIZURES    Medications Prior to Admission  Medication Sig Dispense Refill Last Dose  . acetaminophen (TYLENOL) 500 MG tablet Take 500-1,000 mg by mouth every 6 (six) hours as needed for mild pain, moderate pain or headache.    Not Taking  . albuterol (PROVENTIL HFA;VENTOLIN HFA) 108 (90 BASE) MCG/ACT inhaler Inhale 2 puffs into the lungs every 4 (four) hours as needed for wheezing or shortness of breath. (Patient not taking: Reported on 05/16/2017) 1 Inhaler 11 Not Taking  . cyclobenzaprine (FLEXERIL) 10 MG tablet Take 1 tablet (10 mg  total) by mouth 3 (three) times daily as needed for muscle spasms. (Patient not taking: Reported on 05/16/2017) 90 tablet 5 Not Taking  . diphenhydrAMINE (BENADRYL) 25 MG tablet Take 25 mg by mouth every 6 (six) hours as needed.   Not Taking  . docusate sodium (COLACE) 100 MG capsule Take 1 capsule (100 mg total) by mouth 3 (three) times daily. (Patient not taking: Reported on 05/16/2017) 30 capsule 0 Not Taking  . ondansetron (ZOFRAN ODT) 4 MG disintegrating tablet Take 1 tablet (4 mg total) by mouth every 8 (eight) hours as needed for nausea. (Patient not taking: Reported on 05/16/2017) 20 tablet 0 Not Taking  . sertraline (ZOLOFT) 100 MG tablet Take 100 mg by mouth daily.   Not Taking    Review of Systems  Constitutional: Positive for fatigue. Negative for fever.  Gastrointestinal: Positive for nausea and vomiting.  Genitourinary: Negative for dysuria, vaginal bleeding and vaginal discharge.  Neurological: Positive for dizziness.   Physical Exam   Blood pressure 114/60, pulse 83, temperature 98.3 F (36.8 C), temperature source Oral, resp. rate 16, weight 150 lb 8 oz (68.3  kg), last menstrual period 04/11/2017, SpO2 100 %, currently breastfeeding.  Physical Exam  Nursing note and vitals reviewed. Constitutional: She is oriented to person, place, and time. She appears well-developed and well-nourished.  HENT:  Head: Normocephalic.  Eyes: EOM are normal.  Neck: Neck supple.  Musculoskeletal: Normal range of motion.  Neurological: She is alert and oriented to person, place, and time.  Skin: Skin is warm and dry.  Psychiatric: She has a normal mood and affect.    MAU Course  Procedures Results for orders placed or performed during the hospital encounter of 06/07/17 (from the past 24 hour(s))  Urinalysis, Routine w reflex microscopic     Status: Abnormal   Collection Time: 06/07/17  6:46 PM  Result Value Ref Range   Color, Urine AMBER (A) YELLOW   APPearance CLOUDY (A) CLEAR    Specific Gravity, Urine 1.023 1.005 - 1.030   pH 6.0 5.0 - 8.0   Glucose, UA NEGATIVE NEGATIVE mg/dL   Hgb urine dipstick NEGATIVE NEGATIVE   Bilirubin Urine NEGATIVE NEGATIVE   Ketones, ur NEGATIVE NEGATIVE mg/dL   Protein, ur NEGATIVE NEGATIVE mg/dL   Nitrite NEGATIVE NEGATIVE   Leukocytes, UA NEGATIVE NEGATIVE    MDM With no ketones in urine, does not need IVF today.  Requesting Diclegis but it needs prior approval.  [facility] to get client in for a quick visit prior to her NOB to get Diclegis ordered.  In the meantime, will order Phenergan tablets for client to use.  Reviewed dietary instructions for Morning sickness.  Assessment and Plan  Morning Sickness in early pregnancy Fatigue which is normal in early pregnancy. Dizziness  Plan Prescribed phenergan 25 mg to her pharmacy - advised to take 1/2 tablets for milder symptoms. Return if has vaginal bleeding, diarrhea, or worsening symptoms.  Terri L Burleson 06/07/2017, 7:39 PM

## 2017-06-08 ENCOUNTER — Telehealth: Payer: Self-pay | Admitting: *Deleted

## 2017-06-08 NOTE — Telephone Encounter (Signed)
Pt was seen at the MAU for nausea/vomitting and need a prescription for Diclegis sent to the pharmacy for her. Please advise.Marland Kitchen.Marland Kitchen..Marland Kitchen

## 2017-06-09 ENCOUNTER — Other Ambulatory Visit: Payer: Self-pay | Admitting: Certified Nurse Midwife

## 2017-06-09 DIAGNOSIS — O219 Vomiting of pregnancy, unspecified: Secondary | ICD-10-CM

## 2017-06-09 MED ORDER — DOXYLAMINE-PYRIDOXINE 10-10 MG PO TBEC: DELAYED_RELEASE_TABLET | ORAL | 4 refills | Status: DC

## 2017-06-09 NOTE — Telephone Encounter (Signed)
Rx was sent this morning, it may need a prior auth so it make take a while.  Thank you.  Alexandra Meadows

## 2017-06-11 ENCOUNTER — Other Ambulatory Visit: Payer: Self-pay | Admitting: Nurse Practitioner

## 2017-06-29 ENCOUNTER — Encounter: Payer: Self-pay | Admitting: Certified Nurse Midwife

## 2017-06-30 ENCOUNTER — Ambulatory Visit (INDEPENDENT_AMBULATORY_CARE_PROVIDER_SITE_OTHER): Payer: Medicaid Other | Admitting: Certified Nurse Midwife

## 2017-06-30 ENCOUNTER — Other Ambulatory Visit (HOSPITAL_COMMUNITY)
Admission: RE | Admit: 2017-06-30 | Discharge: 2017-06-30 | Disposition: A | Discharge status: Home / Self Care | Payer: Medicaid Other | Source: Ambulatory Visit | Attending: Certified Nurse Midwife | Admitting: Certified Nurse Midwife

## 2017-06-30 ENCOUNTER — Encounter: Payer: Self-pay | Admitting: Certified Nurse Midwife

## 2017-06-30 VITALS — BP 137/80 | HR 85 | Wt 150.0 lb

## 2017-06-30 DIAGNOSIS — Z3481 Encounter for supervision of other normal pregnancy, first trimester: Secondary | ICD-10-CM | POA: Diagnosis not present

## 2017-06-30 DIAGNOSIS — Z3480 Encounter for supervision of other normal pregnancy, unspecified trimester: Secondary | ICD-10-CM | POA: Diagnosis not present

## 2017-06-30 DIAGNOSIS — O0990 Supervision of high risk pregnancy, unspecified, unspecified trimester: Secondary | ICD-10-CM | POA: Insufficient documentation

## 2017-06-30 NOTE — Progress Notes (Signed)
hePt is currently taking Diclegis.   Pt states that she feels like her blood sugar has been low after meals. Pt complaints of yeast / BV symptoms.

## 2017-07-01 ENCOUNTER — Encounter: Payer: Self-pay | Admitting: Certified Nurse Midwife

## 2017-07-01 ENCOUNTER — Other Ambulatory Visit: Payer: Self-pay | Admitting: Certified Nurse Midwife

## 2017-07-01 DIAGNOSIS — B373 Candidiasis of vulva and vagina: Secondary | ICD-10-CM

## 2017-07-01 DIAGNOSIS — B3731 Acute candidiasis of vulva and vagina: Secondary | ICD-10-CM

## 2017-07-01 DIAGNOSIS — B9689 Other specified bacterial agents as the cause of diseases classified elsewhere: Secondary | ICD-10-CM

## 2017-07-01 DIAGNOSIS — N760 Acute vaginitis: Principal | ICD-10-CM

## 2017-07-01 LAB — CERVICOVAGINAL ANCILLARY ONLY
BACTERIAL VAGINITIS: POSITIVE — AB
Candida vaginitis: POSITIVE — AB
Chlamydia: NEGATIVE
Neisseria Gonorrhea: NEGATIVE
TRICH (WINDOWPATH): NEGATIVE

## 2017-07-01 MED ORDER — FLUCONAZOLE 200 MG PO TABS: 200.0000 mg | ORAL_TABLET | Freq: Once | ORAL | 0 refills | Status: AC

## 2017-07-01 MED ORDER — SECNIDAZOLE 2 G PO PACK: 1.0000 | PACK | Freq: Once | ORAL | 0 refills | Status: AC

## 2017-07-01 MED ORDER — TERCONAZOLE 0.8 % VA CREA: 1.0000 | TOPICAL_CREAM | Freq: Every day | VAGINAL | 0 refills | Status: DC

## 2017-07-01 NOTE — Progress Notes (Signed)
Subjective:   Alexandra Meadows is a 21 y.o. G2P1001 at 1167w4d by LMP being seen today for her first obstetrical visit.  Her obstetrical history is significant for hx of depression/anxiety. Patient does intend to breast feed. Pregnancy history fully reviewed.  Currently working two jobs: [employer] and [employer].    Patient reports nausea, no bleeding, no contractions, no cramping, no leaking and vomiting.  HISTORY: Obstetric History   G2   P1   T1   P0   A0   L1    SAB0   TAB0   Ectopic0   Multiple0   Live Births1     # Outcome Date GA Lbr Len/2nd Weight Sex Delivery Anes PTL Lv  2 Current           1 Term 04/04/16 9079w4d 08:50 / 00:38 6 lb 15.8 oz (3.17 kg) F Vag-Vacuum EPI  LIV     Name: Gulledge,GIRL Addisyn     Apgar1:  7                Apgar5: 9     Past Medical History:  Diagnosis Date  . Asthma    prn inhaler  . 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 Migraines   . Seasonal allergies   . Tonsillar hypertrophy 04/2016   Past Surgical History:  Procedure Laterality Date  . KNEE ARTHROSCOPY Left 02/28/2014   Procedure: ARTHROSCOPY LEFT KNEE WITH SYNOVECTOMY LIMITED, PARTIAL LATERAL MENISECTOMY;  Surgeon: Loreta Aveaniel F Murphy, MD;  Location: [facility];  Service: Orthopedics;  Laterality: Left;  . TONSILLECTOMY AND ADENOIDECTOMY Bilateral 05/25/2016   Procedure: TONSILLECTOMY AND ADENOIDECTOMY;  Surgeon: Newman PiesSu Teoh, MD;  Location: [facility];  Service: ENT;  Laterality: Bilateral;  . WISDOM TOOTH EXTRACTION  2015   History reviewed. No pertinent family history. Social History   Tobacco Use  . Smoking status: Never Smoker  . Smokeless tobacco: Never Used  . Tobacco comment: quit  Substance Use Topics  . Alcohol use: No    Alcohol/week: 0.0 oz  . Drug use: No   Allergies  Allergen Reactions  . Bee Venom Hives and Swelling  . Tetanus Toxoids Hives  . Xanax [Alprazolam] Other (See Comments)    SEIZURES    Current Outpatient Medications on File Prior to Visit  Medication Sig Dispense Refill  . Doxylamine-Pyridoxine (DICLEGIS) 10-10 MG TBEC Take 1 tablet with breakfast and lunch.  Take 2 tablets at bedtime. 100 tablet 4  . acetaminophen (TYLENOL) 500 MG tablet Take 500-1,000 mg by mouth every 6 (six) hours as needed for mild pain, moderate pain or headache.     . albuterol (PROVENTIL HFA;VENTOLIN HFA) 108 (90 BASE) MCG/ACT inhaler Inhale 2 puffs into the lungs every 4 (four) hours as needed for wheezing or shortness of breath. (Patient not taking: Reported on 05/16/2017) 1 Inhaler 11  . diphenhydrAMINE (BENADRYL) 25 MG tablet Take 25 mg by mouth every 6 (six) hours as needed.    . promethazine (PHENERGAN) 25 MG tablet Take 1 tablet (25 mg total) every 6 (six) hours as needed by mouth for nausea or vomiting. 30 tablet 0   No current facility-administered medications on file prior to visit.      Exam   Vitals:   06/30/17 1516  BP: 137/80  Pulse: 85  Weight: 150 lb (68 kg)   Fetal Heart Rate (bpm): 165-174; doppler  Uterus:  Pelvic Exam: Perineum: no hemorrhoids, normal perineum   Vulva: normal external genitalia, no lesions   Vagina:  normal mucosa, normal discharge   Cervix: no lesions and normal, pap smear done.    Adnexa: normal adnexa and no mass, fullness, tenderness   Bony Pelvis: average  System: General: well-developed, well-nourished female in no acute distress   Breast:  normal appearance, no masses or tenderness   Skin: normal coloration and turgor, no rashes   Neurologic: oriented, normal, negative, normal mood   Extremities: normal strength, tone, and muscle mass, ROM of all joints is normal   HEENT PERRLA, extraocular movement intact and sclera clear, anicteric   Mouth/Teeth mucous membranes moist, pharynx normal without lesions and dental hygiene good   Neck supple and no masses   Cardiovascular: regular rate and rhythm   Respiratory:  no respiratory distress,  normal breath sounds   Abdomen: soft, non-tender; bowel sounds normal; no masses,  no organomegaly     Assessment:   Pregnancy: G2P1001 Patient Active Problem List   Diagnosis Date Noted  . Supervision of other normal pregnancy, antepartum 06/30/2017  . 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 Anxiety state 02/17/2014  . Depression 11/29/2013  . Knee pain 09/27/2013  . 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 WRIST PAIN 07/21/2009  . Allergic rhinitis 06/02/2009  . PARONYCHIA, TOE 04/15/2009  . 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 IRREGULAR MENSES 12/09/2008  . BACK PAIN, THORACIC REGION 05/14/2008  . INSOMNIA 03/20/2008  . ADHD 08/29/2007  . METRORRHAGIA 08/29/2007  . MIGRAINE, COMMON W/O INTRACTABLE MIGRAINE 03/29/2007     Plan:  1. Supervision of other normal pregnancy, antepartum   - Hemoglobinopathy evaluation - Varicella zoster antibody, IgG - VITAMIN D 25 Hydroxy (Vit-D Deficiency, Fractures) - Culture, OB Urine - Cystic Fibrosis Mutation 97 - Obstetric Panel, Including HIV - Cytology - PAP - Cervicovaginal ancillary only - Hemoglobin A1c - MaterniT21 PLUS Core+SCA   Initial labs drawn. Continue prenatal vitamins. Genetic Screening discussed, NIPS: ordered. Ultrasound discussed; fetal anatomic survey: ordered. Problem list reviewed and updated. The nature of [facility] with multiple MDs and other Advanced Practice Providers was explained to patient; also emphasized that residents, students are part of our team. Routine obstetric precautions reviewed. Return in about 4 weeks (around 07/28/2017) for ROB.     Orvilla Cornwallachelle Antwane Grose, CNM [facility], [facility]

## 2017-07-02 LAB — OBSTETRIC PANEL, INCLUDING HIV
Antibody Screen: NEGATIVE
Basophils Absolute: 0 10*3/uL (ref 0.0–0.2)
Basos: 0 %
EOS (ABSOLUTE): 0.1 10*3/uL (ref 0.0–0.4)
Eos: 1 %
HIV Screen 4th Generation wRfx: NONREACTIVE
Hematocrit: 33 % — ABNORMAL LOW (ref 34.0–46.6)
Hemoglobin: 11.4 g/dL (ref 11.1–15.9)
Hepatitis B Surface Ag: NEGATIVE
Immature Grans (Abs): 0 10*3/uL (ref 0.0–0.1)
Immature Granulocytes: 0 %
Lymphocytes Absolute: 3.8 10*3/uL — ABNORMAL HIGH (ref 0.7–3.1)
Lymphs: 36 %
MCH: 27.5 pg (ref 26.6–33.0)
MCHC: 34.5 g/dL (ref 31.5–35.7)
MCV: 80 fL (ref 79–97)
Monocytes Absolute: 0.4 10*3/uL (ref 0.1–0.9)
Monocytes: 4 %
Neutrophils Absolute: 6 10*3/uL (ref 1.4–7.0)
Neutrophils: 59 %
Platelets: 302 10*3/uL (ref 150–379)
RBC: 4.15 x10E6/uL (ref 3.77–5.28)
RDW: 16.2 % — ABNORMAL HIGH (ref 12.3–15.4)
RPR Ser Ql: NONREACTIVE
Rh Factor: POSITIVE
Rubella Antibodies, IGG: 1.99 index (ref >0.99)
WBC: 10.3 10*3/uL (ref 3.4–10.8)

## 2017-07-02 LAB — HEMOGLOBINOPATHY EVALUATION
HEMOGLOBIN F QUANTITATION: 0 % (ref 0.0–2.0)
HGB A: 98 % (ref 96.4–98.8)
HGB C: 0 %
HGB S: 0 %
HGB VARIANT: 0 %
Hemoglobin A2 Quantitation: 2 % (ref 1.8–3.2)

## 2017-07-02 LAB — VITAMIN D 25 HYDROXY (VIT D DEFICIENCY, FRACTURES): VIT D 25 HYDROXY: 24.8 ng/mL — AB (ref 30.0–100.0)

## 2017-07-02 LAB — HEMOGLOBIN A1C
Est. average glucose Bld gHb Est-mCnc: 100 mg/dL
Hgb A1c MFr Bld: 5.1 % (ref 4.8–5.6)

## 2017-07-02 LAB — VARICELLA ZOSTER ANTIBODY, IGG: Varicella zoster IgG: 283 index (ref >165)

## 2017-07-02 LAB — URINE CULTURE, OB REFLEX

## 2017-07-02 LAB — CULTURE, OB URINE

## 2017-07-04 ENCOUNTER — Encounter: Payer: Self-pay | Admitting: *Deleted

## 2017-07-05 ENCOUNTER — Other Ambulatory Visit: Payer: Self-pay | Admitting: Certified Nurse Midwife

## 2017-07-05 ENCOUNTER — Telehealth: Payer: Self-pay | Admitting: Certified Nurse Midwife

## 2017-07-05 ENCOUNTER — Encounter (HOSPITAL_COMMUNITY): Payer: Self-pay | Admitting: *Deleted

## 2017-07-05 ENCOUNTER — Inpatient Hospital Stay (HOSPITAL_COMMUNITY)
Admission: AD | Admit: 2017-07-05 | Discharge: 2017-07-05 | Disposition: A | Discharge status: Home / Self Care | Payer: Medicaid Other | Source: Ambulatory Visit | Attending: Obstetrics & Gynecology | Admitting: Obstetrics & Gynecology

## 2017-07-05 ENCOUNTER — Other Ambulatory Visit: Payer: Self-pay

## 2017-07-05 DIAGNOSIS — Z3480 Encounter for supervision of other normal pregnancy, unspecified trimester: Secondary | ICD-10-CM

## 2017-07-05 DIAGNOSIS — G43909 Migraine, unspecified, not intractable, without status migrainosus: Secondary | ICD-10-CM | POA: Insufficient documentation

## 2017-07-05 DIAGNOSIS — Z3A12 12 weeks gestation of pregnancy: Secondary | ICD-10-CM | POA: Insufficient documentation

## 2017-07-05 DIAGNOSIS — O219 Vomiting of pregnancy, unspecified: Secondary | ICD-10-CM | POA: Diagnosis not present

## 2017-07-05 DIAGNOSIS — O210 Mild hyperemesis gravidarum: Secondary | ICD-10-CM | POA: Diagnosis not present

## 2017-07-05 DIAGNOSIS — G43009 Migraine without aura, not intractable, without status migrainosus: Secondary | ICD-10-CM

## 2017-07-05 DIAGNOSIS — O26891 Other specified pregnancy related conditions, first trimester: Secondary | ICD-10-CM | POA: Insufficient documentation

## 2017-07-05 DIAGNOSIS — W19XXXA Unspecified fall, initial encounter: Secondary | ICD-10-CM

## 2017-07-05 DIAGNOSIS — E559 Vitamin D deficiency, unspecified: Secondary | ICD-10-CM | POA: Insufficient documentation

## 2017-07-05 DIAGNOSIS — M5441 Lumbago with sciatica, right side: Secondary | ICD-10-CM

## 2017-07-05 DIAGNOSIS — W000XXA Fall on same level due to ice and snow, initial encounter: Secondary | ICD-10-CM | POA: Diagnosis not present

## 2017-07-05 DIAGNOSIS — M5442 Lumbago with sciatica, left side: Secondary | ICD-10-CM

## 2017-07-05 LAB — URINALYSIS, ROUTINE W REFLEX MICROSCOPIC
BILIRUBIN URINE: NEGATIVE
GLUCOSE, UA: NEGATIVE mg/dL
HGB URINE DIPSTICK: NEGATIVE
KETONES UR: NEGATIVE mg/dL
Leukocytes, UA: NEGATIVE
Nitrite: NEGATIVE
PROTEIN: NEGATIVE mg/dL
Specific Gravity, Urine: 1.024 (ref 1.005–1.030)
pH: 5 (ref 5.0–8.0)

## 2017-07-05 LAB — MATERNIT21 PLUS CORE+SCA
Chromosome 13: NEGATIVE
Chromosome 18: NEGATIVE
Chromosome 21: NEGATIVE
Y CHROMOSOME: DETECTED

## 2017-07-05 LAB — CYTOLOGY - PAP: Diagnosis: NEGATIVE

## 2017-07-05 MED ORDER — CYCLOBENZAPRINE HCL 5 MG PO TABS
5.0000 mg | ORAL_TABLET | Freq: Once | ORAL | Status: AC
  Administered 2017-07-05: 5 mg via ORAL
  Filled 2017-07-05: qty 1

## 2017-07-05 MED ORDER — LACTATED RINGERS IV SOLN
INTRAVENOUS | Status: DC
  Administered 2017-07-05: 17:00:00 via INTRAVENOUS

## 2017-07-05 MED ORDER — CYCLOBENZAPRINE HCL 5 MG PO TABS: 5.0000 mg | ORAL_TABLET | Freq: Three times a day (TID) | ORAL | 0 refills | Status: DC | PRN

## 2017-07-05 MED ORDER — DEXAMETHASONE SODIUM PHOSPHATE 10 MG/ML IJ SOLN
10.0000 mg | Freq: Once | INTRAMUSCULAR | Status: AC
  Administered 2017-07-05: 10 mg via INTRAVENOUS
  Filled 2017-07-05: qty 1

## 2017-07-05 MED ORDER — VITAMIN D (ERGOCALCIFEROL) 1.25 MG (50000 UNIT) PO CAPS: 50000.0000 [IU] | ORAL_CAPSULE | ORAL | 2 refills | Status: DC

## 2017-07-05 MED ORDER — DIPHENHYDRAMINE HCL 50 MG/ML IJ SOLN
12.5000 mg | Freq: Once | INTRAMUSCULAR | Status: AC
  Administered 2017-07-05: 12.5 mg via INTRAVENOUS
  Filled 2017-07-05: qty 1

## 2017-07-05 MED ORDER — METOCLOPRAMIDE HCL 5 MG/ML IJ SOLN
5.0000 mg | Freq: Once | INTRAMUSCULAR | Status: AC
  Administered 2017-07-05: 5 mg via INTRAVENOUS
  Filled 2017-07-05: qty 2

## 2017-07-05 NOTE — Discharge Instructions (Signed)
Back Pain, Adult Back pain is very common in adults.The cause of back pain is rarely dangerous and the pain often gets better over time.The cause of your back pain may not be known. Some common causes of back pain include:  Strain of the muscles or ligaments supporting the spine.  Wear and tear (degeneration) of the spinal disks.  Arthritis.  Direct injury to the back.  For many people, back pain may return. Since back pain is rarely dangerous, most people can learn to manage this condition on their own. Follow these instructions at home: Watch your back pain for any changes. The following actions may help to lessen any discomfort you are feeling:  Remain active. It is stressful on your back to sit or stand in one place for long periods of time. Do not sit, drive, or stand in one place for more than 30 minutes at a time. Take short walks on even surfaces as soon as you are able.Try to increase the length of time you walk each day.  Exercise regularly as directed by your health care provider. Exercise helps your back heal faster. It also helps avoid future injury by keeping your muscles strong and flexible.  Do not stay in bed.Resting more than 1-2 days can delay your recovery.  Pay attention to your body when you bend and lift. The most comfortable positions are those that put less stress on your recovering back. Always use proper lifting techniques, including: ? Bending your knees. ? Keeping the load close to your body. ? Avoiding twisting.  Find a comfortable position to sleep. Use a firm mattress and lie on your side with your knees slightly bent. If you lie on your back, put a pillow under your knees.  Avoid feeling anxious or stressed.Stress increases muscle tension and can worsen back pain.It is important to recognize when you are anxious or stressed and learn ways to manage it, such as with exercise.  Take medicines only as directed by your health care provider.  Over-the-counter medicines to reduce pain and inflammation are often the most helpful.Your health care provider may prescribe muscle relaxant drugs.These medicines help dull your pain so you can more quickly return to your normal activities and healthy exercise.  Apply ice to the injured area: ? Put ice in a plastic bag. ? Place a towel between your skin and the bag. ? Leave the ice on for 20 minutes, 2-3 times a day for the first 2-3 days. After that, ice and heat may be alternated to reduce pain and spasms.  Maintain a healthy weight. Excess weight puts extra stress on your back and makes it difficult to maintain good posture.  Contact a health care provider if:  You have pain that is not relieved with rest or medicine.  You have increasing pain going down into the legs or buttocks.  You have pain that does not improve in one week.  You have night pain.  You lose weight.  You have a fever or chills. Get help right away if:  You develop new bowel or bladder control problems.  You have unusual weakness or numbness in your arms or legs.  You develop nausea or vomiting.  You develop abdominal pain.  You feel faint. This information is not intended to replace advice given to you by your health care provider. Make sure you discuss any questions you have with your health care provider. Document Released: 07/12/2005 Document Revised: 11/20/2015 Document Reviewed: 11/13/2013 Elsevier Interactive Patient Education  2017 [employer]vier Inc. General Headache Without Cause A headache is pain or discomfort felt around the head or neck area. There are many causes and types of headaches. In some cases, the cause may not be found. Follow these instructions at home: Managing pain  Take over-the-counter and prescription medicines only as told by your doctor.  Lie down in a dark, quiet room when you have a headache.  If directed, apply ice to the head and neck area: ? Put ice in a plastic  bag. ? Place a towel between your skin and the bag. ? Leave the ice on for 20 minutes, 2-3 times per day.  Use a heating pad or hot shower to apply heat to the head and neck area as told by your doctor.  Keep lights dim if bright lights bother you or make your headaches worse. Eating and drinking  Eat meals on a regular schedule.  Lessen how much alcohol you drink.  Lessen how much caffeine you drink, or stop drinking caffeine. General instructions  Keep all follow-up visits as told by your doctor. This is important.  Keep a journal to find out if certain things bring on headaches. For example, write down: ? What you eat and drink. ? How much sleep you get. ? Any change to your diet or medicines.  Relax by getting a massage or doing other relaxing activities.  Lessen stress.  Sit up straight. Do not tighten (tense) your muscles.  Do not use tobacco products. This includes cigarettes, chewing tobacco, or e-cigarettes. If you need help quitting, ask your doctor.  Exercise regularly as told by your doctor.  Get enough sleep. This often means 7-9 hours of sleep. Contact a doctor if:  Your symptoms are not helped by medicine.  You have a headache that feels different than the other headaches.  You feel sick to your stomach (nauseous) or you throw up (vomit).  You have a fever. Get help right away if:  Your headache becomes really bad.  You keep throwing up.  You have a stiff neck.  You have trouble seeing.  You have trouble speaking.  You have pain in the eye or ear.  Your muscles are weak or you lose muscle control.  You lose your balance or have trouble walking.  You feel like you will pass out (faint) or you pass out.  You have confusion. This information is not intended to replace advice given to you by your health care provider. Make sure you discuss any questions you have with your health care provider. Document Released: 04/20/2008 Document Revised:  12/18/2015 Document Reviewed: 11/04/2014 Elsevier Interactive Patient Education  2018 [employer] Morning Sickness Morning sickness is when you feel sick to your stomach (nauseous) during pregnancy. This nauseous feeling may or may not come with vomiting. It often occurs in the morning but can be a problem any time of day. Morning sickness is most common during the first trimester, but it may continue throughout pregnancy. While morning sickness is unpleasant, it is usually harmless unless you develop severe and continual vomiting (hyperemesis gravidarum). This condition requires more intense treatment. What are the causes? The cause of morning sickness is not completely known but seems to be related to normal hormonal changes that occur in pregnancy. What increases the risk? You are at greater risk if you:  Experienced nausea or vomiting before your pregnancy.  Had morning sickness during a previous pregnancy.  Are pregnant with more than one baby, such as twins.  How is this treated? Do not use any medicines (prescription, over-the-counter, or herbal) for morning sickness without first talking to your health care provider. Your health care provider may prescribe or recommend:  Vitamin B6 supplements.  Anti-nausea medicines.  The herbal medicine ginger.  Follow these instructions at home:  Only take over-the-counter or prescription medicines as directed by your health care provider.  Taking multivitamins before getting pregnant can prevent or decrease the severity of morning sickness in most women.  Eat a piece of dry toast or unsalted crackers before getting out of bed in the morning.  Eat five or six small meals a day.  Eat dry and bland foods (rice, baked potato). Foods high in carbohydrates are often helpful.  Do not drink liquids with your meals. Drink liquids between meals.  Avoid greasy, fatty, and spicy foods.  Get someone to cook for you if the smell of any food  causes nausea and vomiting.  If you feel nauseous after taking prenatal vitamins, take the vitamins at night or with a snack.  Snack on protein foods (nuts, yogurt, cheese) between meals if you are hungry.  Eat unsweetened gelatins for desserts.  Wearing an acupressure wristband (worn for sea sickness) may be helpful.  Acupuncture may be helpful.  Do not smoke.  Get a humidifier to keep the air in your house free of odors.  Get plenty of fresh air. Contact a health care provider if:  Your home remedies are not working, and you need medicine.  You feel dizzy or lightheaded.  You are losing weight. Get help right away if:  You have persistent and uncontrolled nausea and vomiting.  You pass out (faint). This information is not intended to replace advice given to you by your health care provider. Make sure you discuss any questions you have with your health care provider. Document Released: 09/02/2006 Document Revised: 12/18/2015 Document Reviewed: 12/27/2012 Elsevier Interactive Patient Education  2017 [employer].

## 2017-07-05 NOTE — MAU Note (Signed)
Pt presents with c/o falling on black ice this morning @ 0645 this morning.  Pt states back & left side has been sore since fall.  States having abdominal cramping. Pt also reports she's been throwing up since this morning, reports had emesis x2 today.  Denies VB.

## 2017-07-05 NOTE — MAU Provider Note (Signed)
Chief Complaint: Fall and Emesis   First Provider Initiated Contact with Patient 07/05/17 1604        SUBJECTIVE HPI: Alexandra Meadows is a 21 y.o. G2P1001 at [redacted]w[redacted]d by LMP who presents to maternity admissions reporting falling on ice this morning.  Now has back pain with spasms.  Had had problems with vomiting of pregnancy, has vomited twice today, not drinking much.  Has migraine headache also. . She denies vaginal bleeding, vaginal itching/burning, urinary symptoms, dizziness, or fever/chills.    Fall  The accident occurred 6 to 12 hours ago. The fall occurred while walking. There was no blood loss. The point of impact was the buttocks (back). The pain is present in the back, buttocks, left upper leg, right upper leg, right lower leg and left lower leg. The pain is moderate. The symptoms are aggravated by ambulation, standing and movement. Associated symptoms include headaches, nausea and vomiting. Pertinent negatives include no abdominal pain, fever, loss of consciousness, numbness, tingling or visual change. She has tried nothing for the symptoms.  Emesis   This is a recurrent problem. The current episode started 1 to 4 weeks ago. The problem occurs 2 to 4 times per day. The problem has been unchanged. There has been no fever. Associated symptoms include headaches. Pertinent negatives include no abdominal pain, dizziness or fever. Treatments tried: Diclegis.  Back Pain  This is a new problem. The current episode started today. The problem occurs constantly. The problem is unchanged. The pain is present in the lumbar spine and sacro-iliac. The quality of the pain is described as aching and cramping. The pain radiates to the left thigh and right thigh. The pain is moderate. The pain is the same all the time. The symptoms are aggravated by position and standing. Stiffness is present all day. Associated symptoms include headaches. Pertinent negatives include no abdominal pain, fever, numbness,  paresis, paresthesias, tingling or weakness.  Headache   This is a new problem. The current episode started today. The problem occurs constantly. The problem has been unchanged. The pain quality is similar to prior headaches. The quality of the pain is described as aching and dull. The pain is moderate. Associated symptoms include back pain, nausea and vomiting. Pertinent negatives include no abdominal pain, dizziness, fever, muscle aches, numbness, sinus pressure, tingling, visual change or weakness. Nothing aggravates the symptoms. She has tried nothing for the symptoms. Her past medical history is significant for migraine headaches.    RN Note: Pt presents with c/o falling on black ice this morning @ 0645 this morning.  Pt states back & left side has been sore since fall.  States having abdominal cramping. Pt also reports she's been throwing up since this morning, reports had emesis x2 today.  Denies VB.     Past Medical History:  Diagnosis Date  . Asthma    prn inhaler  . 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 Migraines   . Seasonal allergies   . Tonsillar hypertrophy 04/2016   Past Surgical History:  Procedure Laterality Date  . KNEE ARTHROSCOPY Left 02/28/2014   Procedure: ARTHROSCOPY LEFT KNEE WITH SYNOVECTOMY LIMITED, PARTIAL LATERAL MENISECTOMY;  Surgeon: Loreta Ave, MD;  Location: [facility];  Service: Orthopedics;  Laterality: Left;  . TONSILLECTOMY AND ADENOIDECTOMY Bilateral 05/25/2016   Procedure: TONSILLECTOMY AND ADENOIDECTOMY;  Surgeon: Newman Pies, MD;  Location: [facility];  Service: ENT;  Laterality: Bilateral;  .  WISDOM TOOTH EXTRACTION  2015   Social History   Socioeconomic History  . Marital status: Single    Spouse name: Not on file  . Number of children: Not on file  . Years of education: Not on file  . Highest education level: Not on file  Social Needs  . Financial resource strain: Not on  file  . Food insecurity - worry: Not on file  . Food insecurity - inability: Not on file  . Transportation needs - medical: Not on file  . Transportation needs - non-medical: Not on file  Occupational History  . Not on file  Tobacco Use  . Smoking status: Never Smoker  . Smokeless tobacco: Never Used  . Tobacco comment: quit  Substance and Sexual Activity  . Alcohol use: No    Alcohol/week: 0.0 oz  . Drug use: No  . Sexual activity: Yes    Partners: Male  Other Topics Concern  . 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   Lives with parents in a 2 story home.  Currently not working.  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  Education: college.   No current facility-administered medications on file prior to encounter.    Current Outpatient Medications on File Prior to Encounter  Medication Sig Dispense Refill  . acetaminophen (TYLENOL) 500 MG tablet Take 500-1,000 mg by mouth every 6 (six) hours as needed for mild pain, moderate pain or headache.     . albuterol (PROVENTIL HFA;VENTOLIN HFA) 108 (90 BASE) MCG/ACT inhaler Inhale 2 puffs into the lungs every 4 (four) hours as needed for wheezing or shortness of breath. (Patient not taking: Reported on 05/16/2017) 1 Inhaler 11  . diphenhydrAMINE (BENADRYL) 25 MG tablet Take 25 mg by mouth every 6 (six) hours as needed.    . Doxylamine-Pyridoxine (DICLEGIS) 10-10 MG TBEC Take 1 tablet with breakfast and lunch.  Take 2 tablets at bedtime. 100 tablet 4  . promethazine (PHENERGAN) 25 MG tablet Take 1 tablet (25 mg total) every 6 (six) hours as needed by mouth for nausea or vomiting. 30 tablet 0  . terconazole (TERAZOL 3) 0.8 % vaginal cream Place 1 applicator vaginally at bedtime. 20 g 0  . Vitamin D, Ergocalciferol, (DRISDOL) 50000 units CAPS capsule Take 1 capsule (50,000 Units total)  by mouth every 7 (seven) days. 30 capsule 2   Allergies  Allergen Reactions  . Bee Venom Hives and Swelling  . Tetanus Toxoids Hives  . Xanax [Alprazolam] Other (See Comments)    SEIZURES    I have reviewed patient's Past Medical Hx, Surgical Hx, Family Hx, Social Hx, medications and allergies.   ROS:  Review of Systems  Constitutional: Negative for fever.  HENT: Negative for sinus pressure.   Gastrointestinal: Positive for nausea and vomiting. Negative for abdominal pain.  Musculoskeletal: Positive for back pain.  Neurological: Positive for headaches. Negative for dizziness, tingling, loss of consciousness, weakness, numbness and paresthesias.    Other systems negative   Physical Exam  Physical Exam Patient Vitals for the past 24 hrs:  BP Temp Temp src Pulse Resp SpO2 Height Weight  07/05/17 1535 137/80 98.6 F (37 C) Oral 91 18 100 % 5' 3.5" (1.613 m) 146 lb 12 oz (66.6 kg)   Constitutional: Well-developed, well-nourished female in no acute distress.  Cardiovascular: normal rate  Respiratory: normal effort GI: Abd soft, non-tender. Pos BS x 4 MS: Extremities nontender, no edema, normal ROM Neurologic: Alert and oriented x 4.  GU: Neg CVAT.  PELVIC EXAM: deferred  FHT 174 by doppler  LAB RESULTS Results for orders placed or performed during the hospital encounter of 07/05/17 (from the past 24 hour(s))  Urinalysis, Routine w reflex microscopic     Status: Abnormal   Collection Time: 07/05/17  3:44 PM  Result Value Ref Range   Color, Urine YELLOW YELLOW   APPearance HAZY (A) CLEAR   Specific Gravity, Urine 1.024 1.005 - 1.030   pH 5.0 5.0 - 8.0   Glucose, UA NEGATIVE NEGATIVE mg/dL   Hgb urine dipstick NEGATIVE NEGATIVE   Bilirubin Urine NEGATIVE NEGATIVE   Ketones, ur NEGATIVE NEGATIVE mg/dL   Protein, ur NEGATIVE NEGATIVE mg/dL   Nitrite NEGATIVE NEGATIVE   Leukocytes, UA NEGATIVE NEGATIVE    O/Positive/-- (12/06 1627)  IMAGING No results found.  MAU  Management/MDM: IV started to rehydrate her Headache cocktail ordered, with Reglan, Benadryl, and Decadron.  States she got good relief from these Flexeril 5mg  given for back spasm.  States back feels better Discussed she will likely be sore for several days. No gross neuro deficits, able to bear weight bilaterally Able to tolerate liquids.  Wants to go home. States feels better all around.  ASSESSMENT 1. Supervision of other normal pregnancy, antepartum   2.      SIUP at 3122w1d 3.      Nausea and vomiting, improved 4.     Migraine, improved 5.      S/P Fall 6.      Back pain secondary to #5, likely spasm  PLAN Discharge home Rx Flexeril for spasm Conservative care Continue diclegis and phenergan  Allergies as of 07/05/2017      Reactions   Bee Venom Hives, Swelling   Tetanus Toxoids Hives   Xanax [alprazolam] Other (See Comments)   SEIZURES      Medication List    TAKE these medications   acetaminophen 500 MG tablet Commonly known as:  TYLENOL Take 500-1,000 mg by mouth every 6 (six) hours as needed for mild pain, moderate pain or headache.   albuterol 108 (90 Base) MCG/ACT inhaler Commonly known as:  PROVENTIL HFA;VENTOLIN HFA Inhale 2 puffs into the lungs every 4 (four) hours as needed for wheezing or shortness of breath.   cyclobenzaprine 5 MG tablet Commonly known as:  FLEXERIL Take 1 tablet (5 mg total) by mouth 3 (three) times daily as needed for muscle spasms.   diphenhydrAMINE 25 MG tablet Commonly known as:  BENADRYL Take 25 mg by mouth every 6 (six) hours as needed.   Doxylamine-Pyridoxine 10-10 MG Tbec Commonly known as:  DICLEGIS Take 1 tablet with breakfast and lunch.  Take 2 tablets at bedtime.   promethazine 25 MG tablet Commonly known as:  PHENERGAN Take 1 tablet (25 mg total) every 6 (six) hours as needed by mouth for nausea or vomiting.   terconazole 0.8 % vaginal cream Commonly known as:  TERAZOL 3 Place 1 applicator vaginally at  bedtime.   Vitamin D (Ergocalciferol) 50000 units Caps capsule Commonly known as:  DRISDOL Take 1 capsule (50,000 Units total) by mouth every 7 (seven) days.       Pt stable at time of discharge. Encouraged to return here or to other Urgent Care/ED if she develops worsening of symptoms, increase in pain, fever, or other concerning symptoms.  Wynelle BourgeoisMarie Rc Amison CNM, MSN Certified Nurse-Midwife 07/05/2017  4:04 PM

## 2017-07-08 LAB — CYSTIC FIBROSIS MUTATION 97: GENE DIS ANAL CARRIER INTERP BLD/T-IMP: NOT DETECTED

## 2017-07-10 ENCOUNTER — Inpatient Hospital Stay (HOSPITAL_COMMUNITY)
Admission: AD | Admit: 2017-07-10 | Discharge: 2017-07-10 | Disposition: A | Discharge status: Home / Self Care | Payer: Medicaid Other | Source: Ambulatory Visit | Attending: Obstetrics & Gynecology | Admitting: Obstetrics & Gynecology

## 2017-07-10 ENCOUNTER — Encounter (HOSPITAL_COMMUNITY): Payer: Self-pay

## 2017-07-10 ENCOUNTER — Other Ambulatory Visit: Payer: Self-pay

## 2017-07-10 DIAGNOSIS — J069 Acute upper respiratory infection, unspecified: Secondary | ICD-10-CM

## 2017-07-10 DIAGNOSIS — O9989 Other specified diseases and conditions complicating pregnancy, childbirth and the puerperium: Secondary | ICD-10-CM

## 2017-07-10 DIAGNOSIS — Z3491 Encounter for supervision of normal pregnancy, unspecified, first trimester: Secondary | ICD-10-CM

## 2017-07-10 DIAGNOSIS — Z3A12 12 weeks gestation of pregnancy: Secondary | ICD-10-CM | POA: Insufficient documentation

## 2017-07-10 DIAGNOSIS — R05 Cough: Secondary | ICD-10-CM | POA: Diagnosis present

## 2017-07-10 DIAGNOSIS — O99511 Diseases of the respiratory system complicating pregnancy, first trimester: Secondary | ICD-10-CM | POA: Insufficient documentation

## 2017-07-10 DIAGNOSIS — R059 Cough, unspecified: Secondary | ICD-10-CM

## 2017-07-10 MED ORDER — AZITHROMYCIN 250 MG PO TABS
ORAL_TABLET | ORAL | 0 refills | Status: DC
Start: 2017-07-10 — End: 2017-08-10

## 2017-07-10 MED ORDER — HYDROCOD POLST-CPM POLST ER 10-8 MG/5ML PO SUER: 5.0000 mL | Freq: Two times a day (BID) | ORAL | 0 refills | Status: DC | PRN

## 2017-07-10 NOTE — MAU Provider Note (Signed)
History     CSN: [id number]  Arrival date and time: 07/10/17 1449   None     Chief Complaint  Patient presents with  . Cough  . Fall    fell 4 days ago    HPI  Alexandra Meadows is 21 y.o. G2P1001 3661w6d weeks presenting for evaluation for cough that began 1 week ago.  Now with chest involvement that began 2 days ago. Tried benadryl, Mucinex D, sudafed with out relief of sxs.  Can't sleep, no appetite.  Is drinking water.  Has hot and cold chills , neg for fever.  Took Sudafed this am.  The cough is her major concern, keeping her from resting--needs to sleep to work and care for 6215 mo old.  Diarrhea X 5 yesterday evening.  Vomited X 3 yesterday.  Only nausea today.  Neg for vaginal bleeding. Took at Z pack 1 month ago that "knocked out respiratory symptoms".  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  Reported an injury last week at work when a [employer] fell on her left leg and she was seen in MAU on Tuesday after falling on black ice.  She reports continued leg and back pain from these injuries.  Has not used anything for this discomfort because "nothing works".     Past Medical History:  Diagnosis Date  . Asthma    prn inhaler  . 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 Migraines   . Seasonal allergies   . Tonsillar hypertrophy 04/2016    Past Surgical History:  Procedure Laterality Date  . KNEE ARTHROSCOPY Left 02/28/2014   Procedure: ARTHROSCOPY LEFT KNEE WITH SYNOVECTOMY LIMITED, PARTIAL LATERAL MENISECTOMY;  Surgeon: Loreta Aveaniel F Murphy, MD;  Location: [facility];  Service: Orthopedics;  Laterality: Left;  . TONSILLECTOMY AND ADENOIDECTOMY Bilateral 05/25/2016   Procedure: TONSILLECTOMY AND ADENOIDECTOMY;  Surgeon: Newman PiesSu Teoh, MD;  Location: [facility];  Service: ENT;  Laterality: Bilateral;  . WISDOM TOOTH EXTRACTION  2015    No family history on file.  Social History    Tobacco Use  . Smoking status: Never Smoker  . Smokeless tobacco: Never Used  . Tobacco comment: quit  Substance Use Topics  . Alcohol use: No    Alcohol/week: 0.0 oz  . Drug use: No    Allergies:  Allergies  Allergen Reactions  . Bee Venom Hives and Swelling  . Tetanus Toxoids Hives  . Xanax [Alprazolam] Other (See Comments)    SEIZURES    Medications Prior to Admission  Medication Sig Dispense Refill Last Dose  . acetaminophen (TYLENOL) 500 MG tablet Take 500-1,000 mg by mouth every 6 (six) hours as needed for mild pain, moderate pain or headache.    Not Taking  . albuterol (PROVENTIL HFA;VENTOLIN HFA) 108 (90 BASE) MCG/ACT inhaler Inhale 2 puffs into the lungs every 4 (four) hours as needed for wheezing or shortness of breath. (Patient not taking: Reported on 05/16/2017) 1 Inhaler 11 Not Taking  . cyclobenzaprine (FLEXERIL) 5 MG tablet Take 1 tablet (5 mg total) by mouth 3 (three) times daily as needed for muscle spasms. 15 tablet 0   . diphenhydrAMINE (BENADRYL) 25 MG tablet Take 25 mg by mouth every 6 (six) hours as needed.   Not Taking  . Doxylamine-Pyridoxine (DICLEGIS) 10-10 MG TBEC Take 1 tablet with breakfast and lunch.  Take 2 tablets at bedtime. 100  tablet 4 Taking  . promethazine (PHENERGAN) 25 MG tablet Take 1 tablet (25 mg total) every 6 (six) hours as needed by mouth for nausea or vomiting. 30 tablet 0   . terconazole (TERAZOL 3) 0.8 % vaginal cream Place 1 applicator vaginally at bedtime. 20 g 0   . Vitamin D, Ergocalciferol, (DRISDOL) 50000 units CAPS capsule Take 1 capsule (50,000 Units total) by mouth every 7 (seven) days. 30 capsule 2     Review of Systems  Constitutional: Positive for appetite change, chills and fever.  HENT: Positive for congestion.   Respiratory: Positive for cough.        Congestion  Cardiovascular: Negative for chest pain.  Gastrointestinal: Positive for abdominal pain (with vomiting and diarrhea), diarrhea, nausea and vomiting.   Genitourinary: Negative for vaginal bleeding.  Musculoskeletal: Positive for back pain (from fall earliler this wee).       Leg pain from injury at work.   Physical Exam   Blood pressure (!) 100/55, pulse 89, temperature 98.2 F (36.8 C), temperature source Oral, resp. rate 20, last menstrual period 04/11/2017, currently breastfeeding.  Physical Exam  Nursing note and vitals reviewed. Constitutional: She is oriented to person, place, and time. She appears well-developed and well-nourished. No distress.  HENT:  Head: Normocephalic.  Neck: Normal range of motion.  Cardiovascular: Normal rate and regular rhythm.  Respiratory: Effort normal and breath sounds normal. No respiratory distress. She has no wheezes. She has no rales. She exhibits no tenderness.  + for cough  Neurological: She is alert and oriented to person, place, and time.  Skin: Skin is warm and dry.  Psychiatric: She has a normal mood and affect. Her behavior is normal.   MAU Course  Procedures  MDM MSE Exam  Assessment and Plan  A:  URI      Cough      10069w6d gestation       Recent injury at work and on black ice  P:  Rx for cough med and Zpack       Call [facility] for worsening sxs        Keep schedule appt for early January Eve M Key 07/10/2017, 4:13 PM

## 2017-07-10 NOTE — Discharge Instructions (Signed)
Upper Respiratory Infection, Adult Most upper respiratory infections (URIs) are caused by a virus. A URI affects the nose, throat, and upper air passages. The most common type of URI is often called "the common cold." Follow these instructions at home:  Take medicines only as told by your doctor.  Gargle warm saltwater or take cough drops to comfort your throat as told by your doctor.  Use a warm mist humidifier or inhale steam from a shower to increase air moisture. This may make it easier to breathe.  Drink enough fluid to keep your pee (urine) clear or pale yellow.  Eat soups and other clear broths.  Have a healthy diet.  Rest as needed.  Go back to work when your fever is gone or your doctor says it is okay. ? You may need to stay home longer to avoid giving your URI to others. ? You can also wear a face mask and wash your hands often to prevent spread of the virus.  Use your inhaler more if you have asthma.  Do not use any tobacco products, including cigarettes, chewing tobacco, or electronic cigarettes. If you need help quitting, ask your doctor. Contact a doctor if:  You are getting worse, not better.  Your symptoms are not helped by medicine.  You have chills.  You are getting more short of breath.  You have brown or red mucus.  You have yellow or brown discharge from your nose.  You have pain in your face, especially when you bend forward.  You have a fever.  You have puffy (swollen) neck glands.  You have pain while swallowing.  You have white areas in the back of your throat. Get help right away if:  You have very bad or constant: ? Headache. ? Ear pain. ? Pain in your forehead, behind your eyes, and over your cheekbones (sinus pain). ? Chest pain.  You have long-lasting (chronic) lung disease and any of the following: ? Wheezing. ? Long-lasting cough. ? Coughing up blood. ? A change in your usual mucus.  You have a stiff neck.  You have  changes in your: ? Vision. ? Hearing. ? Thinking. ? Mood. This information is not intended to replace advice given to you by your health care provider. Make sure you discuss any questions you have with your health care provider. Document Released: 12/29/2007 Document Revised: 03/14/2016 Document Reviewed: 10/17/2013 Elsevier Interactive Patient Education  2018 Elsevier Inc.  

## 2017-07-10 NOTE — Progress Notes (Addendum)
G2P1 @ 12.[redacted] wksga. Presents to triage for coughing that started a week ago and worse on Friday. States daughter goes to day care and contracted the cold and possibly got it from her.   Also was at work [employer] (4 days ago) a pt left equipment fell on left leg. Significant bruising on the leg noted. States did not get hit on the abdomen and denies pain in the abdomen.   Denies bleeding.   Instructed pt to wear mask when in public area with other people due to coughing. Mask given  1610: provider at bs assessing.

## 2017-07-12 ENCOUNTER — Telehealth: Payer: Self-pay | Admitting: Pediatrics

## 2017-07-12 NOTE — Telephone Encounter (Signed)
PA required for [facility].  PA submitted to [facility].  Confirmation # [id number]  No failure's in chart.  I submitted PA d/t vomiting in pregnancy treated w/ Diclegis

## 2017-07-13 ENCOUNTER — Other Ambulatory Visit: Payer: Self-pay | Admitting: Certified Nurse Midwife

## 2017-07-13 DIAGNOSIS — Z3480 Encounter for supervision of other normal pregnancy, unspecified trimester: Secondary | ICD-10-CM

## 2017-07-13 NOTE — Telephone Encounter (Signed)
PA approved.   PA# [id number].  Pharmacy is filling the rx. Left msg for patient.

## 2017-07-26 NOTE — L&D Delivery Note (Signed)
Patient is a 22 y.o. now G2P2 s/p NSVD at 7177w1d, who was admitted for IOL for umbilical vein varix.  She progressed with augmentation (Cytotec, FB,Pitocin, AROM) to complete and pushed 15 minutes to deliver.  Cord clamping delayed by several minutes then clamped by medical student Belson and cut by FOB.  Placenta intact and spontaneous, bleeding minimal.  Bilateral labial lacerations repaired without difficulty.  Mom and baby stable prior to transfer to postpartum. She plans on breastfeeding. She is unsure method for birth control.  Delivery Note At 1:45 AM a viable and healthy female was delivered via Vaginal, Spontaneous (Presentation: LOA ).  APGAR: 7, 8; weight 5 lb 14 oz (2665 g).   Placenta intact and spontaneous, bleeding minimal. 3VCord:  with the following complications: Umbilical vein varix.   Anesthesia:  Epidural  Episiotomy: None Lacerations: Labial Suture Repair: 4.0 monocryl  Est. Blood Loss (mL):  100  Mom to postpartum.  Baby to Couplet care / Skin to Skin.  Sharyon CableVeronica C Madolyn Ackroyd CNM 12/27/2017, 2:22 AM

## 2017-07-28 ENCOUNTER — Other Ambulatory Visit: Payer: Self-pay

## 2017-07-28 ENCOUNTER — Ambulatory Visit (INDEPENDENT_AMBULATORY_CARE_PROVIDER_SITE_OTHER): Payer: Medicaid Other | Admitting: Nurse Practitioner

## 2017-07-28 DIAGNOSIS — Z3480 Encounter for supervision of other normal pregnancy, unspecified trimester: Secondary | ICD-10-CM

## 2017-07-28 NOTE — Progress Notes (Signed)
    Subjective:  Alexandra Meadows is a 22 y.o. G2P1001 at 3479w3d being seen today for ongoing prenatal care.  She is currently monitored for the following issues for this low-risk pregnancy and has ADHD; MIGRAINE, COMMON W/O INTRACTABLE MIGRAINE; IRREGULAR MENSES; METRORRHAGIA; PARONYCHIA, TOE; STRIAE ATROPHICAE; KELOID SCAR; WRIST PAIN; BACK PAIN, THORACIC REGION; INSOMNIA; Knee pain; Depression; Anxiety state; Intrinsic asthma; Supervision of other normal pregnancy, antepartum; and Vitamin D deficiency on their problem list.  Patient reports no complaints.  Contractions: Not present. Vag. Bleeding: None.  Movement: Present. Denies leaking of fluid. Still takes Diclegis at night and once during the day.  Is helping nausea and is not having vomiting.  The following portions of the patient's history were reviewed and updated as appropriate: allergies, current medications, past family history, past medical history, past social history, past surgical history and problem list. Problem list updated.  Objective:   Vitals:   07/28/17 1550  BP: 115/67  Pulse: 77  Weight: 149 lb 3.2 oz (67.7 kg)    Fetal Status: Fetal Heart Rate (bpm): 154; doppler Fundal Height: 15 cm Movement: Present     General:  Alert, oriented and cooperative. Patient is in no acute distress.  Skin: Skin is warm and dry. No rash noted.   Cardiovascular: Normal heart rate noted  Respiratory: Normal respiratory effort, no problems with respiration noted  Abdomen: Soft, gravid, appropriate for gestational age. Pain/Pressure: Present     Pelvic:  Cervical exam deferred        Extremities: Normal range of motion.  Edema: Trace  Mental Status: Normal mood and affect. Normal behavior. Normal judgment and thought content.   Urinalysis:      Assessment and Plan:  Pregnancy: G2P1001 at 3679w3d  1. Supervision of other normal pregnancy, antepartum  - AFP, Serum, Open Spina Bifida - US MFM OB COMP + 14 WK; Future  Preterm  labor symptoms and general obstetric precautions including but not limited to vaginal bleeding, contractions, leaking of fluid and fetal movement were reviewed in detail with the patient. Please refer to After Visit Summary for other counseling recommendations.  Return in about 4 weeks (around 08/25/2017).  Nolene BernheimERRI Krew Hortman, RN, MSN, NP-BC Nurse Practitioner, [facility], [facility] 07/28/2017 5:13 PM

## 2017-07-28 NOTE — Patient Instructions (Addendum)
Second Trimester of Pregnancy The second trimester is from week 13 through week 28, month 4 through 6. This is often the time in pregnancy that you feel your best. Often times, morning sickness has lessened or quit. You may have more energy, and you may get hungry more often. Your unborn baby (fetus) is growing rapidly. At the end of the sixth month, he or she is about 9 inches long and weighs about 1 pounds. You will likely feel the baby move (quickening) between 18 and 20 weeks of pregnancy. Follow these instructions at home:  Avoid all smoking, herbs, and alcohol. Avoid drugs not approved by your doctor.  Do not use any tobacco products, including cigarettes, chewing tobacco, and electronic cigarettes. If you need help quitting, ask your doctor. You may get counseling or other support to help you quit.  Only take medicine as told by your doctor. Some medicines are safe and some are not during pregnancy.  Exercise only as told by your doctor. Stop exercising if you start having cramps.  Eat regular, healthy meals.  Wear a good support bra if your breasts are tender.  Do not use hot tubs, steam rooms, or saunas.  Wear your seat belt when driving.  Avoid raw meat, uncooked cheese, and liter boxes and soil used by cats.  Take your prenatal vitamins.  Take 1500-2000 milligrams of calcium daily starting at the 20th week of pregnancy until you deliver your baby.  Try taking medicine that helps you poop (stool softener) as needed, and if your doctor approves. Eat more fiber by eating fresh fruit, vegetables, and whole grains. Drink enough fluids to keep your pee (urine) clear or pale yellow.  Take warm water baths (sitz baths) to soothe pain or discomfort caused by hemorrhoids. Use hemorrhoid cream if your doctor approves.  If you have puffy, bulging veins (varicose veins), wear support hose. Raise (elevate) your feet for 15 minutes, 3-4 times a day. Limit salt in your diet.  Avoid heavy  lifting, wear low heals, and sit up straight.  Rest with your legs raised if you have leg cramps or low back pain.  Visit your dentist if you have not gone during your pregnancy. Use a soft toothbrush to brush your teeth. Be gentle when you floss.  You can have sex (intercourse) unless your doctor tells you not to.  Go to your doctor visits. Get help if:  You feel dizzy.  You have mild cramps or pressure in your lower belly (abdomen).  You have a nagging pain in your belly area.  You continue to feel sick to your stomach (nauseous), throw up (vomit), or have watery poop (diarrhea).  You have bad smelling fluid coming from your vagina.  You have pain with peeing (urination). Get help right away if:  You have a fever.  You are leaking fluid from your vagina.  You have spotting or bleeding from your vagina.  You have severe belly cramping or pain.  You lose or gain weight rapidly.  You have trouble catching your breath and have chest pain.  You notice sudden or extreme puffiness (swelling) of your face, hands, ankles, feet, or legs.  You have not felt the baby move in over an hour.  You have severe headaches that do not go away with medicine.  You have vision changes. This information is not intended to replace advice given to you by your health care provider. Make sure you discuss any questions you have with your health care   provider. Document Released: 10/06/2009 Document Revised: 12/18/2015 Document Reviewed: 09/12/2012 Elsevier Interactive Patient Education  2017 Elsevier Inc.  

## 2017-08-03 LAB — AFP, SERUM, OPEN SPINA BIFIDA
AFP MoM: 0.83
AFP Value: 23.8 ng/mL
GEST. AGE ON COLLECTION DATE: 15.4 wk
MATERNAL AGE AT EDD: 22.2 a
OSBR Risk 1 IN: 10000
TEST RESULTS AFP: NEGATIVE
Weight: 149 [lb_av]

## 2017-08-10 ENCOUNTER — Encounter: Payer: Self-pay | Admitting: Obstetrics

## 2017-08-10 ENCOUNTER — Ambulatory Visit (INDEPENDENT_AMBULATORY_CARE_PROVIDER_SITE_OTHER): Payer: Medicaid Other | Admitting: Obstetrics

## 2017-08-10 VITALS — BP 118/75 | HR 76 | Wt 150.2 lb

## 2017-08-10 DIAGNOSIS — Z3480 Encounter for supervision of other normal pregnancy, unspecified trimester: Secondary | ICD-10-CM

## 2017-08-10 DIAGNOSIS — N898 Other specified noninflammatory disorders of vagina: Secondary | ICD-10-CM

## 2017-08-10 DIAGNOSIS — Z3482 Encounter for supervision of other normal pregnancy, second trimester: Secondary | ICD-10-CM

## 2017-08-10 NOTE — Progress Notes (Signed)
Subjective:  Alexandra Meadows is a 22 y.o. G2P1001 at 2272w2d being seen today for ongoing prenatal care.  She is currently monitored for the following issues for this low-risk pregnancy and has ADHD; MIGRAINE, COMMON W/O INTRACTABLE MIGRAINE; IRREGULAR MENSES; METRORRHAGIA; PARONYCHIA, TOE; STRIAE ATROPHICAE; KELOID SCAR; WRIST PAIN; BACK PAIN, THORACIC REGION; INSOMNIA; Knee pain; Depression; Anxiety state; Intrinsic asthma; Supervision of other normal pregnancy, antepartum; and Vitamin D deficiency on their problem list.  Patient reports malodorous vaginal discharge.  Contractions: Irritability. Vag. Bleeding: None.  Movement: Present. Denies leaking of fluid.   The following portions of the patient's history were reviewed and updated as appropriate: allergies, current medications, past family history, past medical history, past social history, past surgical history and problem list. Problem list updated.  Objective:   Vitals:   08/10/17 1528  BP: 118/75  Pulse: 76  Weight: 150 lb 3.2 oz (68.1 kg)    Fetal Status: Fetal Heart Rate (bpm): 150   Movement: Present     General:  Alert, oriented and cooperative. Patient is in no acute distress.  Skin: Skin is warm and dry. No rash noted.   Cardiovascular: Normal heart rate noted  Respiratory: Normal respiratory effort, no problems with respiration noted  Abdomen: Soft, gravid, appropriate for gestational age. Pain/Pressure: Absent     Pelvic:  Cervical exam deferred        Extremities: Normal range of motion.  Edema: Trace  Mental Status: Normal mood and affect. Normal behavior. Normal judgment and thought content.   Urinalysis:      Assessment and Plan:  Pregnancy: G2P1001 at 2272w2d   Preterm labor symptoms and general obstetric precautions including but not limited to vaginal bleeding, contractions, leaking of fluid and fetal movement were reviewed in detail with the patient. Please refer to After Visit Summary for other counseling  recommendations.  Return for ROB.   Brock BadHarper, Maranatha Grossi A, MD

## 2017-08-11 ENCOUNTER — Other Ambulatory Visit: Payer: Self-pay | Admitting: Obstetrics

## 2017-08-11 ENCOUNTER — Telehealth: Payer: Self-pay

## 2017-08-11 DIAGNOSIS — M62830 Muscle spasm of back: Secondary | ICD-10-CM

## 2017-08-11 MED ORDER — CYCLOBENZAPRINE HCL 10 MG PO TABS: 10.0000 mg | ORAL_TABLET | Freq: Three times a day (TID) | ORAL | 2 refills | Status: DC | PRN

## 2017-08-11 MED ORDER — METRONIDAZOLE 500 MG PO TABS
500.0000 mg | ORAL_TABLET | Freq: Two times a day (BID) | ORAL | 2 refills | Status: DC
Start: 2017-08-11 — End: 2017-08-31

## 2017-08-11 NOTE — Addendum Note (Signed)
Addended by: Brock BadHARPER, CHARLES A on: 08/11/2017 08:31 AM   Modules accepted: Orders

## 2017-08-11 NOTE — Telephone Encounter (Signed)
Patient notified

## 2017-08-11 NOTE — Telephone Encounter (Signed)
-----   Message from Brock Badharles A Harper, MD sent at 08/11/2017  8:29 AM EST ----- Flagyl Rx for BV Flexeril Rx for muscle spasm

## 2017-08-15 ENCOUNTER — Encounter (HOSPITAL_COMMUNITY): Payer: Self-pay | Admitting: Nurse Practitioner

## 2017-08-22 ENCOUNTER — Ambulatory Visit (HOSPITAL_COMMUNITY)
Admission: RE | Admit: 2017-08-22 | Discharge: 2017-08-22 | Disposition: A | Discharge status: Home / Self Care | Payer: Medicaid Other | Source: Ambulatory Visit | Attending: Nurse Practitioner | Admitting: Nurse Practitioner

## 2017-08-22 ENCOUNTER — Other Ambulatory Visit: Payer: Self-pay | Admitting: Nurse Practitioner

## 2017-08-22 DIAGNOSIS — Z3482 Encounter for supervision of other normal pregnancy, second trimester: Secondary | ICD-10-CM | POA: Diagnosis not present

## 2017-08-22 DIAGNOSIS — Z3480 Encounter for supervision of other normal pregnancy, unspecified trimester: Secondary | ICD-10-CM | POA: Diagnosis present

## 2017-08-22 DIAGNOSIS — Z3A19 19 weeks gestation of pregnancy: Secondary | ICD-10-CM

## 2017-08-22 DIAGNOSIS — Z369 Encounter for antenatal screening, unspecified: Secondary | ICD-10-CM

## 2017-08-25 ENCOUNTER — Encounter: Payer: Self-pay | Admitting: Certified Nurse Midwife

## 2017-08-25 ENCOUNTER — Telehealth: Payer: Self-pay | Admitting: Pediatrics

## 2017-08-25 NOTE — Telephone Encounter (Signed)
Pt called stating she needs to cancel today's appointment and reschedule. She states she was placed on hold for 10 minutes and could not wait any longer.  She requests someone call her back to reschedule this appt.

## 2017-08-31 ENCOUNTER — Encounter: Payer: Self-pay | Admitting: Certified Nurse Midwife

## 2017-08-31 ENCOUNTER — Ambulatory Visit (INDEPENDENT_AMBULATORY_CARE_PROVIDER_SITE_OTHER): Payer: Medicaid Other | Admitting: Certified Nurse Midwife

## 2017-08-31 VITALS — BP 118/81 | HR 78 | Wt 153.6 lb

## 2017-08-31 DIAGNOSIS — O99342 Other mental disorders complicating pregnancy, second trimester: Secondary | ICD-10-CM

## 2017-08-31 DIAGNOSIS — E559 Vitamin D deficiency, unspecified: Secondary | ICD-10-CM

## 2017-08-31 DIAGNOSIS — Z3482 Encounter for supervision of other normal pregnancy, second trimester: Secondary | ICD-10-CM

## 2017-08-31 DIAGNOSIS — F329 Major depressive disorder, single episode, unspecified: Secondary | ICD-10-CM

## 2017-08-31 DIAGNOSIS — Z3480 Encounter for supervision of other normal pregnancy, unspecified trimester: Secondary | ICD-10-CM

## 2017-08-31 MED ORDER — SERTRALINE HCL 100 MG PO TABS: 100.0000 mg | ORAL_TABLET | Freq: Every day | ORAL | 12 refills | Status: DC

## 2017-08-31 NOTE — Progress Notes (Signed)
   PRENATAL VISIT NOTE  Subjective:  Alexandra Meadows is a 22 y.o. G2P1001 at 1166w2d being seen today for ongoing prenatal care.  She is currently monitored for the following issues for this low-risk pregnancy and has ADHD; MIGRAINE, COMMON W/O INTRACTABLE MIGRAINE; METRORRHAGIA; INSOMNIA; Depression; Anxiety state; Intrinsic asthma; Supervision of other normal pregnancy, antepartum; and Vitamin D deficiency on their problem list.  Denies Homicidal/suicidal thoughts.  Does have her mom as a support system.  Depression screening: 17.  Discussed medications and counseling.    Patient reports no bleeding, no contractions, no cramping, no leaking and carpel tunnel symptoms.  Contractions: Not present. Vag. Bleeding: None.  Movement: Present. Denies leaking of fluid.   The following portions of the patient's history were reviewed and updated as appropriate: allergies, current medications, past family history, past medical history, past social history, past surgical history and problem list. Problem list updated.  Objective:   Vitals:   08/31/17 1600  BP: 118/81  Pulse: 78  Weight: 153 lb 9.6 oz (69.7 kg)    Fetal Status: Fetal Heart Rate (bpm): 156; doppler Fundal Height: 20 cm Movement: Present     General:  Alert, oriented and cooperative. Patient is in no acute distress.  Skin: Skin is warm and dry. No rash noted.   Cardiovascular: Normal heart rate noted  Respiratory: Normal respiratory effort, no problems with respiration noted  Abdomen: Soft, gravid, appropriate for gestational age.  Pain/Pressure: Present     Pelvic: Cervical exam deferred        Extremities: Normal range of motion.  Edema: Trace  Mental Status:  Normal mood and affect. Normal behavior. Normal judgment and thought content.   Assessment and Plan:  Pregnancy: G2P1001 at 7066w2d  1. Supervision of other normal pregnancy, antepartum     FOB present for exam.  States is having difficulties at work.   2. Depression  affecting pregnancy in second trimester, antepartum     Zoloft increased to 100 mg.   - sertraline (ZOLOFT) 100 MG tablet; Take 1 tablet (100 mg total) by mouth daily.  Dispense: 30 tablet; Refill: 12 - Ambulatory referral to Integrated Behavioral Health  3. Vitamin D deficiency     Taking weekly vitamin D  Preterm labor symptoms and general obstetric precautions including but not limited to vaginal bleeding, contractions, leaking of fluid and fetal movement were reviewed in detail with the patient. Please refer to After Visit Summary for other counseling recommendations.  Return in about 4 weeks (around 09/28/2017) for ROB.   Roe Coombsachelle A Tarra Pence, CNM

## 2017-09-01 ENCOUNTER — Telehealth: Payer: Self-pay

## 2017-09-01 ENCOUNTER — Encounter: Payer: Self-pay | Admitting: *Deleted

## 2017-09-01 NOTE — Telephone Encounter (Signed)
Return TC to pt regarding message   Pt states she cannot sleep at night   Pt has been taking zoloft at night.  Consulted w/ Orvilla Cornwallachelle Denney CNM   Pt needs to take zoloft in the am and can take benadryl or unisom  Unable to reach pt at this time left detailed message for pt to contact the office.

## 2017-09-04 ENCOUNTER — Encounter (HOSPITAL_COMMUNITY): Payer: Self-pay

## 2017-09-04 ENCOUNTER — Inpatient Hospital Stay (HOSPITAL_COMMUNITY)
Admission: AD | Admit: 2017-09-04 | Discharge: 2017-09-04 | Disposition: A | Discharge status: Home / Self Care | Payer: Medicaid Other | Source: Ambulatory Visit | Attending: Obstetrics & Gynecology | Admitting: Obstetrics & Gynecology

## 2017-09-04 DIAGNOSIS — O219 Vomiting of pregnancy, unspecified: Secondary | ICD-10-CM | POA: Diagnosis present

## 2017-09-04 DIAGNOSIS — A084 Viral intestinal infection, unspecified: Secondary | ICD-10-CM | POA: Diagnosis not present

## 2017-09-04 DIAGNOSIS — O99512 Diseases of the respiratory system complicating pregnancy, second trimester: Secondary | ICD-10-CM | POA: Diagnosis not present

## 2017-09-04 DIAGNOSIS — J45909 Unspecified asthma, uncomplicated: Secondary | ICD-10-CM | POA: Insufficient documentation

## 2017-09-04 DIAGNOSIS — R197 Diarrhea, unspecified: Secondary | ICD-10-CM | POA: Diagnosis present

## 2017-09-04 DIAGNOSIS — O98512 Other viral diseases complicating pregnancy, second trimester: Secondary | ICD-10-CM | POA: Insufficient documentation

## 2017-09-04 DIAGNOSIS — O09892 Supervision of other high risk pregnancies, second trimester: Secondary | ICD-10-CM | POA: Diagnosis not present

## 2017-09-04 DIAGNOSIS — Z79899 Other long term (current) drug therapy: Secondary | ICD-10-CM | POA: Insufficient documentation

## 2017-09-04 DIAGNOSIS — Z3480 Encounter for supervision of other normal pregnancy, unspecified trimester: Secondary | ICD-10-CM

## 2017-09-04 DIAGNOSIS — Z3A20 20 weeks gestation of pregnancy: Secondary | ICD-10-CM | POA: Insufficient documentation

## 2017-09-04 LAB — COMPREHENSIVE METABOLIC PANEL
ALBUMIN: 3.6 g/dL (ref 3.5–5.0)
ALT: 13 U/L — ABNORMAL LOW (ref 14–54)
ANION GAP: 9 (ref 5–15)
AST: 48 U/L — ABNORMAL HIGH (ref 15–41)
Alkaline Phosphatase: 58 U/L (ref 38–126)
BUN: 7 mg/dL (ref 6–20)
CHLORIDE: 103 mmol/L (ref 101–111)
CO2: 24 mmol/L (ref 22–32)
Calcium: 8.1 mg/dL — ABNORMAL LOW (ref 8.9–10.3)
Creatinine, Ser: 0.39 mg/dL — ABNORMAL LOW (ref 0.44–1.00)
GFR calc Af Amer: >60 mL/min (ref >60)
GFR calc non Af Amer: >60 mL/min (ref >60)
Glucose, Bld: 87 mg/dL (ref 65–99)
POTASSIUM: 3.5 mmol/L (ref 3.5–5.1)
SODIUM: 136 mmol/L (ref 135–145)
Total Bilirubin: 0.8 mg/dL (ref 0.3–1.2)
Total Protein: 7 g/dL (ref 6.5–8.1)

## 2017-09-04 LAB — CBC
HCT: 34.6 % — ABNORMAL LOW (ref 36.0–46.0)
Hemoglobin: 11.8 g/dL — ABNORMAL LOW (ref 12.0–15.0)
MCH: 28.7 pg (ref 26.0–34.0)
MCHC: 34.1 g/dL (ref 30.0–36.0)
MCV: 84.2 fL (ref 78.0–100.0)
Platelets: 273 10*3/uL (ref 150–400)
RBC: 4.11 MIL/uL (ref 3.87–5.11)
RDW: 13.7 % (ref 11.5–15.5)
WBC: 9.5 10*3/uL (ref 4.0–10.5)

## 2017-09-04 MED ORDER — ONDANSETRON 4 MG PO TBDP: 4.0000 mg | ORAL_TABLET | Freq: Four times a day (QID) | ORAL | 0 refills | Status: DC | PRN

## 2017-09-04 MED ORDER — ONDANSETRON HCL 4 MG/2ML IJ SOLN
4.0000 mg | Freq: Once | INTRAMUSCULAR | Status: AC
  Administered 2017-09-04: 4 mg via INTRAVENOUS
  Filled 2017-09-04: qty 2

## 2017-09-04 MED ORDER — COMFORT FIT MATERNITY SUPP MED MISC: 1.0000 | Freq: Every day | 0 refills | Status: DC

## 2017-09-04 MED ORDER — LACTATED RINGERS IV BOLUS (SEPSIS): 1000.0000 mL | Freq: Once | INTRAVENOUS | Status: AC

## 2017-09-04 MED ORDER — LACTATED RINGERS IV BOLUS (SEPSIS)
1000.0000 mL | Freq: Once | INTRAVENOUS | Status: AC
  Administered 2017-09-04: 1000 mL via INTRAVENOUS

## 2017-09-04 MED ORDER — DEXTROSE 5 % IN LACTATED RINGERS IV BOLUS
1000.0000 mL | Freq: Once | INTRAVENOUS | Status: AC
  Administered 2017-09-04: 1000 mL via INTRAVENOUS

## 2017-09-04 NOTE — MAU Note (Signed)
Pt is a G2P1 at 20.6 weeks c/o N&V for 24 hours.  Sister and children have confirmed case of Norovirus.  No other OB or medical concerns.

## 2017-09-04 NOTE — Discharge Instructions (Signed)
For diarrhea, you may take imodium OTC in addition to Zofran as needed.  °

## 2017-09-04 NOTE — MAU Provider Note (Signed)
Chief Complaint: Emesis During Pregnancy   First Provider Initiated Contact with Patient 09/04/17 1308      SUBJECTIVE HPI: Alexandra Meadows is a 22 y.o. G2P1001 at 3394w6d by LMP who presents to maternity admissions reporting n/v/d x 2 days with known exposure to Norovirus last week.  Her symptoms are associated with chills/body aches. She has tried Tylenol and drinking fluids but no other treatments.  There are no other associated symptoms.     HPI  Past Medical History:  Diagnosis Date  . Asthma    prn inhaler  . 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 Migraines   . Seasonal allergies   . Tonsillar hypertrophy 04/2016   Past Surgical History:  Procedure Laterality Date  . KNEE ARTHROSCOPY Left 02/28/2014   Procedure: ARTHROSCOPY LEFT KNEE WITH SYNOVECTOMY LIMITED, PARTIAL LATERAL MENISECTOMY;  Surgeon: Loreta Aveaniel F Murphy, MD;  Location: [facility];  Service: Orthopedics;  Laterality: Left;  . TONSILLECTOMY AND ADENOIDECTOMY Bilateral 05/25/2016   Procedure: TONSILLECTOMY AND ADENOIDECTOMY;  Surgeon: Newman PiesSu Teoh, MD;  Location: [facility];  Service: ENT;  Laterality: Bilateral;  . WISDOM TOOTH EXTRACTION  2015   Social History   Socioeconomic History  . Marital status: Single    Spouse name: Not on file  . Number of children: Not on file  . Years of education: Not on file  . Highest education level: Not on file  Social Needs  . Financial resource strain: Not on file  . Food insecurity - worry: Not on file  . Food insecurity - inability: Not on file  . Transportation needs - medical: Not on file  . Transportation needs - non-medical: Not on file  Occupational History  . Not on file  Tobacco Use  . Smoking status: Never Smoker  . Smokeless tobacco: Never Used  . Tobacco comment: quit  Substance and Sexual Activity  . Alcohol use: No    Alcohol/week: 0.0 oz  . Drug use: No  . Sexual activity: Yes    Partners:  Male  Other Topics Concern  . 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   Lives with parents in a 2 story home.  Currently not working.  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  Education: college.   No current facility-administered medications on file prior to encounter.    Current Outpatient Medications on File Prior to Encounter  Medication Sig Dispense Refill  . acetaminophen (TYLENOL) 500 MG tablet Take 500-1,000 mg by mouth every 6 (six) hours as needed for mild pain, moderate pain or headache.     . cyclobenzaprine (FLEXERIL) 10 MG tablet Take 1 tablet (10 mg total) by mouth every 8 (eight) hours as needed for muscle spasms. 30 tablet 2  . diphenhydrAMINE (BENADRYL) 25 MG tablet Take 25 mg by mouth daily as needed for allergies or sleep.    Marland Kitchen. Doxylamine-Pyridoxine (DICLEGIS) 10-10 MG TBEC Take 1 tablet with breakfast and lunch.  Take 2 tablets at bedtime. 100 tablet 4  . sertraline (ZOLOFT) 100 MG tablet Take 1 tablet (100 mg total) by mouth daily. 30 tablet 12  . albuterol (PROVENTIL HFA;VENTOLIN HFA) 108 (90 BASE) MCG/ACT inhaler Inhale 2 puffs into the lungs every 4 (four) hours as needed for wheezing or  shortness of breath. 1 Inhaler 11   Allergies  Allergen Reactions  . Bee Venom Hives and Swelling  . Tetanus Toxoids Hives  . Xanax [Alprazolam] Other (See Comments)    SEIZURES    ROS:  Review of Systems  Constitutional: Negative for chills, fatigue and fever.  Respiratory: Negative for shortness of breath.   Cardiovascular: Negative for chest pain.  Gastrointestinal: Positive for diarrhea, nausea and vomiting.  Genitourinary: Negative for difficulty urinating, dysuria, flank pain, pelvic pain, vaginal bleeding, vaginal discharge and vaginal pain.  Neurological: Negative for dizziness and headaches.   Psychiatric/Behavioral: Negative.      I have reviewed patient's Past Medical Hx, Surgical Hx, Family Hx, Social Hx, medications and allergies.   Physical Exam   Patient Vitals for the past 24 hrs:  Temp  09/04/17 1101 98.1 F (36.7 C)   Constitutional: Well-developed, well-nourished female in no acute distress.  Cardiovascular: normal rate Respiratory: normal effort GI: Abd soft, non-tender. Pos BS x 4 MS: Extremities nontender, no edema, normal ROM Neurologic: Alert and oriented x 4.  GU: Neg CVAT.  PELVIC EXAM: Deferred  FHT 140 by doppler  LAB RESULTS Results for orders placed or performed during the hospital encounter of 09/04/17 (from the past 24 hour(s))  CBC     Status: Abnormal   Collection Time: 09/04/17 10:46 AM  Result Value Ref Range   WBC 9.5 4.0 - 10.5 K/uL   RBC 4.11 3.87 - 5.11 MIL/uL   Hemoglobin 11.8 (L) 12.0 - 15.0 g/dL   HCT 16.1 (L) 09.6 - 04.5 %   MCV 84.2 78.0 - 100.0 fL   MCH 28.7 26.0 - 34.0 pg   MCHC 34.1 30.0 - 36.0 g/dL   RDW 40.9 81.1 - 91.4 %   Platelets 273 150 - 400 K/uL  Comprehensive metabolic panel     Status: Abnormal   Collection Time: 09/04/17 10:46 AM  Result Value Ref Range   Sodium 136 135 - 145 mmol/L   Potassium 3.5 3.5 - 5.1 mmol/L   Chloride 103 101 - 111 mmol/L   CO2 24 22 - 32 mmol/L   Glucose, Bld 87 65 - 99 mg/dL   BUN 7 6 - 20 mg/dL   Creatinine, Ser 7.82 (L) 0.44 - 1.00 mg/dL   Calcium 8.1 (L) 8.9 - 10.3 mg/dL   Total Protein 7.0 6.5 - 8.1 g/dL   Albumin 3.6 3.5 - 5.0 g/dL   AST 48 (H) 15 - 41 U/L   ALT 13 (L) 14 - 54 U/L   Alkaline Phosphatase 58 38 - 126 U/L   Total Bilirubin 0.8 0.3 - 1.2 mg/dL   GFR calc non Af Amer >60 >60 mL/min   GFR calc Af Amer >60 >60 mL/min   Anion gap 9 5 - 15    O/Positive/-- (12/06 1627)  IMAGING   MAU Management/MDM: CBC, CMP, UA No elevated WBCs, no electrolyte deficits. UA given at discharge so results pending. Likely viral gastroenteritis, especially given  known exposure to Norovirus with her child.  reatments in MAU included D5LR x 1000 ml, LR x 1000 ml, and Zofran 4 mg IV. Rx for Zofran 4 mg ODT.  May take imodium OTC PRN.  F/U in office as scheduled.  Pt discharged with strict infection precautions.  ASSESSMENT 1. Viral gastroenteritis   2. Supervision of other normal pregnancy, antepartum   3. Nausea/vomiting in pregnancy     PLAN Discharge home Allergies as of 09/04/2017  Reactions   Bee Venom Hives, Swelling   Tetanus Toxoids Hives   Xanax [alprazolam] Other (See Comments)   SEIZURES      Medication List    TAKE these medications   acetaminophen 500 MG tablet Commonly known as:  TYLENOL Take 500-1,000 mg by mouth every 6 (six) hours as needed for mild pain, moderate pain or headache.   albuterol 108 (90 Base) MCG/ACT inhaler Commonly known as:  PROVENTIL HFA;VENTOLIN HFA Inhale 2 puffs into the lungs every 4 (four) hours as needed for wheezing or shortness of breath.   cyclobenzaprine 10 MG tablet Commonly known as:  FLEXERIL Take 1 tablet (10 mg total) by mouth every 8 (eight) hours as needed for muscle spasms.   diphenhydrAMINE 25 MG tablet Commonly known as:  BENADRYL Take 25 mg by mouth daily as needed for allergies or sleep.   Doxylamine-Pyridoxine 10-10 MG Tbec Commonly known as:  DICLEGIS Take 1 tablet with breakfast and lunch.  Take 2 tablets at bedtime.   ondansetron 4 MG disintegrating tablet Commonly known as:  ZOFRAN ODT Take 1 tablet (4 mg total) by mouth every 6 (six) hours as needed for nausea.   sertraline 100 MG tablet Commonly known as:  ZOLOFT Take 1 tablet (100 mg total) by mouth daily.      Follow-up Information    [facility] Follow up.   Why:  As scheduled, return to MAU as needed for emergencies Contact information: [address] [place] [address] [phone]          Sharen Counter Certified Nurse-Midwife 09/04/2017   3:02 PM

## 2017-09-06 ENCOUNTER — Institutional Professional Consult (permissible substitution): Payer: Self-pay

## 2017-09-06 NOTE — BH Specialist Note (Deleted)
Integrated Behavioral Health Initial Visit  MRN: [id number] Name: Neta MendsChristina Fanelli  Number of Integrated Behavioral Health Clinician visits:: 1/6 Session Start time: ***  Session End time: *** Total time: {IBH Total Time:21014050}  Type of Service: Integrated Behavioral Health- Individual/Family Interpretor:No. Interpretor Name and Language: n/a   Warm Hand Off Completed.       SUBJECTIVE: Neta MendsChristina Roppolo is a 22 y.o. female accompanied by {CHL AMB ACCOMPANIED XL:[id number]:[phone]} Patient was referred by Orvilla Cornwallachelle Denney, CNM for depression. Patient reports the following symptoms/concerns: *** Duration of problem: ***; Severity of problem: {Mild/Moderate/Severe:20260}  OBJECTIVE: Mood: {BHH MOOD:22306} and Affect: {BHH AFFECT:22307} Risk of harm to self or others: {CHL AMB BH Suicide Current Mental Status:21022748}  LIFE CONTEXT: Family and Social: *** School/Work: *** Self-Care: *** Life Changes: ***  GOALS ADDRESSED: Patient will: 1. Reduce symptoms of: {IBH Symptoms:21014056} 2. Increase knowledge and/or ability of: {IBH Patient Tools:21014057}  3. Demonstrate ability to: {IBH Goals:21014053}  INTERVENTIONS: Interventions utilized: {IBH Interventions:21014054}  Standardized Assessments completed: {IBH Screening Tools:21014051}  ASSESSMENT: Patient currently experiencing ***.   Patient may benefit from ***.  PLAN: 1. Follow up with behavioral health clinician on : *** 2. Behavioral recommendations: *** 3. Referral(s): {IBH Referrals:21014055} 4. "From scale of 1-10, how likely are you to follow plan?": ***  Rae LipsJamie C McMannes, LCSW  Edinburgh Postnatal Depression Scale - 08/31/17 1604      Edinburgh Postnatal Depression Scale:  In the Past 7 Days   I have been able to laugh and see the funny side of things.  2    I have looked forward with enjoyment to things.  3    I have blamed myself unnecessarily when things went wrong.  2    I have been anxious or  worried for no good reason.  2    I have felt scared or panicky for no good reason.  0    Things have been getting on top of me.  3    I have been so unhappy that I have had difficulty sleeping.  2    I have felt sad or miserable.  2    I have been so unhappy that I have been crying.  1    The thought of harming myself has occurred to me.  0    Edinburgh Postnatal Depression Scale Total  17

## 2017-09-28 ENCOUNTER — Encounter: Payer: Self-pay | Admitting: Certified Nurse Midwife

## 2017-09-28 ENCOUNTER — Ambulatory Visit (INDEPENDENT_AMBULATORY_CARE_PROVIDER_SITE_OTHER): Payer: Medicaid Other | Admitting: Certified Nurse Midwife

## 2017-09-28 VITALS — BP 121/75 | HR 86 | Wt 154.6 lb

## 2017-09-28 DIAGNOSIS — F329 Major depressive disorder, single episode, unspecified: Secondary | ICD-10-CM

## 2017-09-28 DIAGNOSIS — Z3480 Encounter for supervision of other normal pregnancy, unspecified trimester: Secondary | ICD-10-CM

## 2017-09-28 DIAGNOSIS — Z789 Other specified health status: Secondary | ICD-10-CM

## 2017-09-28 DIAGNOSIS — O99342 Other mental disorders complicating pregnancy, second trimester: Secondary | ICD-10-CM

## 2017-09-28 DIAGNOSIS — E559 Vitamin D deficiency, unspecified: Secondary | ICD-10-CM

## 2017-09-28 DIAGNOSIS — Z3482 Encounter for supervision of other normal pregnancy, second trimester: Secondary | ICD-10-CM

## 2017-09-28 DIAGNOSIS — F32A Depression, unspecified: Secondary | ICD-10-CM

## 2017-09-28 MED ORDER — PROVIDA DHA 16-16-1.25-110 MG PO CAPS: 1.0000 | ORAL_CAPSULE | Freq: Every day | ORAL | 12 refills | Status: DC

## 2017-09-28 NOTE — Patient Instructions (Addendum)
Second Trimester of Pregnancy The second trimester is from week 14 through week 27 (months 4 through 6). The second trimester is often a time when you feel your best. Your body has adjusted to being pregnant, and you begin to feel better physically. Usually, morning sickness has lessened or quit completely, you may have more energy, and you may have an increase in appetite. The second trimester is also a time when the fetus is growing rapidly. At the end of the sixth month, the fetus is about 9 inches long and weighs about 1 pounds. You will likely begin to feel the baby move (quickening) between 16 and 20 weeks of pregnancy. Body changes during your second trimester Your body continues to go through many changes during your second trimester. The changes vary from woman to woman.  Your weight will continue to increase. You will notice your lower abdomen bulging out.  You may begin to get stretch marks on your hips, abdomen, and breasts.  You may develop headaches that can be relieved by medicines. The medicines should be approved by your health care provider.  You may urinate more often because the fetus is pressing on your bladder.  You may develop or continue to have heartburn as a result of your pregnancy.  You may develop constipation because certain hormones are causing the muscles that push waste through your intestines to slow down.  You may develop hemorrhoids or swollen, bulging veins (varicose veins).  You may have back pain. This is caused by: ? Weight gain. ? Pregnancy hormones that are relaxing the joints in your pelvis. ? A shift in weight and the muscles that support your balance.  Your breasts will continue to grow and they will continue to become tender.  Your gums may bleed and may be sensitive to brushing and flossing.  Dark spots or blotches (chloasma, mask of pregnancy) may develop on your face. This will likely fade after the baby is born.  A dark line from your  belly button to the pubic area (linea nigra) may appear. This will likely fade after the baby is born.  You may have changes in your hair. These can include thickening of your hair, rapid growth, and changes in texture. Some women also have hair loss during or after pregnancy, or hair that feels dry or thin. Your hair will most likely return to normal after your baby is born.  What to expect at prenatal visits During a routine prenatal visit:  You will be weighed to make sure you and the fetus are growing normally.  Your blood pressure will be taken.  Your abdomen will be measured to track your baby's growth.  The fetal heartbeat will be listened to.  Any test results from the previous visit will be discussed.  Your health care provider may ask you:  How you are feeling.  If you are feeling the baby move.  If you have had any abnormal symptoms, such as leaking fluid, bleeding, severe headaches, or abdominal cramping.  If you are using any tobacco products, including cigarettes, chewing tobacco, and electronic cigarettes.  If you have any questions.  Other tests that may be performed during your second trimester include:  Blood tests that check for: ? Low iron levels (anemia). ? High blood sugar that affects pregnant women (gestational diabetes) between 24 and 28 weeks. ? Rh antibodies. This is to check for a protein on red blood cells (Rh factor).  Urine tests to check for infections, diabetes, or   protein in the urine.  An ultrasound to confirm the proper growth and development of the baby.  An amniocentesis to check for possible genetic problems.  Fetal screens for spina bifida and Down syndrome.  HIV (human immunodeficiency virus) testing. Routine prenatal testing includes screening for HIV, unless you choose not to have this test.  Follow these instructions at home: Medicines  Follow your health care provider's instructions regarding medicine use. Specific  medicines may be either safe or unsafe to take during pregnancy.  Take a prenatal vitamin that contains at least 600 micrograms (mcg) of folic acid.  If you develop constipation, try taking a stool softener if your health care provider approves. Eating and drinking  Eat a balanced diet that includes fresh fruits and vegetables, whole grains, good sources of protein such as meat, eggs, or tofu, and low-fat dairy. Your health care provider will help you determine the amount of weight gain that is right for you.  Avoid raw meat and uncooked cheese. These carry germs that can cause birth defects in the baby.  If you have low calcium intake from food, talk to your health care provider about whether you should take a daily calcium supplement.  Limit foods that are high in fat and processed sugars, such as fried and sweet foods.  To prevent constipation: ? Drink enough fluid to keep your urine clear or pale yellow. ? Eat foods that are high in fiber, such as fresh fruits and vegetables, whole grains, and beans. Activity  Exercise only as directed by your health care provider. Most women can continue their usual exercise routine during pregnancy. Try to exercise for 30 minutes at least 5 days a week. Stop exercising if you experience uterine contractions.  Avoid heavy lifting, wear low heel shoes, and practice good posture.  A sexual relationship may be continued unless your health care provider directs you otherwise. Relieving pain and discomfort  Wear a good support bra to prevent discomfort from breast tenderness.  Take warm sitz baths to soothe any pain or discomfort caused by hemorrhoids. Use hemorrhoid cream if your health care provider approves.  Rest with your legs elevated if you have leg cramps or low back pain.  If you develop varicose veins, wear support hose. Elevate your feet for 15 minutes, 3-4 times a day. Limit salt in your diet. Prenatal Care  Write down your questions.  Take them to your prenatal visits.  Keep all your prenatal visits as told by your health care provider. This is important. Safety  Wear your seat belt at all times when driving.  Make a list of emergency phone numbers, including numbers for family, friends, the hospital, and police and fire departments. General instructions  Ask your health care provider for a referral to a local prenatal education class. Begin classes no later than the beginning of month 6 of your pregnancy.  Ask for help if you have counseling or nutritional needs during pregnancy. Your health care provider can offer advice or refer you to specialists for help with various needs.  Do not use hot tubs, steam rooms, or saunas.  Do not douche or use tampons or scented sanitary pads.  Do not cross your legs for long periods of time.  Avoid cat litter boxes and soil used by cats. These carry germs that can cause birth defects in the baby and possibly loss of the fetus by miscarriage or stillbirth.  Avoid all smoking, herbs, alcohol, and unprescribed drugs. Chemicals in these products can   affect the formation and growth of the baby.  Do not use any products that contain nicotine or tobacco, such as cigarettes and e-cigarettes. If you need help quitting, ask your health care provider.  Visit your dentist if you have not gone yet during your pregnancy. Use a soft toothbrush to brush your teeth and be gentle when you floss. Contact a health care provider if:  You have dizziness.  You have mild pelvic cramps, pelvic pressure, or nagging pain in the abdominal area.  You have persistent nausea, vomiting, or diarrhea.  You have a bad smelling vaginal discharge.  You have pain when you urinate. Get help right away if:  You have a fever.  You are leaking fluid from your vagina.  You have spotting or bleeding from your vagina.  You have severe abdominal cramping or pain.  You have rapid weight gain or weight  loss.  You have shortness of breath with chest pain.  You notice sudden or extreme swelling of your face, hands, ankles, feet, or legs.  You have not felt your baby move in over an hour.  You have severe headaches that do not go away when you take medicine.  You have vision changes. Summary  The second trimester is from week 14 through week 27 (months 4 through 6). It is also a time when the fetus is growing rapidly.  Your body goes through many changes during pregnancy. The changes vary from woman to woman.  Avoid all smoking, herbs, alcohol, and unprescribed drugs. These chemicals affect the formation and growth your baby.  Do not use any tobacco products, such as cigarettes, chewing tobacco, and e-cigarettes. If you need help quitting, ask your health care provider.  Contact your health care provider if you have any questions. Keep all prenatal visits as told by your health care provider. This is important. This information is not intended to replace advice given to you by your health care provider. Make sure you discuss any questions you have with your health care provider. Document Released: 07/06/2001 Document Revised: 08/17/2016 Document Reviewed: 08/17/2016 Elsevier Interactive Patient Education  2018 [employer]  Preventing Preterm Birth Preterm birth is when your baby is delivered between 20 weeks and 37 weeks of pregnancy. A full-term pregnancy lasts for at least 37 weeks. Preterm birth can be dangerous for your baby because the last few weeks of pregnancy are an important time for your baby's brain and lungs to grow. Many things can cause a baby to be born early. Sometimes the cause is not known. There are certain factors that make you more likely to experience preterm birth, such as:  Having a previous baby born preterm.  Being pregnant with twins or other multiples.  Having had fertility treatment.  Being overweight or underweight at the start of your  pregnancy.  Having any of the following during pregnancy: ? An infection, including a urinary tract infection (UTI) or an STI (sexually transmitted infection). ? High blood pressure. ? Diabetes. ? Vaginal bleeding.  Being age 22 or older.  Being age 10518 or younger.  Getting pregnant within 6 months of a previous pregnancy.  Suffering extreme stress or physical or emotional abuse during pregnancy.  Standing for long periods of time during pregnancy, such as working at a job that requires standing.  What are the risks? The most serious risk of preterm birth is that the baby may not survive. This is more likely to happen if a baby is born before 34 weeks.  Other risks and complications of preterm birth may include your baby having:  Breathing problems.  Brain damage that affects movement and coordination (cerebral palsy).  Feeding difficulties.  Vision or hearing problems.  Infections or inflammation of the digestive tract (colitis).  Developmental delays.  Learning disabilities.  Higher risk for diabetes, heart disease, and high blood pressure later in life.  What can I do to lower my risk? Medical care  The most important thing you can do to lower your risk for preterm birth is to get routine medical care during pregnancy (prenatal care). If you have a high risk of preterm birth, you may be referred to a health care provider who specializes in managing high-risk pregnancies (perinatologist). You may be given medicine to help prevent preterm birth. Lifestyle changes Certain lifestyle changes can also lower your risk of preterm birth:  Wait at least 6 months after a pregnancy to become pregnant again.  Try to plan pregnancy for when you are between 69 and 74 years old.  Get to a healthy weight before getting pregnant. If you are overweight, work with your health care provider to safely lose weight.  Do not use any products that contain nicotine or tobacco, such as  cigarettes and e-cigarettes. If you need help quitting, ask your health care provider.  Do not drink alcohol.  Do not use drugs.  Where to find support: For more support, consider:  Talking with your health care provider.  Talking with a therapist or substance abuse counselor, if you need help quitting.  Working with a diet and nutrition specialist (dietitian) or a Systems analyst to maintain a healthy weight.  Joining a support group.  Where to find more information: Learn more about preventing preterm birth from:  Centers for Disease Control and Prevention: http://curry.org/  March of Dimes: marchofdimes.org/complications/premature-babies.aspx  American Pregnancy Association: americanpregnancy.org/labor-and-birth/premature-labor  Contact a health care provider if:  You have any of the following signs of preterm labor before 37 weeks: ? A change or increase in vaginal discharge. ? Fluid leaking from your vagina. ? Pressure or cramps in your lower abdomen. ? A backache that does not go away or gets worse. ? Regular tightening (contractions) in your lower abdomen. Summary  Preterm birth means having your baby during weeks 20-37 of pregnancy.  Preterm birth may put your baby at risk for physical and mental problems.  Getting good prenatal care can help prevent preterm birth.  You can lower your risk of preterm birth by making certain lifestyle changes, such as not smoking and not using alcohol. This information is not intended to replace advice given to you by your health care provider. Make sure you discuss any questions you have with your health care provider. Document Released: 08/26/2015 Document Revised: 03/20/2016 Document Reviewed: 03/20/2016 Elsevier Interactive Patient Education  [employer].

## 2017-09-28 NOTE — Progress Notes (Signed)
   PRENATAL VISIT NOTE  Subjective:  Alexandra Meadows is a 22 y.o. G2P1001 at 3965w2d being seen today for ongoing prenatal care.  She is currently monitored for the following issues for this low-risk pregnancy and has ADHD; MIGRAINE, COMMON W/O INTRACTABLE MIGRAINE; METRORRHAGIA; INSOMNIA; Depression; Anxiety state; Intrinsic asthma; Supervision of other normal pregnancy, antepartum; and Vitamin D deficiency on their problem list.  Patient reports no complaints.  Contractions: Not present. Vag. Bleeding: None.  Movement: Present. Denies leaking of fluid.   The following portions of the patient's history were reviewed and updated as appropriate: allergies, current medications, past family history, past medical history, past social history, past surgical history and problem list. Problem list updated.  Objective:   Vitals:   09/28/17 1530  BP: 121/75  Pulse: 86  Weight: 154 lb 9.6 oz (70.1 kg)    Fetal Status: Fetal Heart Rate (bpm): 130-150; doppler Fundal Height: 22 cm Movement: Present     General:  Alert, oriented and cooperative. Patient is in no acute distress.  Skin: Skin is warm and dry. No rash noted.   Cardiovascular: Normal heart rate noted  Respiratory: Normal respiratory effort, no problems with respiration noted  Abdomen: Soft, gravid, appropriate for gestational age.  Pain/Pressure: Present     Pelvic: Cervical exam deferred        Extremities: Normal range of motion.  Edema: Trace  Mental Status:  Normal mood and affect. Normal behavior. Normal judgment and thought content.   Assessment and Plan:  Pregnancy: G2P1001 at 2865w2d  1. Supervision of other normal pregnancy, antepartum     States that she is doing better since quitting her job two weeks ago.  Has a vegetarian diet and is not eating much protein.  Increased protein and water intake encouraged.  S<D today, 31% EFW on anatomy US. Consider f/u fetal growth if measuring small next ROB.  - Prenat-FeFum-FePo-FA-DHA  w/o A (PROVIDA DHA) 16-16-1.25-110 MG CAPS; Take 1 tablet by mouth at bedtime.  Dispense: 30 capsule; Refill: 12  2. Depression during pregnancy in second trimester     Stable, taking Zoloft.   3. Vitamin D deficiency    Taking weekly vitamin D - Prenat-FeFum-FePo-FA-DHA w/o A (PROVIDA DHA) 16-16-1.25-110 MG CAPS; Take 1 tablet by mouth at bedtime.  Dispense: 30 capsule; Refill: 12  4. Consumes a vegan diet     Protein shakes encouraged.  - Prenat-FeFum-FePo-FA-DHA w/o A (PROVIDA DHA) 16-16-1.25-110 MG CAPS; Take 1 tablet by mouth at bedtime.  Dispense: 30 capsule; Refill: 12  Preterm labor symptoms and general obstetric precautions including but not limited to vaginal bleeding, contractions, leaking of fluid and fetal movement were reviewed in detail with the patient. Please refer to After Visit Summary for other counseling recommendations.  Return in about 4 weeks (around 10/26/2017) for ROB, 2 hr OGTT.   Roe Coombsachelle A Tenita Cue, CNM

## 2017-10-12 ENCOUNTER — Ambulatory Visit (INDEPENDENT_AMBULATORY_CARE_PROVIDER_SITE_OTHER): Payer: Medicaid Other | Admitting: Obstetrics

## 2017-10-12 ENCOUNTER — Encounter: Payer: Self-pay | Admitting: Obstetrics

## 2017-10-12 VITALS — BP 112/75 | HR 88 | Temp 97.9°F | Wt 154.8 lb

## 2017-10-12 DIAGNOSIS — J111 Influenza due to unidentified influenza virus with other respiratory manifestations: Secondary | ICD-10-CM

## 2017-10-12 DIAGNOSIS — Z3482 Encounter for supervision of other normal pregnancy, second trimester: Secondary | ICD-10-CM

## 2017-10-12 DIAGNOSIS — Z3480 Encounter for supervision of other normal pregnancy, unspecified trimester: Secondary | ICD-10-CM

## 2017-10-12 MED ORDER — OSELTAMIVIR PHOSPHATE 75 MG PO CAPS: 75.0000 mg | ORAL_CAPSULE | Freq: Two times a day (BID) | ORAL | 0 refills | Status: DC

## 2017-10-12 NOTE — Progress Notes (Signed)
Patient reports good fetal movement with some back pain. Pt reports feeling achy, runny nose, diarrhea that started 3 days ago.

## 2017-10-12 NOTE — Progress Notes (Signed)
Subjective:  Alexandra Meadows is a 22 y.o. G2P1001 at 1978w2d being seen today for ongoing prenatal care.  She is currently monitored for the following issues for this low-risk pregnancy and has ADHD; MIGRAINE, COMMON W/O INTRACTABLE MIGRAINE; METRORRHAGIA; INSOMNIA; Depression; Anxiety state; Intrinsic asthma; Supervision of other normal pregnancy, antepartum; and Vitamin D deficiency on their problem list.  Patient reports RUNNY NOSE AND BODY ACHES.  Contractions: Not present. Vag. Bleeding: None.  Movement: Present. Denies leaking of fluid.   The following portions of the patient's history were reviewed and updated as appropriate: allergies, current medications, past family history, past medical history, past social history, past surgical history and problem list. Problem list updated.  Objective:   Vitals:   10/12/17 0949  BP: 112/75  Pulse: 88  Temp: 97.9 F (36.6 C)  Weight: 154 lb 12.8 oz (70.2 kg)    Fetal Status: Fetal Heart Rate (bpm): 150   Movement: Present     General:  Alert, oriented and cooperative. Patient is in no acute distress.  Skin: Skin is warm and dry. No rash noted.   Cardiovascular: Normal heart rate noted  Respiratory: Normal respiratory effort, no problems with respiration noted  Abdomen: Soft, gravid, appropriate for gestational age. Pain/Pressure: Present     Pelvic:  Cervical exam deferred        Extremities: Normal range of motion.  Edema: Trace  Mental Status: Normal mood and affect. Normal behavior. Normal judgment and thought content.   Urinalysis:      Assessment and Plan:  Pregnancy: G2P1001 at 5478w2d  1. Supervision of other normal pregnancy, antepartum  2. Flu syndrome Rx: - oseltamivir (TAMIFLU) 75 MG capsule; Take 1 capsule (75 mg total) by mouth 2 (two) times daily.  Dispense: 10 capsule; Refill: 0 - Influenza a and b  Preterm labor symptoms and general obstetric precautions including but not limited to vaginal bleeding, contractions,  leaking of fluid and fetal movement were reviewed in detail with the patient. Please refer to After Visit Summary for other counseling recommendations.  Return in about 2 weeks (around 10/26/2017) for ROB.   Brock BadHarper, Jaydalyn Demattia A, MD

## 2017-10-13 ENCOUNTER — Telehealth: Payer: Self-pay

## 2017-10-13 LAB — INFLUENZA A AND B
INFLUENZA B AG, EIA: NEGATIVE
Influenza A Ag, EIA: NEGATIVE

## 2017-10-13 LAB — PLEASE NOTE:

## 2017-10-13 NOTE — Telephone Encounter (Signed)
TC from pt inquiring Flu results please review and advise pt states her daughter tested + for Flu.

## 2017-10-13 NOTE — Telephone Encounter (Signed)
Flu swab was negative.

## 2017-10-25 ENCOUNTER — Encounter: Payer: Self-pay | Admitting: Obstetrics

## 2017-10-25 ENCOUNTER — Ambulatory Visit (INDEPENDENT_AMBULATORY_CARE_PROVIDER_SITE_OTHER): Payer: Medicaid Other | Admitting: Obstetrics

## 2017-10-25 ENCOUNTER — Other Ambulatory Visit: Payer: Medicaid Other

## 2017-10-25 VITALS — BP 131/75 | HR 89 | Wt 161.0 lb

## 2017-10-25 DIAGNOSIS — Z3480 Encounter for supervision of other normal pregnancy, unspecified trimester: Secondary | ICD-10-CM

## 2017-10-25 DIAGNOSIS — J301 Allergic rhinitis due to pollen: Secondary | ICD-10-CM

## 2017-10-25 DIAGNOSIS — M545 Low back pain, unspecified: Secondary | ICD-10-CM

## 2017-10-25 DIAGNOSIS — Z3483 Encounter for supervision of other normal pregnancy, third trimester: Secondary | ICD-10-CM

## 2017-10-25 MED ORDER — COMFORT FIT MATERNITY SUPP SM MISC
0 refills | Status: DC
Start: 2017-10-25 — End: 2017-12-28

## 2017-10-25 MED ORDER — LORATADINE 10 MG PO TABS: 10.0000 mg | ORAL_TABLET | Freq: Every day | ORAL | 11 refills | Status: DC

## 2017-10-25 NOTE — Progress Notes (Signed)
Patient complains of feeling pelvic and back pain, reports unsure if it is ligament pain. Pt reports feeling fetal movement, denies bleeding.

## 2017-10-25 NOTE — Progress Notes (Signed)
Subjective:  Neta MendsChristina Edberg is a 22 y.o. G2P1001 at 472w1d being seen today for ongoing prenatal care.  She is currently monitored for the following issues for this high-risk pregnancy and has ADHD; MIGRAINE, COMMON W/O INTRACTABLE MIGRAINE; METRORRHAGIA; INSOMNIA; Depression; Anxiety state; Intrinsic asthma; Supervision of other normal pregnancy, antepartum; and Vitamin D deficiency on their problem list.  Patient reports backache.  Contractions: Irritability. Vag. Bleeding: None.  Movement: Present. Denies leaking of fluid.   The following portions of the patient's history were reviewed and updated as appropriate: allergies, current medications, past family history, past medical history, past social history, past surgical history and problem list. Problem list updated.  Objective:   Vitals:   10/25/17 0855  BP: 131/75  Pulse: 89  Weight: 161 lb (73 kg)    Fetal Status: Fetal Heart Rate (bpm): 150   Movement: Present     General:  Alert, oriented and cooperative. Patient is in no acute distress.  Skin: Skin is warm and dry. No rash noted.   Cardiovascular: Normal heart rate noted  Respiratory: Normal respiratory effort, no problems with respiration noted  Abdomen: Soft, gravid, appropriate for gestational age. Pain/Pressure: Present     Pelvic:  Cervical exam deferred        Extremities: Normal range of motion.  Edema: Trace  Mental Status: Normal mood and affect. Normal behavior. Normal judgment and thought content.   Urinalysis:      Assessment and Plan:  Pregnancy: G2P1001 at 372w1d  1. Supervision of other normal pregnancy, antepartum RX - Glucose Tolerance, 2 Hours w/1 Hour - CBC - HIV antibody (with reflex) - RPR - Protein / creatinine ratio, urine  2. Acute midline low back pain without sciatica Rx: - Elastic Bandages & Supports (COMFORT FIT MATERNITY SUPP SM) MISC; Wear as directed.  Dispense: 1 each; Refill: 0  3. Seasonal allergic rhinitis due to  pollen Rx: - loratadine (CLARITIN) 10 MG tablet; Take 1 tablet (10 mg total) by mouth daily.  Dispense: 30 tablet; Refill: 11  4. SGA (small for gestational age) Rx: - US MFM OB FOLLOW UP; Future  Preterm labor symptoms and general obstetric precautions including but not limited to vaginal bleeding, contractions, leaking of fluid and fetal movement were reviewed in detail with the patient. Please refer to After Visit Summary for other counseling recommendations.  Return in about 2 weeks (around 11/08/2017) for ROB.   Brock BadHarper, Ethie Curless A, MD

## 2017-10-26 ENCOUNTER — Other Ambulatory Visit: Payer: Self-pay | Admitting: Obstetrics

## 2017-10-26 DIAGNOSIS — O99019 Anemia complicating pregnancy, unspecified trimester: Secondary | ICD-10-CM

## 2017-10-26 LAB — GLUCOSE TOLERANCE, 2 HOURS W/ 1HR
GLUCOSE, 1 HOUR: 142 mg/dL (ref 65–179)
GLUCOSE, FASTING: 78 mg/dL (ref 65–91)
Glucose, 2 hour: 100 mg/dL (ref 65–152)

## 2017-10-26 LAB — CBC
Hematocrit: 31.8 % — ABNORMAL LOW (ref 34.0–46.6)
Hemoglobin: 10.4 g/dL — ABNORMAL LOW (ref 11.1–15.9)
MCH: 27.6 pg (ref 26.6–33.0)
MCHC: 32.7 g/dL (ref 31.5–35.7)
MCV: 84 fL (ref 79–97)
PLATELETS: 318 10*3/uL (ref 150–379)
RBC: 3.77 x10E6/uL (ref 3.77–5.28)
RDW: 13.6 % (ref 12.3–15.4)
WBC: 11.2 10*3/uL — ABNORMAL HIGH (ref 3.4–10.8)

## 2017-10-26 LAB — PROTEIN / CREATININE RATIO, URINE
Creatinine, Urine: 47.9 mg/dL
Protein, Ur: 10.9 mg/dL
Protein/Creat Ratio: 228 mg/g creat — ABNORMAL HIGH (ref 0–200)

## 2017-10-26 LAB — RPR: RPR Ser Ql: NONREACTIVE

## 2017-10-26 LAB — HIV ANTIBODY (ROUTINE TESTING W REFLEX): HIV SCREEN 4TH GENERATION: NONREACTIVE

## 2017-10-26 MED ORDER — FERROUS SULFATE 325 (65 FE) MG PO TABS: 325.0000 mg | ORAL_TABLET | Freq: Two times a day (BID) | ORAL | 5 refills | Status: DC

## 2017-11-07 ENCOUNTER — Encounter: Payer: Self-pay | Admitting: Certified Nurse Midwife

## 2017-11-09 ENCOUNTER — Ambulatory Visit (HOSPITAL_COMMUNITY)
Admission: RE | Admit: 2017-11-09 | Discharge: 2017-11-09 | Disposition: A | Discharge status: Home / Self Care | Payer: Medicaid Other | Source: Ambulatory Visit | Attending: Obstetrics | Admitting: Obstetrics

## 2017-11-09 ENCOUNTER — Other Ambulatory Visit: Payer: Self-pay | Admitting: Obstetrics

## 2017-11-09 DIAGNOSIS — O365990 Maternal care for other known or suspected poor fetal growth, unspecified trimester, not applicable or unspecified: Secondary | ICD-10-CM | POA: Diagnosis present

## 2017-11-09 DIAGNOSIS — Z3A30 30 weeks gestation of pregnancy: Secondary | ICD-10-CM | POA: Diagnosis not present

## 2017-11-09 DIAGNOSIS — O365930 Maternal care for other known or suspected poor fetal growth, third trimester, not applicable or unspecified: Secondary | ICD-10-CM | POA: Diagnosis not present

## 2017-11-10 ENCOUNTER — Encounter: Payer: Self-pay | Admitting: Certified Nurse Midwife

## 2017-11-10 ENCOUNTER — Ambulatory Visit (INDEPENDENT_AMBULATORY_CARE_PROVIDER_SITE_OTHER): Payer: Medicaid Other | Admitting: Certified Nurse Midwife

## 2017-11-10 VITALS — BP 126/78 | HR 103 | Wt 161.4 lb

## 2017-11-10 DIAGNOSIS — F329 Major depressive disorder, single episode, unspecified: Secondary | ICD-10-CM

## 2017-11-10 DIAGNOSIS — O99343 Other mental disorders complicating pregnancy, third trimester: Secondary | ICD-10-CM

## 2017-11-10 DIAGNOSIS — Z3480 Encounter for supervision of other normal pregnancy, unspecified trimester: Secondary | ICD-10-CM

## 2017-11-10 DIAGNOSIS — F32A Depression, unspecified: Secondary | ICD-10-CM

## 2017-11-10 DIAGNOSIS — O26893 Other specified pregnancy related conditions, third trimester: Secondary | ICD-10-CM

## 2017-11-10 DIAGNOSIS — R51 Headache: Secondary | ICD-10-CM

## 2017-11-10 DIAGNOSIS — E559 Vitamin D deficiency, unspecified: Secondary | ICD-10-CM

## 2017-11-10 DIAGNOSIS — R519 Headache, unspecified: Secondary | ICD-10-CM

## 2017-11-10 DIAGNOSIS — Z3483 Encounter for supervision of other normal pregnancy, third trimester: Secondary | ICD-10-CM

## 2017-11-10 MED ORDER — BUTALBITAL-APAP-CAFFEINE 50-325-40 MG PO TABS: 1.0000 | ORAL_TABLET | Freq: Four times a day (QID) | ORAL | 1 refills | Status: DC | PRN

## 2017-11-10 NOTE — Progress Notes (Signed)
Pt concerned about swelling and states last visit she had protein in her urine.  Pt states she also has HA's and increased stomach pains.

## 2017-11-10 NOTE — Progress Notes (Signed)
   PRENATAL VISIT NOTE  Subjective:  Alexandra Meadows is a 22 y.o. G2P1001 at 64104w3d being seen today for ongoing prenatal care.  She is currently monitored for the following issues for this low-risk pregnancy and has ADHD; MIGRAINE, COMMON W/O INTRACTABLE MIGRAINE; METRORRHAGIA; INSOMNIA; Depression; Anxiety state; Intrinsic asthma; Supervision of other normal pregnancy, antepartum; and Vitamin D deficiency on their problem list.  Patient reports fatigue, headache, no bleeding, no leaking, occasional contractions and ear pressure for several days, daughter has a cold/ear infection.  .  Contractions: Irregular. Vag. Bleeding: None.  Movement: Present. Denies leaking of fluid.   The following portions of the patient's history were reviewed and updated as appropriate: allergies, current medications, past family history, past medical history, past social history, past surgical history and problem list. Problem list updated.  Objective:   Vitals:   11/10/17 1539  BP: 126/78  Pulse: (!) 103  Weight: 161 lb 6.4 oz (73.2 kg)    Fetal Status: Fetal Heart Rate (bpm): 140; doppler Fundal Height: 28 cm Movement: Present     General:  Alert, oriented and cooperative. Patient is in no acute distress.  HEENT: Ears: right ear normal.  Left ear, scar tissue present, some thick yellow fluid behind ear drum, no evidence of acute otis media  Skin: Skin is warm and dry. No rash noted.   Cardiovascular: Normal heart rate noted  Respiratory: Normal respiratory effort, no problems with respiration noted  Abdomen: Soft, gravid, appropriate for gestational age.  Pain/Pressure: Present     Pelvic: Cervical exam deferred        Extremities: Normal range of motion.  Edema: Trace  Mental Status: Normal mood and affect. Normal behavior. Normal judgment and thought content.   Assessment and Plan:  Pregnancy: G2P1001 at 67104w3d  1. Supervision of other normal pregnancy, antepartum      Allergy symptoms: OTC  medications discussed  2. Vitamin D deficiency     Taking weekly vitamin D  3. Depression during pregnancy in third trimester     On Zoloft  4. Pregnancy headache in third trimester      - butalbital-acetaminophen-caffeine (FIORICET, ESGIC) 50-325-40 MG tablet; Take 1-2 tablets by mouth every 6 (six) hours as needed.  Dispense: 45 tablet; Refill: 1  Preterm labor symptoms and general obstetric precautions including but not limited to vaginal bleeding, contractions, leaking of fluid and fetal movement were reviewed in detail with the patient. Please refer to After Visit Summary for other counseling recommendations.  Return in about 2 weeks (around 11/24/2017) for ROB.  Future Appointments  Date Time Provider Department Center  11/24/2017  4:00 PM Roe Coombsenney, Larence Thone A, CNM [facility] None    Roe Coombsachelle A Larine Fielding, CNM

## 2017-11-15 ENCOUNTER — Other Ambulatory Visit: Payer: Self-pay

## 2017-11-15 ENCOUNTER — Encounter: Payer: Self-pay | Admitting: Certified Nurse Midwife

## 2017-11-15 ENCOUNTER — Ambulatory Visit (INDEPENDENT_AMBULATORY_CARE_PROVIDER_SITE_OTHER): Payer: Medicaid Other | Admitting: Certified Nurse Midwife

## 2017-11-15 VITALS — BP 121/75 | HR 103 | Temp 98.0°F | Wt 163.3 lb

## 2017-11-15 DIAGNOSIS — E559 Vitamin D deficiency, unspecified: Secondary | ICD-10-CM

## 2017-11-15 DIAGNOSIS — Z3480 Encounter for supervision of other normal pregnancy, unspecified trimester: Secondary | ICD-10-CM

## 2017-11-15 DIAGNOSIS — Z3483 Encounter for supervision of other normal pregnancy, third trimester: Secondary | ICD-10-CM

## 2017-11-15 DIAGNOSIS — J40 Bronchitis, not specified as acute or chronic: Secondary | ICD-10-CM

## 2017-11-15 DIAGNOSIS — J029 Acute pharyngitis, unspecified: Secondary | ICD-10-CM

## 2017-11-15 LAB — POCT RAPID STREP A (OFFICE): RAPID STREP A SCREEN: NEGATIVE

## 2017-11-15 MED ORDER — ALBUTEROL SULFATE HFA 108 (90 BASE) MCG/ACT IN AERS: 2.0000 | INHALATION_SPRAY | RESPIRATORY_TRACT | 11 refills | Status: DC | PRN

## 2017-11-15 MED ORDER — AMOXICILLIN-POT CLAVULANATE 875-125 MG PO TABS: 1.0000 | ORAL_TABLET | Freq: Two times a day (BID) | ORAL | 0 refills | Status: DC

## 2017-11-15 MED ORDER — HYDROCOD POLST-CPM POLST ER 10-8 MG/5ML PO SUER: 5.0000 mL | Freq: Two times a day (BID) | ORAL | 0 refills | Status: DC | PRN

## 2017-11-15 MED ORDER — BENZONATATE 100 MG PO CAPS: 100.0000 mg | ORAL_CAPSULE | Freq: Three times a day (TID) | ORAL | 0 refills | Status: DC | PRN

## 2017-11-15 NOTE — Progress Notes (Signed)
C/o of throat pain 8/10 x 6 days. Rapid Strep/FLU done.

## 2017-11-15 NOTE — Progress Notes (Signed)
   PRENATAL VISIT NOTE  Subjective:  Alexandra Meadows is a 22 y.o. G2P1001 at 5683w1d being seen today for ongoing prenatal care.  She is currently monitored for the following issues for this low-risk pregnancy and has ADHD; MIGRAINE, COMMON W/O INTRACTABLE MIGRAINE; METRORRHAGIA; INSOMNIA; Depression; Anxiety state; Intrinsic asthma; Supervision of other normal pregnancy, antepartum; and Vitamin D deficiency on their problem list.  Patient reports no bleeding, no contractions, no cramping, no leaking and sore throat, cough, congestion, nasal drainage, chills X1 week, has been exposed to Influenza and Strep throat.  Unsure of fever. Reports using Mucinex and cough medication.  Contractions: Irritability. Vag. Bleeding: None.  Movement: Present. Denies leaking of fluid.   The following portions of the patient's history were reviewed and updated as appropriate: allergies, current medications, past family history, past medical history, past social history, past surgical history and problem list. Problem list updated.  Objective:   Vitals:   11/15/17 1058  BP: 121/75  Pulse: (!) 103  Temp: 98 F (36.7 C)  Weight: 163 lb 4.8 oz (74.1 kg)    Fetal Status: Fetal Heart Rate (bpm): 158; doppler Fundal Height: 29 cm Movement: Present     General:  Alert, oriented and cooperative. Patient is in no acute distress.  Skin: Skin is warm and dry. No rash noted.   Cardiovascular: Normal heart rate noted  Respiratory: Normal respiratory effort, no problems with respiration noted, CTA bilateral anterior and posterior  Abdomen: Soft, gravid, appropriate for gestational age.  Pain/Pressure: Present     Pelvic: Cervical exam deferred        Extremities: Normal range of motion.  Edema: Trace  Mental Status: Normal mood and affect. Normal behavior. Normal judgment and thought content.   Assessment and Plan:  Pregnancy: G2P1001 at 1183w1d  1. Acute sore throat     Negative Strep - POCT rapid strep A  2.  Supervision of other normal pregnancy, antepartum        3. Vitamin D deficiency     Taking weekly vitamin D  4. Bronchitis     R/O Influenza.  Most likely acute URI.   - amoxicillin-clavulanate (AUGMENTIN) 875-125 MG tablet; Take 1 tablet by mouth 2 (two) times daily.  Dispense: 14 tablet; Refill: 0 - benzonatate (TESSALON PERLES) 100 MG capsule; Take 1 capsule (100 mg total) by mouth 3 (three) times daily as needed for cough.  Dispense: 30 capsule; Refill: 0 - chlorpheniramine-HYDROcodone (TUSSIONEX PENNKINETIC ER) 10-8 MG/5ML SUER; Take 5 mLs by mouth every 12 (twelve) hours as needed for cough.  Dispense: 140 mL; Refill: 0 - albuterol (PROVENTIL HFA;VENTOLIN HFA) 108 (90 Base) MCG/ACT inhaler; Inhale 2 puffs into the lungs every 4 (four) hours as needed for wheezing or shortness of breath.  Dispense: 1 Inhaler; Refill: 11  Preterm labor symptoms and general obstetric precautions including but not limited to vaginal bleeding, contractions, leaking of fluid and fetal movement were reviewed in detail with the patient. Please refer to After Visit Summary for other counseling recommendations.  Return in about 2 weeks (around 11/29/2017) for ROB.  Future Appointments  Date Time Provider Department Center  11/30/2017  4:15 PM Roe Coombsenney, Milburn Freeney A, CNM [facility] None    Roe Coombsachelle A Jayleen Afonso, CNM

## 2017-11-18 LAB — VIRAL CULTURE,RAPID,INFLUENZA

## 2017-11-22 ENCOUNTER — Telehealth: Payer: Self-pay

## 2017-11-22 NOTE — Telephone Encounter (Signed)
TC from pt c/o pain since last friday Pt states she is not having contractions No leaking of fluids Pt last had intercourse Sunday Has not had a BM  Pt believes she is backed up asked safe stool sofner Pt advised to increase water intake Safe OTC medications from list Pt also advised is pain and pressure continues to go to MAU. Pt voiced understanding.

## 2017-11-24 ENCOUNTER — Encounter: Payer: Self-pay | Admitting: Certified Nurse Midwife

## 2017-11-30 ENCOUNTER — Other Ambulatory Visit (HOSPITAL_COMMUNITY)
Admission: RE | Admit: 2017-11-30 | Discharge: 2017-11-30 | Disposition: A | Discharge status: Home / Self Care | Payer: Medicaid Other | Source: Ambulatory Visit | Attending: Certified Nurse Midwife | Admitting: Certified Nurse Midwife

## 2017-11-30 ENCOUNTER — Ambulatory Visit (INDEPENDENT_AMBULATORY_CARE_PROVIDER_SITE_OTHER): Payer: Medicaid Other | Admitting: Certified Nurse Midwife

## 2017-11-30 ENCOUNTER — Encounter: Payer: Self-pay | Admitting: Certified Nurse Midwife

## 2017-11-30 VITALS — BP 120/73 | HR 93 | Wt 163.5 lb

## 2017-11-30 DIAGNOSIS — N949 Unspecified condition associated with female genital organs and menstrual cycle: Secondary | ICD-10-CM | POA: Insufficient documentation

## 2017-11-30 DIAGNOSIS — R102 Pelvic and perineal pain: Secondary | ICD-10-CM

## 2017-11-30 DIAGNOSIS — O26893 Other specified pregnancy related conditions, third trimester: Secondary | ICD-10-CM | POA: Diagnosis not present

## 2017-11-30 DIAGNOSIS — Z3480 Encounter for supervision of other normal pregnancy, unspecified trimester: Secondary | ICD-10-CM

## 2017-11-30 DIAGNOSIS — E559 Vitamin D deficiency, unspecified: Secondary | ICD-10-CM

## 2017-11-30 DIAGNOSIS — N898 Other specified noninflammatory disorders of vagina: Secondary | ICD-10-CM

## 2017-11-30 DIAGNOSIS — O26843 Uterine size-date discrepancy, third trimester: Secondary | ICD-10-CM

## 2017-11-30 NOTE — Progress Notes (Signed)
ROB.  C/o constipation with the iron pills, she is still coughing.

## 2017-11-30 NOTE — Progress Notes (Signed)
   PRENATAL VISIT NOTE  Subjective:  Alexandra Meadows is a 22 y.o. G2P1001 at [redacted]w[redacted]d being seen today for ongoing prenatal care.  She is currently monitored for the following issues for this low-risk pregnancy and has ADHD; MIGRAINE, COMMON W/O INTRACTABLE MIGRAINE; METRORRHAGIA; INSOMNIA; Depression; Anxiety state; Intrinsic asthma; Supervision of other normal pregnancy, antepartum; and Vitamin D deficiency on their problem list.  Patient reports no bleeding, no leaking, occasional contractions and increased pelvic pressure, contractions through-out the day, N&V every day, not eating or sleeping well.  Taking antiemetics and antidepressant.  Contractions: Irregular. Vag. Bleeding: None.  Movement: Present. Denies leaking of fluid.   The following portions of the patient's history were reviewed and updated as appropriate: allergies, current medications, past family history, past medical history, past social history, past surgical history and problem list. Problem list updated.  Objective:   Vitals:   11/30/17 1625  BP: 120/73  Pulse: 93  Weight: 163 lb 8 oz (74.2 kg)    Fetal Status: Fetal Heart Rate (bpm): 145; doppler Fundal Height: 30 cm Movement: Present  Presentation: Vertex  General:  Alert, oriented and cooperative. Patient is in no acute distress.  Skin: Skin is warm and dry. No rash noted.   Cardiovascular: Normal heart rate noted  Respiratory: Normal respiratory effort, no problems with respiration noted  Abdomen: Soft, gravid, appropriate for gestational age.  Pain/Pressure: Present     Pelvic: Cervical exam performed Dilation: Closed Effacement (%): 50 Station: Ballotable  Extremities: Normal range of motion.  Edema: Trace  Mental Status: Normal mood and affect. Normal behavior. Normal judgment and thought content.   Assessment and Plan:  Pregnancy: G2P1001 at [redacted]w[redacted]d  1. Supervision of other normal pregnancy, antepartum      - [place] MFM OB FOLLOW UP; Future  2. Vitamin D  deficiency     Taking weekly vitamin D  3. Vaginal discharge during pregnancy in third trimester      - Cervicovaginal ancillary only  4. Pelvic pressure in pregnancy, antepartum, third trimester      - Fetal fibronectin - Cervicovaginal ancillary only  5. Uterine size date discrepancy pregnancy, third trimester     S<D - [place] [facility]FM OB FOLLOW UP; Future  Preterm labor symptoms and general obstetric precautions including but not limited to vaginal bleeding, contractions, leaking of fluid and fetal movement were reviewed in detail with the patient. Please refer to After Visit Summary for other counseling recommendations.  Return in about 2 weeks (around 12/14/2017) for ROB, GBS.  Future Appointments  Date Time Provider Department Center  12/09/2017  4:00 PM [facility] [place] 3 [facility]    Roe Coombs, CNM

## 2017-12-01 LAB — FETAL FIBRONECTIN: Fetal Fibronectin: NEGATIVE

## 2017-12-02 LAB — CERVICOVAGINAL ANCILLARY ONLY
Bacterial vaginitis: POSITIVE — AB
Candida vaginitis: POSITIVE — AB
Chlamydia: NEGATIVE
NEISSERIA GONORRHEA: NEGATIVE
Trichomonas: NEGATIVE

## 2017-12-05 ENCOUNTER — Other Ambulatory Visit: Payer: Self-pay

## 2017-12-05 ENCOUNTER — Other Ambulatory Visit: Payer: Self-pay | Admitting: Certified Nurse Midwife

## 2017-12-05 DIAGNOSIS — B379 Candidiasis, unspecified: Secondary | ICD-10-CM

## 2017-12-05 DIAGNOSIS — N760 Acute vaginitis: Principal | ICD-10-CM

## 2017-12-05 DIAGNOSIS — B9689 Other specified bacterial agents as the cause of diseases classified elsewhere: Secondary | ICD-10-CM

## 2017-12-05 MED ORDER — METRONIDAZOLE 0.75 % VA GEL: 1.0000 | Freq: Every day | VAGINAL | 1 refills | Status: DC

## 2017-12-05 MED ORDER — FLUCONAZOLE 150 MG PO TABS: 150.0000 mg | ORAL_TABLET | Freq: Once | ORAL | 0 refills | Status: AC

## 2017-12-05 MED ORDER — SECNIDAZOLE 2 G PO PACK: 1.0000 | PACK | Freq: Once | ORAL | 0 refills | Status: AC

## 2017-12-06 ENCOUNTER — Encounter (HOSPITAL_COMMUNITY): Payer: Self-pay

## 2017-12-06 ENCOUNTER — Inpatient Hospital Stay (HOSPITAL_COMMUNITY)
Admission: AD | Admit: 2017-12-06 | Discharge: 2017-12-06 | Disposition: A | Discharge status: Home / Self Care | Payer: Medicaid Other | Source: Ambulatory Visit | Attending: Obstetrics and Gynecology | Admitting: Obstetrics and Gynecology

## 2017-12-06 DIAGNOSIS — Z3689 Encounter for other specified antenatal screening: Secondary | ICD-10-CM

## 2017-12-06 DIAGNOSIS — R109 Unspecified abdominal pain: Secondary | ICD-10-CM | POA: Diagnosis present

## 2017-12-06 DIAGNOSIS — O9989 Other specified diseases and conditions complicating pregnancy, childbirth and the puerperium: Secondary | ICD-10-CM

## 2017-12-06 DIAGNOSIS — Z3493 Encounter for supervision of normal pregnancy, unspecified, third trimester: Secondary | ICD-10-CM | POA: Diagnosis not present

## 2017-12-06 DIAGNOSIS — Z3A34 34 weeks gestation of pregnancy: Secondary | ICD-10-CM | POA: Diagnosis not present

## 2017-12-06 DIAGNOSIS — Z79899 Other long term (current) drug therapy: Secondary | ICD-10-CM | POA: Diagnosis not present

## 2017-12-06 DIAGNOSIS — O26893 Other specified pregnancy related conditions, third trimester: Secondary | ICD-10-CM | POA: Diagnosis present

## 2017-12-06 DIAGNOSIS — Z3480 Encounter for supervision of other normal pregnancy, unspecified trimester: Secondary | ICD-10-CM

## 2017-12-06 LAB — URINALYSIS, ROUTINE W REFLEX MICROSCOPIC
Bilirubin Urine: NEGATIVE
GLUCOSE, UA: NEGATIVE mg/dL
Hgb urine dipstick: NEGATIVE
Ketones, ur: NEGATIVE mg/dL
LEUKOCYTES UA: NEGATIVE
NITRITE: NEGATIVE
PH: 6 (ref 5.0–8.0)
Protein, ur: NEGATIVE mg/dL
SPECIFIC GRAVITY, URINE: 1.019 (ref 1.005–1.030)

## 2017-12-06 NOTE — MAU Note (Signed)
Pt reports she was told last week that she had BV, and has been contracting. States they have been getting closer and stronger.

## 2017-12-06 NOTE — MAU Provider Note (Signed)
Patient Alexandra Meadows is 22 y.o. G2P1001 at [redacted]w[redacted]d here with complaints of contractions after intercourse last night.  She denies bleeding, decreased fetal movements, increased vaginal discharge, dysuria, N/V. She was seen last week at [facility] and diagnosed with yeast and BV; started treatment yesterday. Her tests for GC Chlamydia was negative.   History     CSN: [id number]  Arrival date and time: 12/06/17 1315   First Provider Initiated Contact with Patient 12/06/17 1411      Chief Complaint  Patient presents with  . Contractions   Abdominal Pain  This is a new problem. The current episode started 1 to 4 weeks ago. The onset quality is sudden. The problem occurs intermittently. The problem has been resolved. The pain is located in the suprapubic region. The pain is at a severity of 3/10. The pain is mild. The quality of the pain is cramping. The abdominal pain does not radiate. Pertinent negatives include no constipation, diarrhea, dysuria, headaches, nausea or vomiting. Nothing aggravates the pain. The pain is relieved by activity.   Patient reports that she had contractions off and on last week; she had intercourse last night at 9 pm and they became more regular for a few hours after that. She came in this afternoon after she called her doctors office. She does not think she is in labor; she just wanted to make sure everything is ok.  OB History    Gravida  2   Para  1   Term  1   Preterm      AB      Living  1     SAB      TAB      Ectopic      Multiple  0   Live Births  1           Past Medical History:  Diagnosis Date  . Asthma    prn inhaler  . 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 Migraines   . Seasonal allergies   . Tonsillar hypertrophy 04/2016    Past Surgical History:  Procedure Laterality Date  . KNEE ARTHROSCOPY Left 02/28/2014   Procedure: ARTHROSCOPY LEFT KNEE WITH SYNOVECTOMY LIMITED, PARTIAL LATERAL  MENISECTOMY;  Surgeon: Loreta Ave, MD;  Location: [facility];  Service: Orthopedics;  Laterality: Left;  . TONSILLECTOMY AND ADENOIDECTOMY Bilateral 05/25/2016   Procedure: TONSILLECTOMY AND ADENOIDECTOMY;  Surgeon: Newman Pies, MD;  Location: [facility];  Service: ENT;  Laterality: Bilateral;  . WISDOM TOOTH EXTRACTION  2015    History reviewed. No pertinent family history.  Social History   Tobacco Use  . Smoking status: Never Smoker  . Smokeless tobacco: Never Used  . Tobacco comment: quit  Substance Use Topics  . Alcohol use: No    Alcohol/week: 0.0 oz  . Drug use: No    Allergies:  Allergies  Allergen Reactions  . Bee Venom Hives and Swelling  . Tetanus Toxoids Hives  . Xanax [Alprazolam] Other (See Comments)    SEIZURES    Medications Prior to Admission  Medication Sig Dispense Refill Last Dose  . acetaminophen (TYLENOL) 500 MG tablet Take 500-1,000 mg by mouth every 6 (six) hours as needed for mild pain, moderate pain or headache.    Taking  . albuterol (PROVENTIL HFA;VENTOLIN HFA) 108 (90 Base) MCG/ACT inhaler Inhale 2 puffs into the lungs every 4 (four) hours as needed for wheezing or  shortness of breath. 1 Inhaler 11   . amoxicillin-clavulanate (AUGMENTIN) 875-125 MG tablet Take 1 tablet by mouth 2 (two) times daily. 14 tablet 0   . benzonatate (TESSALON PERLES) 100 MG capsule Take 1 capsule (100 mg total) by mouth 3 (three) times daily as needed for cough. 30 capsule 0   . butalbital-acetaminophen-caffeine (FIORICET, ESGIC) 50-325-40 MG tablet Take 1-2 tablets by mouth every 6 (six) hours as needed. 45 tablet 1   . chlorpheniramine-HYDROcodone (TUSSIONEX PENNKINETIC ER) 10-8 MG/5ML SUER Take 5 mLs by mouth every 12 (twelve) hours as needed for cough. 140 mL 0   . cyclobenzaprine (FLEXERIL) 10 MG tablet Take 1 tablet (10 mg total) by mouth every 8 (eight) hours as needed for muscle spasms. 30 tablet 2 Taking  . diphenhydrAMINE (BENADRYL)  25 MG tablet Take 25 mg by mouth daily as needed for allergies or sleep.   Taking  . Doxylamine-Pyridoxine (DICLEGIS) 10-10 MG TBEC Take 1 tablet with breakfast and lunch.  Take 2 tablets at bedtime. 100 tablet 4 Taking  . Elastic Bandages & Supports (COMFORT FIT MATERNITY SUPP MED) MISC 1 Device by Does not apply route daily. 1 each 0 Taking  . Elastic Bandages & Supports (COMFORT FIT MATERNITY SUPP SM) MISC Wear as directed. 1 each 0 Taking  . ferrous sulfate 325 (65 FE) MG tablet Take 1 tablet (325 mg total) by mouth 2 (two) times daily with a meal. (Patient not taking: Reported on 11/30/2017) 60 tablet 5 Not Taking  . loratadine (CLARITIN) 10 MG tablet Take 1 tablet (10 mg total) by mouth daily. 30 tablet 11 Taking  . metroNIDAZOLE (METROGEL) 0.75 % vaginal gel Place 1 Applicatorful vaginally at bedtime. Apply one applicatorful to vagina at bedtime for 5 days 70 g 1   . Prenat-FeFum-FePo-FA-DHA w/o A (PROVIDA DHA) 16-16-1.25-110 MG CAPS Take 1 tablet by mouth at bedtime. 30 capsule 12 Taking  . sertraline (ZOLOFT) 100 MG tablet Take 1 tablet (100 mg total) by mouth daily. 30 tablet 12 Taking    Review of Systems  Constitutional: Negative.   HENT: Negative.   Respiratory: Negative.   Gastrointestinal: Positive for abdominal pain. Negative for constipation, diarrhea, nausea and vomiting.  Genitourinary: Negative for dysuria, urgency, vaginal bleeding, vaginal discharge and vaginal pain.  Neurological: Negative for headaches.  Psychiatric/Behavioral: Negative.    Physical Exam   Blood pressure 135/70, pulse 96, temperature 98.5 F (36.9 C), temperature source Oral, resp. rate 16, height  (1.626 m), weight 164 lb (74.4 kg), last menstrual period 04/11/2017, SpO2 98 %, currently breastfeeding.  Physical Exam  Constitutional: She is oriented to person, place, and time. She appears well-developed.  HENT:  Head: Normocephalic.  Neck: Normal range of motion.  GI: Soft.  Genitourinary:  Vagina normal.  Genitourinary Comments: Normal external female genitalia; Cervis is FT, soft, posterior. Ballotable.   Neurological: She is alert and oriented to person, place, and time.  Skin: Skin is warm and dry.  Psychiatric: She has a normal mood and affect.    MAU Course  Procedures  MDM -NST: 125 bpm, mod var, present acel, neg decel, no contractions.   Patient has had no contractions while in MAU. GC and chlamydia not done, as she was tested on 5-8. She is also being treated for BV and yeast.   Assessment and Plan   1. NST (non-stress test) reactive   2. Supervision of other normal pregnancy, antepartum    2. Patient stable for discharge with return precautions. Reassured  patient of normalcy of contractions after intercourse.  3. Return to MAU if her contractions become stronger or more frequent, or if she has any bleeding, LOF or decreased fetal movements.  4. Patient verbalized understanding; ready for discharge.   Charlesetta Garibaldi Kooistra 12/06/2017, 2:21 PM

## 2017-12-06 NOTE — Discharge Instructions (Signed)

## 2017-12-09 ENCOUNTER — Other Ambulatory Visit: Payer: Self-pay | Admitting: Certified Nurse Midwife

## 2017-12-09 ENCOUNTER — Ambulatory Visit (HOSPITAL_COMMUNITY)
Admission: RE | Admit: 2017-12-09 | Discharge: 2017-12-09 | Disposition: A | Discharge status: Home / Self Care | Payer: Medicaid Other | Source: Ambulatory Visit | Attending: Certified Nurse Midwife | Admitting: Certified Nurse Midwife

## 2017-12-09 DIAGNOSIS — O358XX0 Maternal care for other (suspected) fetal abnormality and damage, not applicable or unspecified: Secondary | ICD-10-CM | POA: Insufficient documentation

## 2017-12-09 DIAGNOSIS — Z362 Encounter for other antenatal screening follow-up: Secondary | ICD-10-CM | POA: Diagnosis not present

## 2017-12-09 DIAGNOSIS — O26893 Other specified pregnancy related conditions, third trimester: Secondary | ICD-10-CM | POA: Insufficient documentation

## 2017-12-09 DIAGNOSIS — O26843 Uterine size-date discrepancy, third trimester: Secondary | ICD-10-CM

## 2017-12-09 DIAGNOSIS — I868 Varicose veins of other specified sites: Secondary | ICD-10-CM | POA: Insufficient documentation

## 2017-12-09 DIAGNOSIS — Z3480 Encounter for supervision of other normal pregnancy, unspecified trimester: Secondary | ICD-10-CM

## 2017-12-09 DIAGNOSIS — Z3A34 34 weeks gestation of pregnancy: Secondary | ICD-10-CM

## 2017-12-12 ENCOUNTER — Encounter (HOSPITAL_COMMUNITY): Payer: Self-pay | Admitting: *Deleted

## 2017-12-12 ENCOUNTER — Telehealth (HOSPITAL_COMMUNITY): Payer: Self-pay | Admitting: *Deleted

## 2017-12-12 ENCOUNTER — Other Ambulatory Visit (HOSPITAL_COMMUNITY)
Admission: RE | Admit: 2017-12-12 | Discharge: 2017-12-12 | Disposition: A | Discharge status: Home / Self Care | Payer: Medicaid Other | Source: Ambulatory Visit | Attending: Obstetrics & Gynecology | Admitting: Obstetrics & Gynecology

## 2017-12-12 ENCOUNTER — Ambulatory Visit (INDEPENDENT_AMBULATORY_CARE_PROVIDER_SITE_OTHER): Payer: Medicaid Other | Admitting: Obstetrics & Gynecology

## 2017-12-12 ENCOUNTER — Other Ambulatory Visit: Payer: Self-pay | Admitting: Certified Nurse Midwife

## 2017-12-12 ENCOUNTER — Other Ambulatory Visit (HOSPITAL_COMMUNITY): Payer: Self-pay | Admitting: *Deleted

## 2017-12-12 VITALS — BP 119/76 | HR 103 | Wt 165.8 lb

## 2017-12-12 DIAGNOSIS — O358XX0 Maternal care for other (suspected) fetal abnormality and damage, not applicable or unspecified: Secondary | ICD-10-CM

## 2017-12-12 DIAGNOSIS — Z3A35 35 weeks gestation of pregnancy: Secondary | ICD-10-CM | POA: Diagnosis not present

## 2017-12-12 DIAGNOSIS — Z79899 Other long term (current) drug therapy: Secondary | ICD-10-CM | POA: Insufficient documentation

## 2017-12-12 DIAGNOSIS — O99343 Other mental disorders complicating pregnancy, third trimester: Secondary | ICD-10-CM

## 2017-12-12 DIAGNOSIS — O0993 Supervision of high risk pregnancy, unspecified, third trimester: Secondary | ICD-10-CM | POA: Diagnosis not present

## 2017-12-12 DIAGNOSIS — IMO0001 Reserved for inherently not codable concepts without codable children: Secondary | ICD-10-CM

## 2017-12-12 DIAGNOSIS — F329 Major depressive disorder, single episode, unspecified: Secondary | ICD-10-CM | POA: Diagnosis not present

## 2017-12-12 LAB — OB RESULTS CONSOLE GC/CHLAMYDIA: GC PROBE AMP, GENITAL: NEGATIVE

## 2017-12-12 NOTE — Telephone Encounter (Signed)
Preadmission screen  

## 2017-12-12 NOTE — Patient Instructions (Addendum)
Return to clinic for any scheduled appointments or obstetric concerns, or go to MAU for evaluation   Labor Induction Labor induction is when steps are taken to cause a pregnant woman to begin the labor process. Most women go into labor on their own between 37 weeks and 42 weeks of the pregnancy. When this does not happen or when there is a medical need, methods may be used to induce labor. Labor induction causes a pregnant woman's uterus to contract. It also causes the cervix to soften (ripen), open (dilate), and thin out (efface). Usually, labor is not induced before 39 weeks of the pregnancy unless there is a problem with the baby or mother. Before inducing labor, your health care provider will consider a number of factors, including the following:  The medical condition of you and the baby.  How many weeks along you are.  The status of the baby's lung maturity.  The condition of the cervix.  The position of the baby.  What are the reasons for labor induction? Labor may be induced for the following reasons:  The health of the baby or mother is at risk.  The pregnancy is overdue by 1 week or more.  The water breaks but labor does not start on its own.  The mother has a health condition or serious illness, such as high blood pressure, infection, placental abruption, or diabetes.  The amniotic fluid amounts are low around the baby.  The baby is distressed.  Convenience or wanting the baby to be born on a certain date is not a reason for inducing labor. What methods are used for labor induction? Several methods of labor induction may be used, such as:  Prostaglandin medicine. This medicine causes the cervix to dilate and ripen. The medicine will also start contractions. It can be taken by mouth or by inserting a suppository into the vagina.  Inserting a thin tube (catheter) with a balloon on the end into the vagina to dilate the cervix. Once inserted, the balloon is expanded  with water, which causes the cervix to open.  Stripping the membranes. Your health care provider separates amniotic sac tissue from the cervix, causing the cervix to be stretched and causing the release of a hormone called progesterone. This may cause the uterus to contract. It is often done during an office visit. You will be sent home to wait for the contractions to begin. You will then come in for an induction.  Breaking the water. Your health care provider makes a hole in the amniotic sac using a small instrument. Once the amniotic sac breaks, contractions should begin. This may still take hours to see an effect.  Medicine to trigger or strengthen contractions. This medicine is given through an IV access tube inserted into a vein in your arm.  All of the methods of induction, besides stripping the membranes, will be done in the hospital. Induction is done in the hospital so that you and the baby can be carefully monitored. How long does it take for labor to be induced? Some inductions can take up to 2-3 days. Depending on the cervix, it usually takes less time. It takes longer when you are induced early in the pregnancy or if this is your first pregnancy. If a mother is still pregnant and the induction has been going on for 2-3 days, either the mother will be sent home or a cesarean delivery will be needed. What are the risks associated with labor induction? Some of the risks   of induction include:  Changes in fetal heart rate, such as too high, too low, or erratic.  Fetal distress.  Chance of infection for the mother and baby.  Increased chance of having a cesarean delivery.  Breaking off (abruption) of the placenta from the uterus (rare).  Uterine rupture (very rare).  When induction is needed for medical reasons, the benefits of induction may outweigh the risks. What are some reasons for not inducing labor? Labor induction should not be done if:  It is shown that your baby does  not tolerate labor.  You have had previous surgeries on your uterus, such as a myomectomy or the removal of fibroids.  Your placenta lies very low in the uterus and blocks the opening of the cervix (placenta previa).  Your baby is not in a head-down position.  The umbilical cord drops down into the birth canal in front of the baby. This could cut off the baby's blood and oxygen supply.  You have had a previous cesarean delivery.  There are unusual circumstances, such as the baby being extremely premature.  This information is not intended to replace advice given to you by your health care provider. Make sure you discuss any questions you have with your health care provider. Document Released: 12/01/2006 Document Revised: 12/18/2015 Document Reviewed: 02/08/2013 Elsevier Interactive Patient Education  2017 [employer]  

## 2017-12-12 NOTE — Progress Notes (Unsigned)
IOL form completed for 37 wks.

## 2017-12-12 NOTE — Progress Notes (Signed)
PRENATAL VISIT NOTE  Subjective:  Alexandra Meadows is a 22 y.o. G2P1001 at [redacted]w[redacted]d being seen today for ongoing prenatal care.  She is currently monitored for the following issues for this high-risk pregnancy and has ADHD; MIGRAINE, COMMON W/O INTRACTABLE MIGRAINE; METRORRHAGIA; INSOMNIA; Depression during pregnancy in third trimester; Anxiety and depression; Intrinsic asthma; Supervision of high-risk pregnancy; Vitamin D deficiency; Uterine size date discrepancy pregnancy, third trimester; and Umbilical vein abnormality affecting pregnancy on their problem list.  Patient reports occasional contractions.  Contractions: Irregular. Vag. Bleeding: None.  Movement: Present. Denies leaking of fluid.   The following portions of the patient's history were reviewed and updated as appropriate: allergies, current medications, past family history, past medical history, past social history, past surgical history and problem list. Problem list updated.  Objective:   Vitals:   12/12/17 1535  BP: 119/76  Pulse: (!) 103  Weight: 165 lb 12.8 oz (75.2 kg)    Fetal Status: Fetal Heart Rate (bpm): 135 Fundal Height: 32 cm Movement: Present  Presentation: Vertex  General:  Alert, oriented and cooperative. Patient is in no acute distress.  Skin: Skin is warm and dry. No rash noted.   Cardiovascular: Normal heart rate noted  Respiratory: Normal respiratory effort, no problems with respiration noted  Abdomen: Soft, gravid, appropriate for gestational age.  Pain/Pressure: Present     Pelvic: Cervical exam performed Dilation: 1 Effacement (%): 50 Station: Ballotable  Extremities: Normal range of motion.  Edema: Trace  Mental Status: Normal mood and affect. Normal behavior. Normal judgment and thought content.   [place] Mfm Fetal Bpp Wo Non Stress  Result Date: 12/09/2017 ----------------------------------------------------------------------  OBSTETRICS REPORT                      (Signed Final 12/09/2017 05:15  pm) ---------------------------------------------------------------------- Patient Info  ID #:       [id number]                          D.O.B.:  08-21-1995 (22 yrs)  Name:       Alexandra Meadows              Visit Date: 12/09/2017 04:27 pm ---------------------------------------------------------------------- Performed By  Performed By:     Eden Lathe BS      Ref. Address:     [address]                    [place]                                                             [address]                                                             [place], [place]  [address]  Attending:        Durwin Nora       Location:         [facility]                    MD  Referred By:      Brock Bad MD ---------------------------------------------------------------------- Orders   #  Description                                 Code   1  [place] MFM OB FOLLOW UP                         [id number]   2  [place] MFM FETAL BPP WO NON STRESS              76819.01  ----------------------------------------------------------------------   #  Ordered By               Order #        Accession #    Episode #   1  RACHELLE DENNEY          [id number]      [id number]     [id number]   2  RACHELLE DENNEY          [id number]      [id number]     [id number]  ---------------------------------------------------------------------- Indications   [redacted] weeks gestation of pregnancy                Z3A.39   Small for gestational age fetus affecting      O36.5990   management of mother   Umbilical vein abnormality complicating        O69.9XX0   pregnancy   Encounter for other antenatal screening        Z36.2   follow-up  ---------------------------------------------------------------------- OB History  Blood Type:            Height:  5'3"   Weight (lb):  150       BMI:  26.57  Gravidity:    2         Term:   1        Prem:   0        SAB:   0  TOP:          0       Ectopic:  0         Living: 1 ---------------------------------------------------------------------- Fetal Evaluation  Num Of Fetuses:     1  Fetal Heart         136  Rate(bpm):  Cardiac Activity:   Observed  Presentation:       Cephalic  Placenta:           Anterior, above cervical os  P. Cord Insertion:  Visualized  Amniotic Fluid  AFI FV:      Subjectively within normal limits  AFI Sum(cm)     %Tile       Largest Pocket(cm)  17.02           62          5.94  RUQ(cm)       RLQ(cm)       LUQ(cm)        LLQ(cm)  5.94          3.54          4.17           3.38 ---------------------------------------------------------------------- Biophysical Evaluation  Amniotic F.V:   Within normal limits       F. Tone:        Observed  F. Movement:    Observed                   Score:          8/8  F. Breathing:   Observed ---------------------------------------------------------------------- Biometry  BPD:      88.1  mm     G. Age:  35w 4d         78  %    CI:        79.73   %    70 - 86                                                          FL/HC:      19.3   %    20.1 - 22.3  HC:      311.8  mm     G. Age:  34w 6d         23  %    HC/AC:      1.02        0.93 - 1.11  AC:      304.7  mm     G. Age:  34w 3d         51  %    FL/BPD:     68.2   %    71 - 87  FL:       60.1  mm     G. Age:  31w 2d        < 3  %    FL/AC:      19.7   %    20 - 24  HUM:      52.2  mm     G. Age:  30w 3d        < 5  %  Est. FW:    2258  gm           5 lb     43  % ---------------------------------------------------------------------- Gestational Age  LMP:           34w 4d        Date:  04/11/17                 EDD:   01/16/18  U/S Today:     34w 0d                                        EDD:   01/20/18  Best:          34w 4d     Det. By:  LMP  (04/11/17)          EDD:   01/16/18 ---------------------------------------------------------------------- Anatomy  Cranium:               Appears normal         Aortic Arch:  Previously seen  Cavum:                  Appears normal         Ductal Arch:            Previously seen  Ventricles:            Appears normal         Diaphragm:              Previously seen  Choroid Plexus:        Appears normal         Stomach:                Appears normal, left                                                                        sided  Cerebellum:            Appears normal         Abdomen:                Umbilical vein                                                                        varix  Posterior Fossa:       Previously seen        Abdominal Wall:         Previously seen  Nuchal Fold:           Previously seen        Cord Vessels:           Previously seen  Face:                  Profile previously     Kidneys:                Appear normal                         seen  Lips:                  Previously seen        Bladder:                Appears normal  Thoracic:              Appears normal         Spine:                  Previously seen  Heart:                 Appears normal         Upper Extremities:      Previously seen                         (4CH, axis, and situs  RVOT:                  Previously seen        Lower Extremities:      Previously seen  LVOT:                  Previously seen  Other:  Female gender. Heels previously visualized. Nasal bone previously          visualized. ---------------------------------------------------------------------- Cervix Uterus Adnexa  Cervix  Not visualized (advanced GA >29wks)  Uterus  No abnormality visualized.  Left Ovary  Within normal limits.  Right Ovary  Within normal limits.  Cul De Sac:   No free fluid seen.  Adnexa:       No abnormality visualized. ---------------------------------------------------------------------- Impression  Single living intrauterine pregnancy at 34w 4d.  Cephalic presentation.  Placenta Anterior, above cervical os.  Normal amniotic fluid volume.  Appropriate interval fetal growth.  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  Arranged Tues/Fri BPP/limited ultrasound  Delivery is recommended by 37 weeks ----------------------------------------------------------------------               Durwin Nora, MD Electronically Signed Final Report   12/09/2017 05:15 pm ----------------------------------------------------------------------  [place] Mfm Ob Follow Up  Result Date: 12/09/2017 ----------------------------------------------------------------------  OBSTETRICS REPORT                      (Signed Final 12/09/2017 05:15 pm) ---------------------------------------------------------------------- Patient Info  ID #:       [id number]                          D.O.B.:  12-21-1995 (22 yrs)  Name:       Alexandra Meadows              Visit Date: 12/09/2017 04:27 pm ---------------------------------------------------------------------- Performed By  Performed By:     Eden Lathe BS      Ref. Address:     [address]                    [address]                                                             [address]                                                             [place], [place]                                                             [address]  Attending:        Durwin Nora       Location:  [facility]                    MD  Referred By:      Brock Bad MD ---------------------------------------------------------------------- Orders   #  Description                                 Code   1  [place] MFM OB FOLLOW UP                         76816.01   2  [place] MFM FETAL BPP WO NON STRESS              76819.01  ----------------------------------------------------------------------   #  Ordered By               Order #        Accession #    Episode #   1  RACHELLE DENNEY          [id number]      [id number]     [id number]   2  RACHELLE DENNEY           [id number]      [id number]     [id number]  ---------------------------------------------------------------------- Indications   [redacted] weeks gestation of pregnancy                Z3A.20   Small for gestational age fetus affecting      O36.5990   management of mother   Umbilical vein abnormality complicating        O69.9XX0   pregnancy   Encounter for other antenatal screening        Z36.2   follow-up  ---------------------------------------------------------------------- OB History  Blood Type:            Height:  5'3"   Weight (lb):  150       BMI:  26.57  Gravidity:    2         Term:   1        Prem:   0        SAB:   0  TOP:          0       Ectopic:  0        Living: 1 ---------------------------------------------------------------------- Fetal Evaluation  Num Of Fetuses:     1  Fetal Heart         136  Rate(bpm):  Cardiac Activity:   Observed  Presentation:       Cephalic  Placenta:           Anterior, above cervical os  P. Cord Insertion:  Visualized  Amniotic Fluid  AFI FV:      Subjectively within normal limits  AFI Sum(cm)     %Tile       Largest Pocket(cm)  17.02           62          5.94  RUQ(cm)       RLQ(cm)       LUQ(cm)        LLQ(cm)  5.94          3.54          4.17  3.38 ---------------------------------------------------------------------- Biophysical Evaluation  Amniotic F.V:   Within normal limits       F. Tone:        Observed  F. Movement:    Observed                   Score:          8/8  F. Breathing:   Observed ---------------------------------------------------------------------- Biometry  BPD:      88.1  mm     G. Age:  35w 4d         78  %    CI:        79.73   %    70 - 86                                                          FL/HC:      19.3   %    20.1 - 22.3  HC:      311.8  mm     G. Age:  34w 6d         23  %    HC/AC:      1.02        0.93 - 1.11  AC:      304.7  mm     G. Age:  34w 3d         51  %    FL/BPD:     68.2   %    71 - 87  FL:       60.1  mm     G. Age:  31w  2d        < 3  %    FL/AC:      19.7   %    20 - 24  HUM:      52.2  mm     G. Age:  30w 3d        < 5  %  Est. FW:    2258  gm           5 lb     43  % ---------------------------------------------------------------------- Gestational Age  LMP:           34w 4d        Date:  04/11/17                 EDD:   01/16/18  U/S Today:     34w 0d                                        EDD:   01/20/18  Best:          34w 4d     Det. By:  LMP  (04/11/17)          EDD:   01/16/18 ---------------------------------------------------------------------- Anatomy  Cranium:               Appears normal         Aortic Arch:            Previously seen  Cavum:                 Appears  normal         Ductal Arch:            Previously seen  Ventricles:            Appears normal         Diaphragm:              Previously seen  Choroid Plexus:        Appears normal         Stomach:                Appears normal, left                                                                        sided  Cerebellum:            Appears normal         Abdomen:                Umbilical vein                                                                        varix  Posterior Fossa:       Previously seen        Abdominal Wall:         Previously seen  Nuchal Fold:           Previously seen        Cord Vessels:           Previously seen  Face:                  Profile previously     Kidneys:                Appear normal                         seen  Lips:                  Previously seen        Bladder:                Appears normal  Thoracic:              Appears normal         Spine:                  Previously seen  Heart:                 Appears normal         Upper Extremities:      Previously seen                         (4CH, axis, and situs  RVOT:                  Previously seen  Lower Extremities:      Previously seen  LVOT:                  Previously seen  Other:  Female gender. Heels previously visualized. Nasal bone previously           visualized. ---------------------------------------------------------------------- Cervix Uterus Adnexa  Cervix  Not visualized (advanced GA >29wks)  Uterus  No abnormality visualized.  Left Ovary  Within normal limits.  Right Ovary  Within normal limits.  Cul De Sac:   No free fluid seen.  Adnexa:       No abnormality visualized. ---------------------------------------------------------------------- Impression  Single living intrauterine pregnancy at 34w 4d.  Cephalic presentation.  Placenta Anterior, above cervical os.  Normal amniotic fluid volume.  Appropriate interval fetal growth.  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  Arranged Tues/Fri BPP/limited ultrasound  Delivery is recommended by 37 weeks ----------------------------------------------------------------------               Durwin Nora, MD Electronically Signed Final Report   12/09/2017 05:15 pm ----------------------------------------------------------------------   Assessment and Plan:  Pregnancy: G2P1001 at [redacted]w[redacted]d  1. Umbilical vein abnormality affecting pregnancy, single or unspecified fetus Continue 2x/week BPP and UVV color angiography as per MFM IOL scheduled at 37 weeks The nature of [facility] with multiple MDs and other Advanced Practice Providers was explained to patient; also emphasized that residents, students are part of our team.  2. Depression during pregnancy in third trimester Controlled on Zoloft 100 mg qd  3. Supervision of high risk pregnancy in third trimester Pelvic cultures done - Strep Gp B NAA - Cervicovaginal ancillary only Preterm labor symptoms and general obstetric precautions including but not limited to vaginal bleeding, contractions, leaking of fluid and fetal movement were  reviewed in detail with the patient. Please refer to After Visit Summary for other counseling recommendations.  Return in about 1 week (around 12/19/2017) for OB Visit (schedule on Tuesday or Friday before or after MFM u/s with [place] [place]).  Future Appointments  Date Time Provider Department Center  12/13/2017  3:30 PM WH-MFC [place] 1 WH-MFCUS MFC-US  12/16/2017  3:30 PM WH-MFC [place] 5 WH-MFCUS MFC-US  12/20/2017  3:30 PM WH-MFC [place] 1 WH-MFCUS MFC-US  12/23/2017  3:15 PM WH-MFC [place] 4 WH-MFCUS MFC-US  12/26/2017  7:00 AM WH-BSSCHED ROOM WH-BSSCHED None    Jaynie Collins, MD

## 2017-12-13 ENCOUNTER — Encounter (HOSPITAL_COMMUNITY): Payer: Self-pay

## 2017-12-13 ENCOUNTER — Ambulatory Visit (HOSPITAL_COMMUNITY)
Admission: RE | Admit: 2017-12-13 | Discharge: 2017-12-13 | Disposition: A | Discharge status: Home / Self Care | Payer: BLUE CROSS/BLUE SHIELD | Source: Ambulatory Visit | Attending: Certified Nurse Midwife | Admitting: Certified Nurse Midwife

## 2017-12-13 DIAGNOSIS — IMO0001 Reserved for inherently not codable concepts without codable children: Secondary | ICD-10-CM

## 2017-12-13 DIAGNOSIS — Z3A35 35 weeks gestation of pregnancy: Secondary | ICD-10-CM | POA: Diagnosis not present

## 2017-12-13 DIAGNOSIS — O0993 Supervision of high risk pregnancy, unspecified, third trimester: Secondary | ICD-10-CM

## 2017-12-13 LAB — CERVICOVAGINAL ANCILLARY ONLY
Chlamydia: NEGATIVE
Neisseria Gonorrhea: NEGATIVE

## 2017-12-14 ENCOUNTER — Encounter: Payer: Self-pay | Admitting: Obstetrics & Gynecology

## 2017-12-14 ENCOUNTER — Encounter: Payer: Self-pay | Admitting: Certified Nurse Midwife

## 2017-12-14 LAB — STREP GP B NAA: STREP GROUP B AG: NEGATIVE

## 2017-12-16 ENCOUNTER — Other Ambulatory Visit (HOSPITAL_COMMUNITY): Payer: Self-pay | Admitting: Obstetrics and Gynecology

## 2017-12-16 ENCOUNTER — Ambulatory Visit (HOSPITAL_COMMUNITY)
Admission: RE | Admit: 2017-12-16 | Discharge: 2017-12-16 | Disposition: A | Discharge status: Home / Self Care | Payer: Medicaid Other | Source: Ambulatory Visit | Attending: Obstetrics | Admitting: Obstetrics

## 2017-12-16 ENCOUNTER — Telehealth: Payer: Self-pay

## 2017-12-16 ENCOUNTER — Encounter (HOSPITAL_COMMUNITY): Payer: Self-pay

## 2017-12-16 DIAGNOSIS — Z3A35 35 weeks gestation of pregnancy: Secondary | ICD-10-CM | POA: Diagnosis not present

## 2017-12-16 DIAGNOSIS — IMO0001 Reserved for inherently not codable concepts without codable children: Secondary | ICD-10-CM

## 2017-12-16 DIAGNOSIS — O365930 Maternal care for other known or suspected poor fetal growth, third trimester, not applicable or unspecified: Secondary | ICD-10-CM | POA: Insufficient documentation

## 2017-12-16 DIAGNOSIS — O0993 Supervision of high risk pregnancy, unspecified, third trimester: Secondary | ICD-10-CM

## 2017-12-16 NOTE — Telephone Encounter (Signed)
Pt called c/o decreased FM and cramping since 9am and not feeling well.  Pt advised to go to [facility] for eval.

## 2017-12-16 NOTE — Telephone Encounter (Signed)
Return call to pt regarding message Pt requested call from [facility], CNM  She did not leave details. I left a detailed message for her to contact the office and let us know her request being that Boykin Reaper is currently seeing patients and I let her know we close at 12pm.

## 2017-12-20 ENCOUNTER — Ambulatory Visit (HOSPITAL_COMMUNITY)
Admission: RE | Admit: 2017-12-20 | Discharge: 2017-12-20 | Disposition: A | Discharge status: Home / Self Care | Payer: BLUE CROSS/BLUE SHIELD | Source: Ambulatory Visit | Attending: Family Medicine | Admitting: Family Medicine

## 2017-12-20 ENCOUNTER — Encounter (HOSPITAL_COMMUNITY): Payer: Self-pay

## 2017-12-20 ENCOUNTER — Other Ambulatory Visit (HOSPITAL_COMMUNITY): Payer: Self-pay | Admitting: Obstetrics and Gynecology

## 2017-12-20 DIAGNOSIS — Z3A36 36 weeks gestation of pregnancy: Secondary | ICD-10-CM | POA: Diagnosis not present

## 2017-12-20 DIAGNOSIS — IMO0001 Reserved for inherently not codable concepts without codable children: Secondary | ICD-10-CM

## 2017-12-20 DIAGNOSIS — O0993 Supervision of high risk pregnancy, unspecified, third trimester: Secondary | ICD-10-CM

## 2017-12-21 ENCOUNTER — Ambulatory Visit (INDEPENDENT_AMBULATORY_CARE_PROVIDER_SITE_OTHER): Payer: Medicaid Other | Admitting: Obstetrics and Gynecology

## 2017-12-21 VITALS — BP 117/77 | HR 118 | Wt <= 1120 oz

## 2017-12-21 DIAGNOSIS — IMO0001 Reserved for inherently not codable concepts without codable children: Secondary | ICD-10-CM

## 2017-12-21 DIAGNOSIS — O0993 Supervision of high risk pregnancy, unspecified, third trimester: Secondary | ICD-10-CM

## 2017-12-21 DIAGNOSIS — O358XX0 Maternal care for other (suspected) fetal abnormality and damage, not applicable or unspecified: Secondary | ICD-10-CM

## 2017-12-21 NOTE — Patient Instructions (Signed)
Contraception Choices Contraception, also called birth control, refers to methods or devices that prevent pregnancy. Hormonal methods Contraceptive implant A contraceptive implant is a thin, plastic tube that contains a hormone. It is inserted into the upper part of the arm. It can remain in place for up to 3 years. Progestin-only injections Progestin-only injections are injections of progestin, a synthetic form of the hormone progesterone. They are given every 3 months by a health care provider. Birth control pills Birth control pills are pills that contain hormones that prevent pregnancy. They must be taken once a day, preferably at the same time each day. Birth control patch The birth control patch contains hormones that prevent pregnancy. It is placed on the skin and must be changed once a week for three weeks and removed on the fourth week. A prescription is needed to use this method of contraception. Vaginal ring A vaginal ring contains hormones that prevent pregnancy. It is placed in the vagina for three weeks and removed on the fourth week. After that, the process is repeated with a new ring. A prescription is needed to use this method of contraception. Emergency contraceptive Emergency contraceptives prevent pregnancy after unprotected sex. They come in pill form and can be taken up to 5 days after sex. They work best the sooner they are taken after having sex. Most emergency contraceptives are available without a prescription. This method should not be used as your only form of birth control. Barrier methods Female condom A female condom is a thin sheath that is worn over the penis during sex. Condoms keep sperm from going inside a woman's body. They can be used with a spermicide to increase their effectiveness. They should be disposed after a single use. Female condom A female condom is a soft, loose-fitting sheath that is put into the vagina before sex. The condom keeps sperm from going  inside a woman's body. They should be disposed after a single use. Intrauterine contraception Intrauterine device (IUD) An IUD is a T-shaped device that is put in a woman's uterus. There are two types:  Hormone IUD.This type contains progestin, a synthetic form of the hormone progesterone. This type can stay in place for 3-5 years.  Copper IUD.This type is wrapped in copper wire. It can stay in place for 10 years.  Permanent methods of contraception Female tubal ligation In this method, a woman's fallopian tubes are sealed, tied, or blocked during surgery to prevent eggs from traveling to the uterus. Female sterilization This is a procedure to tie off the tubes that carry sperm (vasectomy). After the procedure, the man can still ejaculate fluid (semen). Natural planning methods Natural family planning In this method, a couple does not have sex on days when the woman could become pregnant. Calendar method This means keeping track of the length of each menstrual cycle, identifying the days when pregnancy can happen, and not having sex on those days. Ovulation method In this method, a couple avoids sex during ovulation. Symptothermal method This method involves not having sex during ovulation. The woman typically checks for ovulation by watching changes in her temperature and in the consistency of cervical mucus. Post-ovulation method In this method, a couple waits to have sex until after ovulation. Summary  Contraception, also called birth control, means methods or devices that prevent pregnancy.  Hormonal methods of contraception include implants, injections, pills, patches, vaginal rings, and emergency contraceptives.  Barrier methods of contraception can include female condoms, female condoms, diaphragms, cervical caps, sponges, and spermicides.  There are two types of IUDs (intrauterine devices). An IUD can be put in a woman's uterus to prevent pregnancy for 3-5 years.  Permanent  sterilization can be done through a procedure for males, females, or both.  Natural family planning methods involve not having sex on days when the woman could become pregnant. This information is not intended to replace advice given to you by your health care provider. Make sure you discuss any questions you have with your health care provider. Document Released: 07/12/2005 Document Revised: 08/14/2016 Document Reviewed: 08/14/2016 Elsevier Interactive Patient Education  2018 [employer].

## 2017-12-21 NOTE — Progress Notes (Signed)
   PRENATAL VISIT NOTE  Subjective:  Alexandra Meadows is a 22 y.o. G2P1001 at [redacted]w[redacted]d being seen today for ongoing prenatal care.  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  Patient reports occasional contractions.  Contractions: Irregular. Vag. Bleeding: None.  Movement: Present. Denies leaking of fluid.   The following portions of the patient's history were reviewed and updated as appropriate: allergies, current medications, past family history, past medical history, past social history, past surgical history and problem list. Problem list updated.  Objective:   Vitals:   12/21/17 1613  BP: 117/77  Pulse: (!) 118  Weight: 16 lb 6.4 oz (7.439 kg)    Fetal Status: Fetal Heart Rate (bpm): 138   Movement: Present     General:  Alert, oriented and cooperative. Patient is in no acute distress.  Skin: Skin is warm and dry. No rash noted.   Cardiovascular: Normal heart rate noted  Respiratory: Normal respiratory effort, no problems with respiration noted  Abdomen: Soft, gravid, appropriate for gestational age.  Pain/Pressure: Present     Pelvic: Cervical exam deferred        Extremities: Normal range of motion.  Edema: Trace  Mental Status: Normal mood and affect. Normal behavior. Normal judgment and thought content.   Assessment and Plan:  Pregnancy: G2P1001 at [redacted]w[redacted]d  1. Umbilical vein abnormality affecting pregnancy, single or unspecified fetus 2x weekly BPP, last yesterday, no  result yet, per patient, was without worsening complication IOL scheduled for 37 weeks on 12/26/17  2. 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 Supervision of high risk pregnancy in third trimester The nature of Alexandra Meadows - [facility] with multiple MDs and other Advanced Practice Providers was explained to patient; also emphasized that residents, students are part of our team.   Preterm labor symptoms and general obstetric precautions including but not limited to vaginal bleeding, contractions, leaking of fluid and fetal movement were reviewed in detail with the patient. Please refer to After Visit Summary for other counseling recommendations.  No follow-ups on file.  Future Appointments  Date Time Provider Department Center  12/23/2017  3:15 PM [facility] [place] 4 [facility]  12/26/2017  7:00 AM [facility]-BSS[facility]ED ROOM [facility] None  01/23/2018  3:30 PM Brock Bad, MD [facility] None    Conan Bowens, MD

## 2017-12-23 ENCOUNTER — Encounter (HOSPITAL_COMMUNITY): Payer: Self-pay

## 2017-12-23 ENCOUNTER — Ambulatory Visit (HOSPITAL_COMMUNITY)
Admission: RE | Admit: 2017-12-23 | Discharge: 2017-12-23 | Disposition: A | Discharge status: Home / Self Care | Payer: BLUE CROSS/BLUE SHIELD | Source: Ambulatory Visit | Attending: Obstetrics | Admitting: Obstetrics

## 2017-12-23 DIAGNOSIS — Z3A36 36 weeks gestation of pregnancy: Secondary | ICD-10-CM | POA: Diagnosis not present

## 2017-12-23 DIAGNOSIS — O0993 Supervision of high risk pregnancy, unspecified, third trimester: Secondary | ICD-10-CM

## 2017-12-23 DIAGNOSIS — IMO0001 Reserved for inherently not codable concepts without codable children: Secondary | ICD-10-CM

## 2017-12-26 ENCOUNTER — Inpatient Hospital Stay (HOSPITAL_COMMUNITY): Payer: BLUE CROSS/BLUE SHIELD | Admitting: Anesthesiology

## 2017-12-26 ENCOUNTER — Encounter (HOSPITAL_COMMUNITY): Payer: Self-pay

## 2017-12-26 ENCOUNTER — Inpatient Hospital Stay (HOSPITAL_COMMUNITY)
Admission: RE | Admit: 2017-12-26 | Discharge: 2017-12-28 | DRG: 807 | Disposition: A | Discharge status: Home / Self Care | Payer: BLUE CROSS/BLUE SHIELD | Attending: Obstetrics and Gynecology | Admitting: Obstetrics and Gynecology

## 2017-12-26 DIAGNOSIS — J45909 Unspecified asthma, uncomplicated: Secondary | ICD-10-CM | POA: Diagnosis present

## 2017-12-26 DIAGNOSIS — O9952 Diseases of the respiratory system complicating childbirth: Secondary | ICD-10-CM | POA: Diagnosis present

## 2017-12-26 DIAGNOSIS — O358XX0 Maternal care for other (suspected) fetal abnormality and damage, not applicable or unspecified: Secondary | ICD-10-CM | POA: Diagnosis present

## 2017-12-26 DIAGNOSIS — F329 Major depressive disorder, single episode, unspecified: Secondary | ICD-10-CM | POA: Diagnosis present

## 2017-12-26 DIAGNOSIS — Z3A37 37 weeks gestation of pregnancy: Secondary | ICD-10-CM | POA: Diagnosis not present

## 2017-12-26 DIAGNOSIS — O365930 Maternal care for other known or suspected poor fetal growth, third trimester, not applicable or unspecified: Secondary | ICD-10-CM | POA: Diagnosis present

## 2017-12-26 DIAGNOSIS — O99344 Other mental disorders complicating childbirth: Secondary | ICD-10-CM | POA: Diagnosis present

## 2017-12-26 DIAGNOSIS — O0990 Supervision of high risk pregnancy, unspecified, unspecified trimester: Secondary | ICD-10-CM

## 2017-12-26 DIAGNOSIS — O99343 Other mental disorders complicating pregnancy, third trimester: Secondary | ICD-10-CM

## 2017-12-26 HISTORY — DX: Anemia, unspecified: D64.9

## 2017-12-26 HISTORY — DX: Unspecified infectious disease: B99.9

## 2017-12-26 LAB — RPR: RPR: NONREACTIVE

## 2017-12-26 LAB — CBC
HCT: 31.8 % — ABNORMAL LOW (ref 36.0–46.0)
Hemoglobin: 10.3 g/dL — ABNORMAL LOW (ref 12.0–15.0)
MCH: 26.5 pg (ref 26.0–34.0)
MCHC: 32.4 g/dL (ref 30.0–36.0)
MCV: 82 fL (ref 78.0–100.0)
Platelets: 293 10*3/uL (ref 150–400)
RBC: 3.88 MIL/uL (ref 3.87–5.11)
RDW: 14.8 % (ref 11.5–15.5)
WBC: 11 10*3/uL — ABNORMAL HIGH (ref 4.0–10.5)

## 2017-12-26 LAB — TYPE AND SCREEN
ABO/RH(D): O POS
Antibody Screen: NEGATIVE

## 2017-12-26 MED ORDER — LACTATED RINGERS IV SOLN: 500.0000 mL | Freq: Once | INTRAVENOUS | Status: DC

## 2017-12-26 MED ORDER — LACTATED RINGERS IV SOLN
INTRAVENOUS | Status: DC
  Administered 2017-12-26 (×3): via INTRAVENOUS

## 2017-12-26 MED ORDER — FENTANYL CITRATE (PF) 100 MCG/2ML IJ SOLN
50.0000 ug | INTRAMUSCULAR | Status: DC | PRN
  Administered 2017-12-26: 50 ug via INTRAVENOUS
  Administered 2017-12-26 (×2): 100 ug via INTRAVENOUS
  Filled 2017-12-26 (×3): qty 2

## 2017-12-26 MED ORDER — HYDROXYZINE HCL 50 MG PO TABS
50.0000 mg | ORAL_TABLET | Freq: Four times a day (QID) | ORAL | Status: DC | PRN
  Filled 2017-12-26: qty 1

## 2017-12-26 MED ORDER — ZOLPIDEM TARTRATE 5 MG PO TABS: 5.0000 mg | ORAL_TABLET | Freq: Every evening | ORAL | Status: DC | PRN

## 2017-12-26 MED ORDER — DIPHENHYDRAMINE HCL 50 MG/ML IJ SOLN
12.5000 mg | INTRAMUSCULAR | Status: DC | PRN
  Administered 2017-12-27: 12.5 mg via INTRAVENOUS
  Filled 2017-12-26: qty 1

## 2017-12-26 MED ORDER — LACTATED RINGERS IV SOLN
500.0000 mL | INTRAVENOUS | Status: DC | PRN
  Administered 2017-12-26: 500 mL via INTRAVENOUS

## 2017-12-26 MED ORDER — OXYCODONE-ACETAMINOPHEN 5-325 MG PO TABS: 1.0000 | ORAL_TABLET | ORAL | Status: DC | PRN

## 2017-12-26 MED ORDER — PHENYLEPHRINE 40 MCG/ML (10ML) SYRINGE FOR IV PUSH (FOR BLOOD PRESSURE SUPPORT)
80.0000 ug | PREFILLED_SYRINGE | INTRAVENOUS | Status: DC | PRN
  Filled 2017-12-26: qty 10
  Filled 2017-12-26: qty 5

## 2017-12-26 MED ORDER — OXYTOCIN BOLUS FROM INFUSION
500.0000 mL | Freq: Once | INTRAVENOUS | Status: AC
  Administered 2017-12-27: 500 mL via INTRAVENOUS

## 2017-12-26 MED ORDER — ONDANSETRON HCL 4 MG/2ML IJ SOLN
4.0000 mg | Freq: Four times a day (QID) | INTRAMUSCULAR | Status: DC | PRN
  Administered 2017-12-26: 4 mg via INTRAVENOUS
  Filled 2017-12-26: qty 2

## 2017-12-26 MED ORDER — LIDOCAINE HCL (PF) 1 % IJ SOLN
30.0000 mL | INTRAMUSCULAR | Status: DC | PRN
  Filled 2017-12-26: qty 30

## 2017-12-26 MED ORDER — EPHEDRINE 5 MG/ML INJ
10.0000 mg | INTRAVENOUS | Status: DC | PRN
  Filled 2017-12-26: qty 2

## 2017-12-26 MED ORDER — TERBUTALINE SULFATE 1 MG/ML IJ SOLN
0.2500 mg | Freq: Once | INTRAMUSCULAR | Status: DC | PRN
  Filled 2017-12-26: qty 1

## 2017-12-26 MED ORDER — OXYCODONE-ACETAMINOPHEN 5-325 MG PO TABS: 2.0000 | ORAL_TABLET | ORAL | Status: DC | PRN

## 2017-12-26 MED ORDER — ACETAMINOPHEN 325 MG PO TABS: 650.0000 mg | ORAL_TABLET | ORAL | Status: DC | PRN

## 2017-12-26 MED ORDER — OXYTOCIN 40 UNITS IN LACTATED RINGERS INFUSION - SIMPLE MED: 2.5000 [IU]/h | INTRAVENOUS | Status: DC

## 2017-12-26 MED ORDER — LIDOCAINE HCL (PF) 1 % IJ SOLN
INTRAMUSCULAR | Status: DC | PRN
  Administered 2017-12-26: 7 mL via EPIDURAL
  Administered 2017-12-26: 5 mL via EPIDURAL

## 2017-12-26 MED ORDER — OXYTOCIN 40 UNITS IN LACTATED RINGERS INFUSION - SIMPLE MED
1.0000 m[IU]/min | INTRAVENOUS | Status: DC
  Administered 2017-12-26: 2 m[IU]/min via INTRAVENOUS
  Filled 2017-12-26: qty 1000

## 2017-12-26 MED ORDER — FENTANYL 2.5 MCG/ML BUPIVACAINE 1/10 % EPIDURAL INFUSION (WH - ANES)
14.0000 mL/h | INTRAMUSCULAR | Status: DC | PRN
  Administered 2017-12-26: 14 mL/h via EPIDURAL
  Filled 2017-12-26: qty 100

## 2017-12-26 MED ORDER — SOD CITRATE-CITRIC ACID 500-334 MG/5ML PO SOLN: 30.0000 mL | ORAL | Status: DC | PRN

## 2017-12-26 MED ORDER — FLEET ENEMA 7-19 GM/118ML RE ENEM: 1.0000 | ENEMA | Freq: Every day | RECTAL | Status: DC | PRN

## 2017-12-26 MED ORDER — PHENYLEPHRINE 40 MCG/ML (10ML) SYRINGE FOR IV PUSH (FOR BLOOD PRESSURE SUPPORT)
80.0000 ug | PREFILLED_SYRINGE | INTRAVENOUS | Status: DC | PRN
  Filled 2017-12-26: qty 5

## 2017-12-26 MED ORDER — MISOPROSTOL 25 MCG QUARTER TABLET
25.0000 ug | ORAL_TABLET | ORAL | Status: DC | PRN
  Administered 2017-12-26 (×2): 25 ug via VAGINAL
  Filled 2017-12-26 (×3): qty 1

## 2017-12-26 NOTE — Progress Notes (Signed)
LABOR PROGRESS NOTE  Alexandra Meadows is a 22 y.o. G2P1001 at 7749w0d  admitted for IOL for UVV  Subjective: Patient doing well  Objective: BP 122/70 (BP Location: Left Arm)   Pulse 90   Temp 98.3 F (36.8 C) (Oral)   Resp 18   Ht 5\' 4"  (1.626 m)   Wt 77.8 kg (171 lb 8 oz)   LMP 04/11/2017 (Exact Date)   BMI 29.44 kg/m  or  Vitals:   12/26/17 0721 12/26/17 0723 12/26/17 1202  BP:  134/64 122/70  Pulse:  97 90  Resp:  18 18  Temp:  98.1 F (36.7 C) 98.3 F (36.8 C)  TempSrc:  Oral Oral  Weight: 77.8 kg (171 lb 8 oz)    Height: 5\' 4"  (1.626 m)      SVE: Dilation: 1 Effacement (%): 50 Cervical Position: Middle Station: -2 Presentation: Vertex Exam by:: Dr Nira Retortegele FHT: baseline rate 135, moderate varibility, + acel, no decel Toco: UI  Assessment / Plan: 22 y.o. G2P1001 at 5549w0d here for IOL for UVV  Labor: FB and 2nd cytotec placed at 1215.  Fetal Wellbeing:  Cat I Pain Control:  Per patient's request Anticipated MOD:  SVD  Alexandra PearJulie P Vitali Seibert, MD 12/26/2017, 1:40 PM

## 2017-12-26 NOTE — Anesthesia Procedure Notes (Signed)
Epidural Patient location during procedure: OB Start time: 12/26/2017 9:18 PM End time: 12/26/2017 9:24 PM  Staffing Anesthesiologist: Leilani AbleHatchett, Evaleen Sant, MD Performed: anesthesiologist   Preanesthetic Checklist Completed: patient identified, site marked, surgical consent, pre-op evaluation, timeout performed, IV checked, risks and benefits discussed and monitors and equipment checked  Epidural Patient position: sitting Prep: site prepped and draped and DuraPrep Patient monitoring: continuous pulse ox and blood pressure Approach: midline Location: L3-L4 Injection technique: LOR air  Needle:  Needle type: Tuohy  Needle gauge: 17 G Needle length: 9 cm and 9 Needle insertion depth: 4 cm Catheter type: closed end flexible Catheter size: 19 Gauge Catheter at skin depth: 9 cm Test dose: negative and Other  Assessment Events: blood not aspirated, injection not painful, no injection resistance, negative IV test and no paresthesia

## 2017-12-26 NOTE — Progress Notes (Signed)
LABOR PROGRESS NOTE  Alexandra Meadows is a 22 y.o. G2P1001 at 5719w0d  admitted for IOL for UVV.  Subjective: Patient doing well overall. She reports being frustrated about slow progress of labor. She is agreeable to AROM. Benefits and risks of AROM have been discussed with the patient. No other changes or complaints.  Objective: BP (!) 113/59   Pulse 83   Temp 98.6 F (37 C) (Axillary)   Resp 16   Ht 5\' 4"  (1.626 m)   Wt 171 lb 8 oz (77.8 kg)   LMP 04/11/2017 (Exact Date)   BMI 29.44 kg/m  or  Vitals:   12/26/17 1801 12/26/17 1831 12/26/17 1918 12/26/17 1958  BP: (!) 105/55 (!) 101/56 116/72 (!) 113/59  Pulse: 85 85 90 83  Resp: 17 17 16 16   Temp:   98.6 F (37 C)   TempSrc:   Axillary   Weight:      Height:         Dilation: 4.5 Effacement (%): 80 Cervical Position: Posterior Station: -2 Presentation: Vertex Exam by:: [place] , CNM FHT: Baseline: 130; Variability: Good (>6 bpm); Accelerations: Present; Decelerations: Absent Uterine activity: Contractions present; irregular (3-4 min)  Labs: Lab Results  Component Value Date   WBC 11.0 (H) 12/26/2017   HGB 10.3 (L) 12/26/2017   HCT 31.8 (L) 12/26/2017   MCV 82.0 12/26/2017   PLT 293 12/26/2017    Patient Active Problem List   Diagnosis Date Noted  . Labor and delivery indication for care or intervention 12/26/2017  . [redacted] weeks gestation of pregnancy   . 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 [redacted] weeks gestation of pregnancy   . Small for gestational age   . Uterine size date discrepancy pregnancy, third trimester   . Umbilical vein abnormality affecting pregnancy   . Vitamin D deficiency 07/05/2017  . Supervision of high-risk pregnancy 06/30/2017  . 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 Anxiety and depression 02/17/2014  . Depression during pregnancy in third trimester 11/29/2013  . INSOMNIA 03/20/2008  . ADHD 08/29/2007  . MIGRAINE, COMMON W/O INTRACTABLE MIGRAINE 03/29/2007     Assessment / Plan: 22 y.o. G2P1001 at 6319w0d here for IOl for UVV.   Labor: Labor progressing; s/p FB and cytotec; AROM completed Fetal Wellbeing:  Cat I Pain Control:  Pain currently manageable; patient required epidural closer to delivery Anticipated MOD:  SVD  Cyndee Brightlyonnor  M Brysun Eschmann, Medical Student 6/3/20198:48 PM

## 2017-12-26 NOTE — Anesthesia Pain Management Evaluation Note (Signed)
  CRNA Pain Management Visit Note  Patient: Alexandra Meadows, 22 y.o., female  "Hello I am a [facility] of the anesthesia team at [facility]. We have an anesthesia team available at all times to provide care throughout the hospital, including epidural management and anesthesia for C-section. I don't know your plan for the delivery whether it a natural birth, water birth, IV sedation, nitrous supplementation, doula or epidural, but we want to meet your pain goals."   1.Was your pain managed to your expectations on prior hospitalizations?   Yes   2.What is your expectation for pain management during this hospitalization?     Epidural  3.How can we help you reach that goal? Epidural when appropriate  Record the patient's initial score and the patient's pain goal.   Pain: 3  Pain Goal: 6 [facility] wants you to be able to say your pain was always managed very well.  Alexandra Meadows, Alexandra Meadows 12/26/2017

## 2017-12-26 NOTE — H&P (Signed)
LABOR AND DELIVERY ADMISSION HISTORY AND PHYSICAL NOTE  Alexandra Meadows is a 22 y.o. female G2P1001 with IUP at [redacted]w[redacted]d by LMP presenting for IOL d/t umbilical vein varix.  She reports positive fetal movement. She denies leakage of fluid or vaginal bleeding.  Prenatal History/Complications: PNC at [facility] Pregnancy complications:  -  UVV - Size < dates - Severe depression (on Zoloft) -   Past Medical History: Past Medical History:  Diagnosis Date  . Anemia   . Anxiety   . Asthma    prn inhaler; exercise induced  . Depression    severe at 20weeks  . 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 Infection    BV with preg; recurrent yeast inf  . Migraines   . Seasonal allergies   . Tonsillar hypertrophy 04/2016    Past Surgical History: Past Surgical History:  Procedure Laterality Date  . KNEE ARTHROSCOPY Left 02/28/2014   Procedure: ARTHROSCOPY LEFT KNEE WITH SYNOVECTOMY LIMITED, PARTIAL LATERAL MENISECTOMY;  Surgeon: Loreta Ave, MD;  Location: [facility];  Service: Orthopedics;  Laterality: Left;  . TONSILLECTOMY AND ADENOIDECTOMY Bilateral 05/25/2016   Procedure: TONSILLECTOMY AND ADENOIDECTOMY;  Surgeon: Newman Pies, MD;  Location:  SURGERY CENTER;  Service: ENT;  Laterality: Bilateral;  . WISDOM TOOTH EXTRACTION  2015    Obstetrical History: OB History    Gravida  2   Para  1   Term  1   Preterm      AB      Living  1     SAB      TAB      Ectopic      Multiple  0   Live Births  1           Social History: Social History   Socioeconomic History  . Marital status: Single    Spouse name: Not on file  . Number of children: Not on file  . Years of education: Not on file  . Highest education level: Not on file  Occupational History  . Not on file  Social Needs  . Financial resource strain: Not on file  . Food insecurity:    Worry: Not on file    Inability: Not on file  .  Transportation needs:    Medical: Not on file    Non-medical: Not on file  Tobacco Use  . Smoking status: Never Smoker  . Smokeless tobacco: Never Used  . Tobacco comment: quit  Substance and Sexual Activity  . Alcohol use: No    Alcohol/week: 0.0 oz  . Drug use: No  . Sexual activity: Yes    Partners: Male    Birth control/protection: None  Lifestyle  . Physical activity:    Days per week: Not on file    Minutes per session: Not on file  . Stress: Not on file  Relationships  . Social connections:    Talks on phone: Not on file    Gets together: Not on file    Attends religious service: Not on file    Active member of club or organization: Not on file    Attends meetings of clubs or organizations: Not on file    Relationship status: Not on file  Other Topics Concern  . Not on file  Social History Narrative   Negative history of passive tobacco smoke exposure   Caretaker verifies today that the child's current immunizations  are up to date.   Lives with parents in a 2 story home.  Currently not working.  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  Education: college.    Family History: Family History  Problem Relation Age of Onset  . Diabetes Mother   . Cancer Maternal Grandfather     Allergies: Allergies  Allergen Reactions  . Bee Venom Hives and Swelling  . Tetanus Toxoids Hives  . Xanax [Alprazolam] Other (See Comments)    SEIZURES    Medications Prior to Admission  Medication Sig Dispense Refill Last Dose  . acetaminophen (TYLENOL) 500 MG tablet Take 500-1,000 mg by mouth every 6 (six) hours as needed for mild pain, moderate pain or headache.    Past Week at Unknown time  . calcium carbonate (TUMS - DOSED IN MG ELEMENTAL CALCIUM) 500 MG chewable tablet Chew 2 tablets by mouth daily as needed for indigestion or heartburn.   Past Week at Unknown time  . cyclobenzaprine (FLEXERIL) 10 MG tablet Take 1 tablet (10 mg total) by mouth every 8  (eight) hours as needed for muscle spasms. 30 tablet 2 Past Week at Unknown time  . diphenhydrAMINE (BENADRYL) 25 MG tablet Take 25 mg by mouth daily as needed for allergies or sleep.   Past Week at Unknown time  . Doxylamine-Pyridoxine (DICLEGIS) 10-10 MG TBEC Take 1 tablet with breakfast and lunch.  Take 2 tablets at bedtime. 100 tablet 4 Past Month at Unknown time  . loratadine (CLARITIN) 10 MG tablet Take 1 tablet (10 mg total) by mouth daily. 30 tablet 11 Past Week at Unknown time  . Prenat-FeFum-FePo-FA-DHA w/o A (PROVIDA DHA) 16-16-1.25-110 MG CAPS Take 1 tablet by mouth at bedtime. 30 capsule 12 12/25/2017 at Unknown time  . sertraline (ZOLOFT) 100 MG tablet Take 1 tablet (100 mg total) by mouth daily. 30 tablet 12 Past Week at Unknown time  . albuterol (PROVENTIL HFA;VENTOLIN HFA) 108 (90 Base) MCG/ACT inhaler Inhale 2 puffs into the lungs every 4 (four) hours as needed for wheezing or shortness of breath. 1 Inhaler 11 Rescue  . butalbital-acetaminophen-caffeine (FIORICET, ESGIC) 50-325-40 MG tablet Take 1-2 tablets by mouth every 6 (six) hours as needed. (Patient not taking: Reported on 12/23/2017) 45 tablet 1 Not Taking at Unknown time  . Elastic Bandages & Supports (COMFORT FIT MATERNITY SUPP MED) MISC 1 Device by Does not apply route daily. 1 each 0 Taking  . Elastic Bandages & Supports (COMFORT FIT MATERNITY SUPP SM) MISC Wear as directed. 1 each 0 Taking     Review of Systems  All systems reviewed and negative except as stated in HPI  Physical Exam Blood pressure 134/64, pulse 97, temperature 98.1 F (36.7 C), temperature source Oral, resp. rate 18, height 5\' 4"  (1.626 m), weight 77.8 kg (171 lb 8 oz), last menstrual period 04/11/2017, currently breastfeeding. General appearance: alert, oriented, NAD Lungs: normal respiratory effort Heart: regular rate Abdomen: soft, non-tender; gravid, FH appropriate for GA Extremities: No calf swelling or tenderness Presentation: cephalic by  SVE Fetal monitoring: baseline rate 135, moderate variability, +acel, no decel Uterine activity: irreg ctx/UI Dilation: 1 Effacement (%): 60 Station: -2 Exam by:: jaton burgess rnc  Prenatal labs: ABO, Rh: --/--/O POS (06/03 0750) Antibody: NEG (06/03 0750) Rubella: 1.99 (12/06 1627) RPR: Non Reactive (04/02 1046)  HBsAg: Negative (12/06 1627)  HIV: Non Reactive (04/02 1046)  GC/Chlamydia: negative GBS: Negative (05/20 1623)  2-hr GTT: normal Genetic screening:  negative  NIPS Anatomy US: normal  Prenatal Transfer Tool  Maternal Diabetes: No Genetic Screening: Normal Maternal Ultrasounds/Referrals: Normal; UVV Fetal Ultrasounds or other Referrals:  None Maternal Substance Abuse:  No Significant Maternal Medications:  Meds include: Zoloft Significant Maternal Lab Results: None  Results for orders placed or performed during the hospital encounter of 12/26/17 (from the past 24 hour(s))  CBC   Collection Time: 12/26/17  7:40 AM  Result Value Ref Range   WBC 11.0 (H) 4.0 - 10.5 K/uL   RBC 3.88 3.87 - 5.11 MIL/uL   Hemoglobin 10.3 (L) 12.0 - 15.0 g/dL   HCT 16.131.8 (L) 09.636.0 - 04.546.0 %   MCV 82.0 78.0 - 100.0 fL   MCH 26.5 26.0 - 34.0 pg   MCHC 32.4 30.0 - 36.0 g/dL   RDW 40.914.8 81.111.5 - 91.415.5 %   Platelets 293 150 - 400 K/uL  Type and screen   Collection Time: 12/26/17  7:50 AM  Result Value Ref Range   ABO/RH(D) O POS    Antibody Screen NEG    Sample Expiration      12/29/2017 Performed at [facility], [address] [place], [place] [address]     Patient Active Problem List   Diagnosis Date Noted  . Labor and delivery indication for care or intervention 12/26/2017  . [redacted] weeks gestation of pregnancy   . 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 [redacted] weeks gestation of pregnancy   . Small for gestational age   . Uterine size date discrepancy pregnancy, third trimester   . Umbilical vein abnormality affecting pregnancy   . Vitamin D deficiency  07/05/2017  . Supervision of high-risk pregnancy 06/30/2017  . 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 Anxiety and depression 02/17/2014  . Depression during pregnancy in third trimester 11/29/2013  . INSOMNIA 03/20/2008  . ADHD 08/29/2007  . MIGRAINE, COMMON W/O INTRACTABLE MIGRAINE 03/29/2007    Assessment: Alexandra MendsChristina Meadows is a 22 y.o. G2P1001 at 4922w0d here for IOL for UVV  #Labor: cervical ripening started with cytotec. Discussed FB, will attempt with next cytotec #Pain: Planning on getting epidural #FWB: Cat i #ID:  GBS neg #MOF: breast #MOC:undecided #Circ:  Outpatient   Kandra NicolasJulie P Maruice Pieroni 12/26/2017, 10:18 AM

## 2017-12-26 NOTE — Anesthesia Preprocedure Evaluation (Signed)
Anesthesia Evaluation  Patient identified by MRN, date of birth, ID band Patient awake    Reviewed: Allergy & Precautions, H&P , NPO status , Patient's Chart, lab work & pertinent test results  Airway Mallampati: I  TM Distance: >3 FB Neck ROM: full    Dental no notable dental hx.    Pulmonary    Pulmonary exam normal breath sounds clear to auscultation       Cardiovascular negative cardio ROS Normal cardiovascular exam Rhythm:regular Rate:Normal     Neuro/Psych    GI/Hepatic negative GI ROS, Neg liver ROS,   Endo/Other  negative endocrine ROS  Renal/GU negative Renal ROS  negative genitourinary   Musculoskeletal negative musculoskeletal ROS (+)   Abdominal Normal abdominal exam  (+)   Peds  Hematology   Anesthesia Other Findings   Reproductive/Obstetrics (+) Pregnancy                             Anesthesia Physical Anesthesia Plan  ASA: II  Anesthesia Plan: Epidural   Post-op Pain Management:    Induction:   PONV Risk Score and Plan:   Airway Management Planned:   Additional Equipment:   Intra-op Plan:   Post-operative Plan:   Informed Consent: I have reviewed the patients History and Physical, chart, labs and discussed the procedure including the risks, benefits and alternatives for the proposed anesthesia with the patient or authorized representative who has indicated his/her understanding and acceptance.     Plan Discussed with:   Anesthesia Plan Comments:         Anesthesia Quick Evaluation

## 2017-12-27 ENCOUNTER — Encounter (HOSPITAL_COMMUNITY): Payer: Self-pay

## 2017-12-27 DIAGNOSIS — Z3A37 37 weeks gestation of pregnancy: Secondary | ICD-10-CM

## 2017-12-27 MED ORDER — DIBUCAINE 1 % RE OINT: 1.0000 "application " | TOPICAL_OINTMENT | RECTAL | Status: DC | PRN

## 2017-12-27 MED ORDER — OXYCODONE-ACETAMINOPHEN 5-325 MG PO TABS: 2.0000 | ORAL_TABLET | ORAL | Status: DC | PRN

## 2017-12-27 MED ORDER — DIPHENHYDRAMINE HCL 25 MG PO CAPS: 25.0000 mg | ORAL_CAPSULE | Freq: Four times a day (QID) | ORAL | Status: DC | PRN

## 2017-12-27 MED ORDER — ZOLPIDEM TARTRATE 5 MG PO TABS: 5.0000 mg | ORAL_TABLET | Freq: Every evening | ORAL | Status: DC | PRN

## 2017-12-27 MED ORDER — SERTRALINE HCL 100 MG PO TABS
100.0000 mg | ORAL_TABLET | Freq: Every day | ORAL | Status: DC
  Administered 2017-12-27 – 2017-12-28 (×2): 100 mg via ORAL
  Filled 2017-12-27 (×2): qty 1

## 2017-12-27 MED ORDER — ONDANSETRON HCL 4 MG/2ML IJ SOLN: 4.0000 mg | INTRAMUSCULAR | Status: DC | PRN

## 2017-12-27 MED ORDER — ONDANSETRON HCL 4 MG PO TABS: 4.0000 mg | ORAL_TABLET | ORAL | Status: DC | PRN

## 2017-12-27 MED ORDER — BENZOCAINE-MENTHOL 20-0.5 % EX AERO
1.0000 "application " | INHALATION_SPRAY | CUTANEOUS | Status: DC | PRN
  Filled 2017-12-27: qty 56

## 2017-12-27 MED ORDER — WITCH HAZEL-GLYCERIN EX PADS: 1.0000 "application " | MEDICATED_PAD | CUTANEOUS | Status: DC | PRN

## 2017-12-27 MED ORDER — SIMETHICONE 80 MG PO CHEW: 80.0000 mg | CHEWABLE_TABLET | ORAL | Status: DC | PRN

## 2017-12-27 MED ORDER — SENNOSIDES-DOCUSATE SODIUM 8.6-50 MG PO TABS
2.0000 | ORAL_TABLET | ORAL | Status: DC
  Administered 2017-12-27: 2 via ORAL
  Filled 2017-12-27: qty 2

## 2017-12-27 MED ORDER — PRENATAL MULTIVITAMIN CH
1.0000 | ORAL_TABLET | Freq: Every day | ORAL | Status: DC
  Administered 2017-12-27: 1 via ORAL
  Filled 2017-12-27: qty 1

## 2017-12-27 MED ORDER — ACETAMINOPHEN 325 MG PO TABS
650.0000 mg | ORAL_TABLET | ORAL | Status: DC | PRN
  Administered 2017-12-27 (×2): 650 mg via ORAL
  Filled 2017-12-27 (×2): qty 2

## 2017-12-27 MED ORDER — OXYCODONE-ACETAMINOPHEN 5-325 MG PO TABS
1.0000 | ORAL_TABLET | ORAL | Status: DC | PRN
  Administered 2017-12-27 – 2017-12-28 (×2): 1 via ORAL
  Filled 2017-12-27 (×2): qty 1

## 2017-12-27 MED ORDER — COCONUT OIL OIL: 1.0000 "application " | TOPICAL_OIL | Status: DC | PRN

## 2017-12-27 MED ORDER — IBUPROFEN 600 MG PO TABS
600.0000 mg | ORAL_TABLET | Freq: Four times a day (QID) | ORAL | Status: DC
  Administered 2017-12-27 – 2017-12-28 (×5): 600 mg via ORAL
  Filled 2017-12-27 (×5): qty 1

## 2017-12-27 NOTE — Anesthesia Postprocedure Evaluation (Signed)
Anesthesia Post Note  Patient: Manufacturing systems engineerChristina Ullom  Procedure(s) Performed: AN AD HOC LABOR EPIDURAL     Patient location during evaluation: Mother Baby Anesthesia Type: Epidural Level of consciousness: awake Pain management: pain level controlled Vital Signs Assessment: post-procedure vital signs reviewed and stable Respiratory status: spontaneous breathing Cardiovascular status: stable Postop Assessment: epidural receding and patient able to bend at knees Anesthetic complications: no    Last Vitals:  Vitals:   12/27/17 0529 12/27/17 0926  BP: 121/75 122/64  Pulse: 87 81  Resp: 20   Temp: 36.7 C 36.8 C  SpO2: 98%     Last Pain:  Vitals:   12/27/17 1040  TempSrc:   PainSc: 6    Pain Goal:                 Edison PaceWILKERSON,Sophy Mesler

## 2017-12-27 NOTE — Lactation Note (Signed)
This note was copied from a baby's chart. Lactation Consultation Note  Patient Name: Alexandra Meadows ZOXWR'UToday's Date: 12/27/2017 Reason for consult: Initial assessment;Other (Comment);Early term 1837-38.6wks;Infant < 6lbs(mom sound asleep in bed, dad with  baby on chest asleep. LC woke dad up to inform LC would be placing baby in the crib. for safety )   Maternal Data    Feeding Feeding Type: Bottle Fed - Formula  LATCH Score                   Interventions    Lactation Tools Discussed/Used     Consult Status Consult Status: Follow-up Date: 12/27/17 Follow-up type: In-patient    Alexandra Meadows 12/27/2017, 3:44 PM

## 2017-12-27 NOTE — Lactation Note (Signed)
This note was copied from a baby's chart. Lactation Consultation Note  Patient Name: Alexandra Meadows [id number]'QToday's Date: 12/27/2017 Reason for consult: Initial assessment;Infant < 6lbs;Early term 6037-38.6wks  16 hours old early term baby who's currently being BF by his mother, she's a P2 and experienced BF. She was able to BF her first child for 13 months, but she did face some BF difficulties, her baby had a difficult latch, he was tongue tied.  Baby was asleep when entering the room, had to come back twice and when offered assistance with latch mom declined both times, stating she's feeding her baby a bottle (with her own breastmilk). Mom started to pump since she was [redacted] weeks pregnant and she'll also be bringing that frozen colostrum she has at home to the hospital to feed baby. She has a hand pump at home and already knows how to hand express.  Mom was expressing her milk with a hand pump but RN set up a DEBP and she'll be using that from now on, she's aware she should be doing it every 3 hours. Encouraged mom to feed baby STS 8-12 times/24 hours or sooner if feeding cues are present. She'll also supplement baby with her EBM; mom is mainly pumping and bottle feeding at this point but will call for latch assistance if needed. BF brochure, BF resources and feeding diary were reviewed, mom aware of LC services and will call PRN.  Maternal Data Formula Feeding for Exclusion: No Has patient been taught Hand Expression?: Yes Does the patient have breastfeeding experience prior to this delivery?: Yes  Feeding Feeding Type: Breast Fed Length of feed: 10 min  Interventions Interventions: Breast feeding basics reviewed;DEBP  Lactation Tools Discussed/Used Tools: Pump Breast pump type: Double-Electric Breast Pump WIC Program: No Pump Review: Setup, frequency, and cleaning Initiated by:: RN Date initiated:: 12/27/17   Consult Status Consult Status: Follow-up Date: 12/28/17 Follow-up type:  In-patient    Alexandra Meadows 12/27/2017, 6:15 PM

## 2017-12-27 NOTE — Progress Notes (Signed)
RN entered room, Patient noted in the bathroom, on toilet. Patients mom stated she helped to get patient to the bathroom. Patient attempted to stand up from toilet and legs still partially numb. Assisted back to bed using steady.  Educated patient to call for assistance to get up next time until RN says it is okay to get up alone.  Tylene FantasiaBelinda Quintell Bonnin, RN 12/27/2017 5:58 AM

## 2017-12-27 NOTE — Progress Notes (Signed)
RN rounded on patient and offered assistance to the bathroom.  During shift change report the patient was reminded to call for assistance to the bathroom.  Patient stated that FOB had helped her to the bathroom and that her legs felt much better than last time, and they were no longer tingling.  RN stated that it would be okay for her to walk without the nurse, but if at any point she felt lightheaded or dizzy to call for assistance.

## 2017-12-28 ENCOUNTER — Telehealth: Payer: Self-pay | Admitting: *Deleted

## 2017-12-28 LAB — BIRTH TISSUE RECOVERY COLLECTION (PLACENTA DONATION)

## 2017-12-28 MED ORDER — IBUPROFEN 600 MG PO TABS: 600.0000 mg | ORAL_TABLET | Freq: Four times a day (QID) | ORAL | 0 refills | Status: DC | PRN

## 2017-12-28 MED ORDER — OXYCODONE-ACETAMINOPHEN 5-325 MG PO TABS: 1.0000 | ORAL_TABLET | Freq: Four times a day (QID) | ORAL | 0 refills | Status: DC | PRN

## 2017-12-28 NOTE — Telephone Encounter (Signed)
Pt called to office stating her Rx was not at pharmacy. Pt states that Percocet was to be sent. Pt did not get Rx upon d/c from hospital today. Pt states little-no relief with Ibuprofen, states she "needs this prescription".  Pt made aware that message to be sent to d/c provider for follow up.    Please advise on Rx for Percocet.

## 2017-12-28 NOTE — Telephone Encounter (Signed)
Limited amount of percocet given. Patient had uncomplicated SVD. She was made aware that this is not a routine Rx for discharge.   Alexandra AdaJazma Pearly [place], DO OB Fellow Center for [facility], [facility]

## 2017-12-28 NOTE — Lactation Note (Addendum)
This note was copied from a baby's chart. Lactation Consultation Note  Patient Name: Boy Neta MendsChristina Schroth ZOXWR'UToday's Date: 12/28/2017 Reason for consult: Follow-up assessment;Early term 1937-38.6wks  [facility]. When infant doesn't feed well at breast, Mom pumps & then Dad gives a bottle. Mom says she's not using the nipple shield with feeds. On visual inspection, Mom needs a size 24 flange for pumping, which she has been using.   Parents understand that infant needs to feed q3hrs (37 week infant).   Parents have no questions or concerns. Mom breast fed her 920 month old for 13 months.   Mom takes Zoloft 100mg  qd (L2).   Lurline HareRichey, Linna Thebeau [facility] 12/28/2017, 11:26 AM

## 2017-12-28 NOTE — Discharge Summary (Signed)
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 yo [id number] at [redacted]w[redacted]d was admitted to the hospital 12/26/2017 for induction of labor.  Indication for induction: umbilical vein varix.  Patient had an uncomplicated labor course as follows: Membrane Rupture Time/Date: 8:29 PM ,12/26/2017   Intrapartum Procedures: Episiotomy: None [1]                                         Lacerations:  Labial [10]  Patient had delivery of a Viable infant.  Information for the patient's newborn:  Alexandra Meadows [[id number]]  Delivery Method: Vaginal, Spontaneous(Filed from Delivery Summary)   12/27/2017  Details of delivery can be found in separate delivery note.  Patient had a routine postpartum course.  Reports sore throat this am, and a little congested-discussed relief measures, to call clinic if not improving after 7-10d from onset sx or progressively worsening. Reports she is doing well on zoloft. Patient is discharged home 12/28/17.  Physical exam  Vitals:   12/27/17 0529 12/27/17 0926 12/27/17 1710 12/28/17  0548  BP: 121/75 122/64 121/69 111/62  Pulse: 87 81 69 69  Resp: 20  18   Temp: 98.1 F (36.7 C) 98.2 F (36.8 C) 98.1 F (36.7 C) 98 F (36.7 C)  TempSrc: Oral Oral Oral   SpO2: 98%     Weight:      Height:       General: alert, cooperative and no distress Lochia: appropriate Uterine Fundus: firm Incision: N/A DVT Evaluation: No evidence of DVT seen on physical exam. Negative Homan's sign. No cords or calf tenderness. No significant calf/ankle edema. Labs: Lab Results  Component Value Date   WBC 11.0 (H) 12/26/2017   HGB 10.3 (L) 12/26/2017   HCT 31.8 (L) 12/26/2017   MCV 82.0 12/26/2017   PLT 293 12/26/2017   CMP Latest Ref Rng & Units 09/04/2017  Glucose 65 - 99 mg/dL 87  BUN 6 - 20 mg/dL 7  Creatinine 2.84 - 1.32 mg/dL 4.40(N)  Sodium 027 - 253 mmol/L 136  Potassium 3.5 - 5.1 mmol/L 3.5  Chloride 101 - 111 mmol/L 103  CO2 22 - 32 mmol/L 24  Calcium 8.9 - 10.3 mg/dL 8.1(L)  Total Protein 6.5 - 8.1 g/dL 7.0  Total Bilirubin 0.3 - 1.2 mg/dL 0.8  Alkaline Phos 38 - 126 U/L 58  AST 15 - 41 U/L 48(H)  ALT 14 - 54 U/L 13(L)    Discharge instruction: per After Visit Summary and "Baby and Me Booklet".  After visit meds:  Allergies as of 12/28/2017      Reactions   Bee Venom Hives, Swelling   Tetanus Toxoids Hives   Xanax [alprazolam] Other (See Comments)   SEIZURES      Medication List    STOP taking these medications   acetaminophen 500 MG tablet Commonly known as:  TYLENOL   butalbital-acetaminophen-caffeine 50-325-40 MG tablet Commonly known as:  FIORICET, ESGIC   calcium carbonate 500 MG chewable tablet Commonly known as:  TUMS - dosed in mg elemental calcium   COMFORT FIT MATERNITY SUPP MED Misc   COMFORT FIT MATERNITY SUPP SM Misc   cyclobenzaprine 10 MG tablet Commonly known as:  FLEXERIL   diphenhydrAMINE 25 MG tablet Commonly known as:  BENADRYL   Doxylamine-Pyridoxine 10-10 MG Tbec Commonly known as:  DICLEGIS     TAKE these  medications   albuterol 108 (90 Base) MCG/ACT inhaler Commonly known as:  PROVENTIL HFA;VENTOLIN HFA Inhale 2 puffs into the lungs every 4 (four) hours as needed for wheezing or shortness of breath.   ibuprofen 600 MG tablet Commonly known as:  ADVIL,MOTRIN Take 1 tablet (600 mg total) by mouth every 6 (six) hours as needed for mild pain, moderate pain or cramping.   loratadine 10 MG tablet Commonly known as:  CLARITIN Take 1 tablet (10 mg total) by mouth daily.   PROVIDA DHA 16-16-1.25-110 MG Caps Take 1 tablet by mouth at bedtime.   sertraline 100 MG tablet Commonly known as:  ZOLOFT Take 1 tablet (100 mg total) by mouth daily.       Diet: routine diet  Activity: Advance as tolerated. Pelvic rest for 6 weeks.   Outpatient follow up:2 wks for pp behavioral health/depression check- [facility], then 4-6wks for pp visit Follow up Appt: Future Appointments  Date Time Provider Department Center  01/23/2018  3:30 PM Brock BadHarper, Charles A, MD [facility] None   Follow up Visit:No follow-ups on file.  Postpartum contraception: abstinence until contraception- currently undecided on method, discussed options  Newborn Data: Live born female  Birth Weight: 5 lb 14 oz (2665 g) APGAR: 7, 8  Newborn Delivery   Birth date/time:  12/27/2017 01:45:00 Delivery type:  Vaginal, Spontaneous     Baby Feeding: breast & bottle Disposition:home with mother   12/28/2017 Cheral MarkerKimberly R Camey Edell, CNM
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
## 2017-12-28 NOTE — Discharge Instructions (Signed)
NO SEX UNTIL AFTER YOU GET YOUR BIRTH CONTROL  ° ° °Postpartum Care After Vaginal Delivery °The period of time right after you deliver your newborn is called the postpartum period. °What kind of medical care will I receive? °· You may continue to receive fluids and medicines through an IV tube inserted into one of your veins. °· If an incision was made near your vagina (episiotomy) or if you had some vaginal tearing during delivery, cold compresses may be placed on your episiotomy or your tear. This helps to reduce pain and swelling. °· You may be given a squirt bottle to use when you go to the bathroom. You may use this until you are comfortable wiping as usual. To use the squirt bottle, follow these steps: °? Before you urinate, fill the squirt bottle with warm water. Do not use hot water. °? After you urinate, while you are sitting on the toilet, use the squirt bottle to rinse the area around your urethra and vaginal opening. This rinses away any urine and blood. °? You may do this instead of wiping. As you start healing, you may use the squirt bottle before wiping yourself. Make sure to wipe gently. °? Fill the squirt bottle with clean water every time you use the bathroom. °· You will be given sanitary pads to wear. °How can I expect to feel? °· You may not feel the need to urinate for several hours after delivery. °· You will have some soreness and pain in your abdomen and vagina. °· If you are breastfeeding, you may have uterine contractions every time you breastfeed for up to several weeks postpartum. Uterine contractions help your uterus return to its normal size. °· It is normal to have vaginal bleeding (lochia) after delivery. The amount and appearance of lochia is often similar to a menstrual period in the first week after delivery. It will gradually decrease over the next few weeks to a dry, yellow-brown discharge. For most women, lochia stops completely by 6-8 weeks after delivery. Vaginal bleeding can  vary from woman to woman. °· Within the first few days after delivery, you may have breast engorgement. This is when your breasts feel heavy, full, and uncomfortable. Your breasts may also throb and feel hard, tightly stretched, warm, and tender. After this occurs, you may have milk leaking from your breasts. Your health care provider can help you relieve discomfort due to breast engorgement. Breast engorgement should go away within a few days. °· You may feel more sad or worried than normal due to hormonal changes after delivery. These feelings should not last more than a few days. If these feelings do not go away after several days, speak with your health care provider. °How should I care for myself? °· Tell your health care provider if you have pain or discomfort. °· Drink enough water to keep your urine clear or pale yellow. °· Wash your hands thoroughly with soap and water for at least 20 seconds after changing your sanitary pads, after using the toilet, and before holding or feeding your baby. °· If you are not breastfeeding, avoid touching your breasts a lot. Doing this can make your breasts produce more milk. °· If you become weak or lightheaded, or you feel like you might faint, ask for help before: °? Getting out of bed. °? Showering. °· Change your sanitary pads frequently. Watch for any changes in your flow, such as a sudden increase in volume, a change in color, the passing of large blood   clots. If you pass a blood clot from your vagina, save it to show to your health care provider. Do not flush blood clots down the toilet without having your health care provider look at them. °· Make sure that all your vaccinations are up to date. This can help protect you and your baby from getting certain diseases. You may need to have immunizations done before you leave the hospital. °· If desired, talk with your health care provider about methods of family planning or birth control (contraception). °How can I start  bonding with my baby? °Spending as much time as possible with your baby is very important. During this time, you and your baby can get to know each other and develop a bond. Having your baby stay with you in your room (rooming in) can give you time to get to know your baby. Rooming in can also help you become comfortable caring for your baby. Breastfeeding can also help you bond with your baby. °How can I plan for returning home with my baby? °· Make sure that you have a car seat installed in your vehicle. °? Your car seat should be checked by a certified car seat installer to make sure that it is installed safely. °? Make sure that your baby fits into the car seat safely. °· Ask your health care provider any questions you have about caring for yourself or your baby. Make sure that you are able to contact your health care provider with any questions after leaving the hospital. °This information is not intended to replace advice given to you by your health care provider. Make sure you discuss any questions you have with your health care provider. °Document Released: 05/09/2007 Document Revised: 12/15/2015 Document Reviewed: 06/16/2015 °[employer] Interactive Patient Education © 2018 [employer] ° ° °Breastfeeding °Choosing to breastfeed is one of the best decisions you can make for yourself and your baby. A change in hormones during pregnancy causes your breasts to make breast milk in your milk-producing glands. Hormones prevent breast milk from being released before your baby is born. They also prompt milk flow after birth. Once breastfeeding has begun, thoughts of your baby, as well as his or her sucking or crying, can stimulate the release of milk from your milk-producing glands. °Benefits of breastfeeding °Research shows that breastfeeding offers many health benefits for infants and mothers. It also offers a cost-free and convenient way to feed your baby. °For your baby °· Your first milk (colostrum) helps your  baby's digestive system to function better. °· Special cells in your milk (antibodies) help your baby to fight off infections. °· Breastfed babies are less likely to develop asthma, allergies, obesity, or type 2 diabetes. They are also at lower risk for sudden infant death syndrome (SIDS). °· Nutrients in breast milk are better able to meet your baby’s needs compared to infant formula. °· Breast milk improves your baby's brain development. °For you °· Breastfeeding helps to create a very special bond between you and your baby. °· Breastfeeding is convenient. Breast milk costs nothing and is always available at the correct temperature. °· Breastfeeding helps to burn calories. It helps you to lose the weight that you gained during pregnancy. °· Breastfeeding makes your uterus return faster to its size before pregnancy. It also slows bleeding (lochia) after you give birth. °· Breastfeeding helps to lower your risk of developing type 2 diabetes, osteoporosis, rheumatoid arthritis, cardiovascular disease, and breast, ovarian, uterine, and endometrial cancer later in life. °Breastfeeding basics °  Starting breastfeeding °· Find a comfortable place to sit or lie down, with your neck and back well-supported. °· Place a pillow or a rolled-up blanket under your baby to bring him or her to the level of your breast (if you are seated). Nursing pillows are specially designed to help support your arms and your baby while you breastfeed. °· Make sure that your baby's tummy (abdomen) is facing your abdomen. °· Gently massage your breast. With your fingertips, massage from the outer edges of your breast inward toward the nipple. This encourages milk flow. If your milk flows slowly, you may need to continue this action during the feeding. °· Support your breast with 4 fingers underneath and your thumb above your nipple (make the letter "C" with your hand). Make sure your fingers are well away from your nipple and your baby’s  mouth. °· Stroke your baby's lips gently with your finger or nipple. °· When your baby's mouth is open wide enough, quickly bring your baby to your breast, placing your entire nipple and as much of the areola as possible into your baby's mouth. The areola is the colored area around your nipple. °? More areola should be visible above your baby's upper lip than below the lower lip. °? Your baby's lips should be opened and extended outward (flanged) to ensure an adequate, comfortable latch. °? Your baby's tongue should be between his or her lower gum and your breast. °· Make sure that your baby's mouth is correctly positioned around your nipple (latched). Your baby's lips should create a seal on your breast and be turned out (everted). °· It is common for your baby to suck about 2-3 minutes in order to start the flow of breast milk. °Latching °Teaching your baby how to latch onto your breast properly is very important. An improper latch can cause nipple pain, decreased milk supply, and poor weight gain in your baby. Also, if your baby is not latched onto your nipple properly, he or she may swallow some air during feeding. This can make your baby fussy. Burping your baby when you switch breasts during the feeding can help to get rid of the air. However, teaching your baby to latch on properly is still the best way to prevent fussiness from swallowing air while breastfeeding. °Signs that your baby has successfully latched onto your nipple °· Silent tugging or silent sucking, without causing you pain. Infant's lips should be extended outward (flanged). °· Swallowing heard between every 3-4 sucks once your milk has started to flow (after your let-down milk reflex occurs). °· Muscle movement above and in front of his or her ears while sucking. ° °Signs that your baby has not successfully latched onto your nipple °· Sucking sounds or smacking sounds from your baby while breastfeeding. °· Nipple pain. ° °If you think your  baby has not latched on correctly, slip your finger into the corner of your baby’s mouth to break the suction and place it between your baby's gums. Attempt to start breastfeeding again. °Signs of successful breastfeeding °Signs from your baby °· Your baby will gradually decrease the number of sucks or will completely stop sucking. °· Your baby will fall asleep. °· Your baby's body will relax. °· Your baby will retain a small amount of milk in his or her mouth. °· Your baby will let go of your breast by himself or herself. ° °Signs from you °· Breasts that have increased in firmness, weight, and size 1-3 hours after feeding. °·   Breasts that are softer immediately after breastfeeding. °· Increased milk volume, as well as a change in milk consistency and color by the fifth day of breastfeeding. °· Nipples that are not sore, cracked, or bleeding. ° °Signs that your baby is getting enough milk °· Wetting at least 1-2 diapers during the first 24 hours after birth. °· Wetting at least 5-6 diapers every 24 hours for the first week after birth. The urine should be clear or pale yellow by the age of 5 days. °· Wetting 6-8 diapers every 24 hours as your baby continues to grow and develop. °· At least 3 stools in a 24-hour period by the age of 5 days. The stool should be soft and yellow. °· At least 3 stools in a 24-hour period by the age of 7 days. The stool should be seedy and yellow. °· No loss of weight greater than 10% of birth weight during the first 3 days of life. °· Average weight gain of 4-7 oz (113-198 g) per week after the age of 4 days. °· Consistent daily weight gain by the age of 5 days, without weight loss after the age of 2 weeks. °After a feeding, your baby may spit up a small amount of milk. This is normal. °Breastfeeding frequency and duration °Frequent feeding will help you make more milk and can prevent sore nipples and extremely full breasts (breast engorgement). Breastfeed when you feel the need to  reduce the fullness of your breasts or when your baby shows signs of hunger. This is called "breastfeeding on demand." Signs that your baby is hungry include: °· Increased alertness, activity, or restlessness. °· Movement of the head from side to side. °· Opening of the mouth when the corner of the mouth or cheek is stroked (rooting). °· Increased sucking sounds, smacking lips, cooing, sighing, or squeaking. °· Hand-to-mouth movements and sucking on fingers or hands. °· Fussing or crying. ° °Avoid introducing a pacifier to your baby in the first 4-6 weeks after your baby is born. After this time, you may choose to use a pacifier. Research has shown that pacifier use during the first year of a baby's life decreases the risk of sudden infant death syndrome (SIDS). °Allow your baby to feed on each breast as long as he or she wants. When your baby unlatches or falls asleep while feeding from the first breast, offer the second breast. Because newborns are often sleepy in the first few weeks of life, you may need to awaken your baby to get him or her to feed. °Breastfeeding times will vary from baby to baby. However, the following rules can serve as a guide to help you make sure that your baby is properly fed: °· Newborns (babies 4 weeks of age or younger) may breastfeed every 1-3 hours. °· Newborns should not go without breastfeeding for longer than 3 hours during the day or 5 hours during the night. °· You should breastfeed your baby a minimum of 8 times in a 24-hour period. ° °Breast milk pumping °Pumping and storing breast milk allows you to make sure that your baby is exclusively fed your breast milk, even at times when you are unable to breastfeed. This is especially important if you go back to work while you are still breastfeeding, or if you are not able to be present during feedings. Your lactation consultant can help you find a method of pumping that works best for you and give you guidelines about how long it  is   safe to store breast milk. °Caring for your breasts while you breastfeed °Nipples can become dry, cracked, and sore while breastfeeding. The following recommendations can help keep your breasts moisturized and healthy: °· Avoid using soap on your nipples. °· Wear a supportive bra designed especially for nursing. Avoid wearing underwire-style bras or extremely tight bras (sports bras). °· Air-dry your nipples for 3-4 minutes after each feeding. °· Use only cotton bra pads to absorb leaked breast milk. Leaking of breast milk between feedings is normal. °· Use lanolin on your nipples after breastfeeding. Lanolin helps to maintain your skin's normal moisture barrier. Pure lanolin is not harmful (not toxic) to your baby. You may also hand express a few drops of breast milk and gently massage that milk into your nipples and allow the milk to air-dry. ° °In the first few weeks after giving birth, some women experience breast engorgement. Engorgement can make your breasts feel heavy, warm, and tender to the touch. Engorgement peaks within 3-5 days after you give birth. The following recommendations can help to ease engorgement: °· Completely empty your breasts while breastfeeding or pumping. You may want to start by applying warm, moist heat (in the shower or with warm, water-soaked hand towels) just before feeding or pumping. This increases circulation and helps the milk flow. If your baby does not completely empty your breasts while breastfeeding, pump any extra milk after he or she is finished. °· Apply ice packs to your breasts immediately after breastfeeding or pumping, unless this is too uncomfortable for you. To do this: °? Put ice in a plastic bag. °? Place a towel between your skin and the bag. °? Leave the ice on for 20 minutes, 2-3 times a day. °· Make sure that your baby is latched on and positioned properly while breastfeeding. ° °If engorgement persists after 48 hours of following these recommendations,  contact your health care provider or a lactation consultant. °Overall health care recommendations while breastfeeding °· Eat 3 healthy meals and 3 snacks every day. Well-nourished mothers who are breastfeeding need an additional 450-500 calories a day. You can meet this requirement by increasing the amount of a balanced diet that you eat. °· Drink enough water to keep your urine pale yellow or clear. °· Rest often, relax, and continue to take your prenatal vitamins to prevent fatigue, stress, and low vitamin and mineral levels in your body (nutrient deficiencies). °· Do not use any products that contain nicotine or tobacco, such as cigarettes and e-cigarettes. Your baby may be harmed by chemicals from cigarettes that pass into breast milk and exposure to secondhand smoke. If you need help quitting, ask your health care provider. °· Avoid alcohol. °· Do not use illegal drugs or marijuana. °· Talk with your health care provider before taking any medicines. These include over-the-counter and prescription medicines as well as vitamins and herbal supplements. Some medicines that may be harmful to your baby can pass through breast milk. °· It is possible to become pregnant while breastfeeding. If birth control is desired, ask your health care provider about options that will be safe while breastfeeding your baby. °Where to find more information: °[employer]: www.llli.org °Contact a health care provider if: °· You feel like you want to stop breastfeeding or have become frustrated with breastfeeding. °· Your nipples are cracked or bleeding. °· Your breasts are red, tender, or warm. °· You have: °? Painful breasts or nipples. °? A swollen area on either breast. °? A fever   or chills. °? Nausea or vomiting. °? Drainage other than breast milk from your nipples. °· Your breasts do not become full before feedings by the fifth day after you give birth. °· You feel sad and depressed. °· Your baby is: °? Too  sleepy to eat well. °? Having trouble sleeping. °? More than 1 week old and wetting fewer than 6 diapers in a 24-hour period. °? Not gaining weight by 5 days of age. °· Your baby has fewer than 3 stools in a 24-hour period. °· Your baby's skin or the white parts of his or her eyes become yellow. °Get help right away if: °· Your baby is overly tired (lethargic) and does not want to wake up and feed. °· Your baby develops an unexplained fever. °Summary °· Breastfeeding offers many health benefits for infant and mothers. °· Try to breastfeed your infant when he or she shows early signs of hunger. °· Gently tickle or stroke your baby's lips with your finger or nipple to allow the baby to open his or her mouth. Bring the baby to your breast. Make sure that much of the areola is in your baby's mouth. Offer one side and burp the baby before you offer the other side. °· Talk with your health care provider or lactation consultant if you have questions or you face problems as you breastfeed. °This information is not intended to replace advice given to you by your health care provider. Make sure you discuss any questions you have with your health care provider. °Document Released: 07/12/2005 Document Revised: 08/13/2016 Document Reviewed: 08/13/2016 °[employer] Interactive Patient Education © 2018 [employer] ° ° °

## 2018-01-18 ENCOUNTER — Emergency Department (HOSPITAL_COMMUNITY)
Admission: EM | Admit: 2018-01-18 | Discharge: 2018-01-18 | Disposition: A | Discharge status: Home / Self Care | Payer: BLUE CROSS/BLUE SHIELD | Attending: Emergency Medicine | Admitting: Emergency Medicine

## 2018-01-18 ENCOUNTER — Encounter (HOSPITAL_COMMUNITY): Payer: Self-pay

## 2018-01-18 ENCOUNTER — Emergency Department (HOSPITAL_COMMUNITY): Payer: BLUE CROSS/BLUE SHIELD

## 2018-01-18 DIAGNOSIS — J302 Other seasonal allergic rhinitis: Secondary | ICD-10-CM | POA: Insufficient documentation

## 2018-01-18 DIAGNOSIS — Y929 Unspecified place or not applicable: Secondary | ICD-10-CM | POA: Insufficient documentation

## 2018-01-18 DIAGNOSIS — W109XXA Fall (on) (from) unspecified stairs and steps, initial encounter: Secondary | ICD-10-CM | POA: Insufficient documentation

## 2018-01-18 DIAGNOSIS — Y999 Unspecified external cause status: Secondary | ICD-10-CM | POA: Insufficient documentation

## 2018-01-18 DIAGNOSIS — S93402A Sprain of unspecified ligament of left ankle, initial encounter: Secondary | ICD-10-CM | POA: Diagnosis present

## 2018-01-18 DIAGNOSIS — J45909 Unspecified asthma, uncomplicated: Secondary | ICD-10-CM | POA: Insufficient documentation

## 2018-01-18 DIAGNOSIS — Y9301 Activity, walking, marching and hiking: Secondary | ICD-10-CM | POA: Diagnosis not present

## 2018-01-18 DIAGNOSIS — Z79899 Other long term (current) drug therapy: Secondary | ICD-10-CM | POA: Diagnosis not present

## 2018-01-18 NOTE — ED Notes (Signed)
Ortho tech paged  

## 2018-01-18 NOTE — ED Provider Notes (Signed)
[facility] EMERGENCY DEPARTMENT Provider Note   CSN: [id number] Arrival date & time: 01/18/18  1113     History   Chief Complaint No chief complaint on file.   HPI Alexandra Meadows is a 22 y.o. female.  HPI  Patient is a 23 year old female with a history of asthma, anxiety, depression, and recent vaginal delivery 3 weeks ago presenting for left ankle injury.  Patient reports that she was walking out to her car to prepare to go to a an appointment when she tripped on the steps, rolled, and her ankle fell underneath her.  Patient reports she fell onto her left side, and her head did contact the ground, but denies any head trauma, head pain, or neck pain at this time.  Patient reports that she was unable to walk on her ankle after the incident due to the pain.  Patient reports some decreased sensation in all toes, but no significant ecchymosis occurred after the incident.   Past Medical History:  Diagnosis Date  . Anemia   . Anxiety   . Asthma    prn inhaler; exercise induced  . Depression    severe at 20weeks  . 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 Infection    BV with preg; recurrent yeast inf  . Migraines   . Seasonal allergies   . Tonsillar hypertrophy 04/2016    Patient Active Problem List   Diagnosis Date Noted  . SVD (spontaneous vaginal delivery) 12/27/2017  . Labor and delivery indication for care or intervention 12/26/2017  . [redacted] weeks gestation of pregnancy   . 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 [redacted] weeks gestation of pregnancy   . Small for gestational age   . Uterine size date discrepancy pregnancy, third trimester   . Umbilical vein abnormality affecting pregnancy   . Vitamin D deficiency 07/05/2017  . Supervision of high-risk pregnancy 06/30/2017  . 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 Anxiety and depression 02/17/2014  . Depression during pregnancy in third trimester  11/29/2013  . INSOMNIA 03/20/2008  . ADHD 08/29/2007  . MIGRAINE, COMMON W/O INTRACTABLE MIGRAINE 03/29/2007    Past Surgical History:  Procedure Laterality Date  . KNEE ARTHROSCOPY Left 02/28/2014   Procedure: ARTHROSCOPY LEFT KNEE WITH SYNOVECTOMY LIMITED, PARTIAL LATERAL MENISECTOMY;  Surgeon: Loreta Ave, MD;  Location: [facility];  Service: Orthopedics;  Laterality: Left;  . TONSILLECTOMY AND ADENOIDECTOMY Bilateral 05/25/2016   Procedure: TONSILLECTOMY AND ADENOIDECTOMY;  Surgeon: Newman Pies, MD;  Location: [facility];  Service: ENT;  Laterality: Bilateral;  . WISDOM TOOTH EXTRACTION  2015     OB History    Gravida  2   Para  2   Term  2   Preterm      AB      Living  2     SAB      TAB      Ectopic      Multiple  0   Live Births  2            Home Medications    Prior to Admission medications   Medication Sig Start Date End Date Taking? Authorizing Provider  albuterol (PROVENTIL HFA;VENTOLIN HFA) 108 (90 Base) MCG/ACT inhaler Inhale 2 puffs into the lungs every 4 (four) hours as needed for wheezing or shortness of breath. 11/15/17   Orvilla Cornwall A, CNM  ibuprofen (ADVIL,MOTRIN) 600 MG  tablet Take 1 tablet (600 mg total) by mouth every 6 (six) hours as needed for mild pain, moderate pain or cramping. 12/28/17   Cheral MarkerBooker, Kimberly R, CNM  loratadine (CLARITIN) 10 MG tablet Take 1 tablet (10 mg total) by mouth daily. 10/25/17   Brock BadHarper, Charles A, MD  oxyCODONE-acetaminophen (PERCOCET/ROXICET) 5-325 MG tablet Take 1 tablet by mouth every 6 (six) hours as needed for severe pain. 12/28/17   Pincus LargePhelps, Jazma Y, DO  Prenat-FeFum-FePo-FA-DHA w/o A (PROVIDA DHA) 16-16-1.25-110 MG CAPS Take 1 tablet by mouth at bedtime. 09/28/17   Orvilla Cornwallenney, Rachelle A, CNM  sertraline (ZOLOFT) 100 MG tablet Take 1 tablet (100 mg total) by mouth daily. 08/31/17   Roe Coombsenney, Rachelle A, CNM    Family History Family History  Problem Relation Age of Onset  . Diabetes  Mother   . Cancer Maternal Grandfather     Social History Social History   Tobacco Use  . Smoking status: Never Smoker  . Smokeless tobacco: Never Used  . Tobacco comment: quit  Substance Use Topics  . Alcohol use: No    Alcohol/week: 0.0 oz  . Drug use: No     Allergies   Bee venom; Tetanus toxoids; and Xanax [alprazolam]   Review of Systems Review of Systems  Musculoskeletal: Positive for arthralgias and joint swelling.  Skin: Negative for wound.  Neurological: Negative for weakness.       +decreased sensation to light touch in great toe.     Physical Exam Updated Vital Signs BP 105/69 (BP Location: Left Arm)   Pulse 78   Temp 98.9 F (37.2 C) (Oral)   Resp 16   Ht 5\' 4"  (1.626 m)   SpO2 99%   BMI 29.44 kg/m   Physical Exam  Constitutional: She appears well-developed and well-nourished. No distress.  Sitting comfortably in bed.  HENT:  Head: Normocephalic and atraumatic.  Eyes: Conjunctivae are normal. Right eye exhibits no discharge. Left eye exhibits no discharge.  EOMs normal to gross examination.  Neck: Normal range of motion.  Cardiovascular: Normal rate and regular rhythm.  Intact, 2+ DP pulse of LLE.  Pulmonary/Chest:  Normal respiratory effort. Patient converses comfortably. No audible wheeze or stridor  Abdominal: She exhibits no distension.  Musculoskeletal:  Left ankle with tenderness to palpation of lateral malleolus. Minor soft tissue swelling. ROM decreased with flexion/extension due to pain. Patient unable to attempt inversion/everion. No erythema, ecchymosis, or deformity appreciated. No break in skin. No pain to fifth metatarsal area or navicular region. Achilles intact per Thompson's test. Good pedal pulse and cap refill of toes. Sensation intact to light touch distally, but patient reporting decreased sensation in the left great toe.  Neurological: She is alert.  Cranial nerves intact to gross observation. Patient moves extremities  without difficulty.  Skin: Skin is warm and dry. She is not diaphoretic.  Psychiatric: She has a normal mood and affect. Her behavior is normal. Judgment and thought content normal.  Nursing note and vitals reviewed.    ED Treatments / Results  Labs (all labs ordered are listed, but only abnormal results are displayed) Labs Reviewed - No data to display  EKG None  Radiology Dg Ankle Complete Left  Result Date: 01/18/2018 CLINICAL DATA:  Slip and fall several hours ago with persistent lateral ankle pain, initial encounter EXAM: LEFT ANKLE COMPLETE - 3+ VIEW COMPARISON:  None. FINDINGS: There is no evidence of fracture, dislocation, or joint effusion. There is no evidence of arthropathy or other focal bone abnormality.  Soft tissues are unremarkable. IMPRESSION: No acute abnormality noted. Electronically Signed   By: Alcide Clever M.D.   On: 01/18/2018 12:14   Dg Foot Complete Left  Result Date: 01/18/2018 CLINICAL DATA:  Left lateral foot pain with movement after slipping and falling down ports steps this morning. EXAM: LEFT FOOT - COMPLETE 3+ VIEW COMPARISON:  Left ankle series of today's date FINDINGS: The bones are subjectively adequately mineralized. There is no acute fracture nor dislocation. Specific attention to the fifth metatarsal reveals no acute abnormality. The soft tissues are unremarkable. IMPRESSION: There is no acute or significant chronic bony abnormality of the left foot. Electronically Signed   By: David  [place] M.D.   On: 01/18/2018 12:15    Procedures Procedures (including critical care time)  Medications Ordered in ED Medications - No data to display   Initial Impression / Assessment and Plan / ED Course  I have reviewed the triage vital signs and the nursing notes.  Pertinent labs & imaging results that were available during my care of the patient were reviewed by me and considered in my medical decision making (see chart for details).     Patient is  well-appearing and neurovascularly intact in the left lower extremity.  Patient with mild amount of tissue swelling over the lateral malleolus suggestive of anterior talofibular ligament sprain.  X-rays without acute abnormality, patient has no metatarsal tenderness suggesting of further injury.  Patient placed in ASO splint, and instructed on weightbearing as tolerated procedure with crutches.  Given that patient has a very young infant as well as a toddler, I stressed the importance with the patient of having good support and staying with her parents while she is recovering immediately from his ankle sprain.  I also stressed the importance of avoiding NSAIDs while breast-feeding and just strictly taking Tylenol for pain.  Patient is given orthopedic follow-up if not improving in 5 to 7 days.  Return precautions were given for any persistent or worsening sensory deficits, increase in swelling, pain or pallor.  Patient is in understanding and agrees with plan of care.  Final Clinical Impressions(s) / ED Diagnoses   Final diagnoses:  Sprain of left ankle, unspecified ligament, initial encounter    ED Discharge Orders    None       Delia Chimes 01/18/18 1407    Mancel Bale, MD 01/19/18 0930

## 2018-01-18 NOTE — ED Triage Notes (Addendum)
Patient slipped and fell down porch steps this am. No loc, complains of left ankle and foot pain with ROM, no obvious deformity. No swelling noted-

## 2018-01-18 NOTE — ED Notes (Signed)
Ortho tech at bedside 

## 2018-01-18 NOTE — ED Notes (Signed)
Ortho returned page. Will place splint and crutches.

## 2018-01-18 NOTE — Progress Notes (Signed)
Orthopedic Tech Progress Note Patient Details:  Neta MendsChristina Specht 05/08/1996 [id number]  Ortho Devices Type of Ortho Device: ASO, Crutches Ortho Device/Splint Location: lle Ortho Device/Splint Interventions: Application   Post Interventions Patient Tolerated: Well Instructions Provided: Care of device   [name] DomCrawford, Stacyann Mcconaughy 01/18/2018, 1:39 PM

## 2018-01-18 NOTE — Discharge Instructions (Signed)
Please see the information and instructions below regarding your visit.  Your diagnoses today include:  1. Sprain of left ankle, unspecified ligament, initial encounter    Your provider has diagnosed you as suffering from an ankle sprain. Ankle sprain occurs when the ligaments that hold the ankle joint together are stretched or torn. It may take 4 to 6 weeks to heal.  Tests performed today include: An x-ray of your ankle - does NOT show any broken bones  See side panel of your discharge paperwork for testing performed today. Vital signs are listed at the bottom of these instructions.   Medications prescribed:  Take any prescribed medications only as prescribed, and any over the counter medications only as directed on the packaging.  Do not take ibuprofen or Naproxen while breastfeeding. You may take Tylenol.  Home care instructions:  Follow R.I.C.E. Protocol: R - rest your injury  I  - use ice on injury without applying directly to skin C - compress injury with bandage or splint E - elevate the injury as much as possible  For Activity: Wear ankle brace for at least 2 weeks for stabilization of ankle. If prescribed crutches, use crutches with non-weight bearing for the first few days. Then, you may walk on your ankle as the pain allows, or as instructed. Start gradually with weight bearing on the affected ankle. Once you can walk pain free, then try jogging. When you can run forwards, then you can try moving side-to-side. If you cannot walk without crutches in one week, you need a re-check.  Please follow any educational materials contained in this packet.   Follow-up instructions:  Please follow-up with your primary care provider or the provided orthopedic (bone specialist) listed in this packet if you continue to have significant pain or trouble walking in 1 week. In this case you may have a severe sprain that requires further care.   Return instructions:  Please return if your toes  are numb or tingling, appear gray or blue, are much colder than your other foot, or you have severe pain (also elevate leg and loosen splint or wrap). Please return to the Emergency Department if you experience worsening symptoms.  Please return if you have any other emergent concerns.  Additional Information:  Your vital signs today were: BP 122/88 (BP Location: Right Arm)    Pulse 94    Temp 98.9 F (37.2 C) (Oral)    Resp 18    Ht 5\' 4"  (1.626 m)    SpO2 100%    BMI 29.44 kg/m  If your blood pressure (BP) was elevated on multiple readings during this visit above 130 for the top number or above 80 for the bottom number, please have this repeated by your primary care provider within one month. --------------  Thank you for allowing us to participate in your care today.

## 2018-01-21 ENCOUNTER — Inpatient Hospital Stay (HOSPITAL_COMMUNITY): Payer: BLUE CROSS/BLUE SHIELD

## 2018-01-21 ENCOUNTER — Inpatient Hospital Stay (HOSPITAL_COMMUNITY)
Admission: AD | Admit: 2018-01-21 | Discharge: 2018-01-21 | Disposition: A | Discharge status: Home / Self Care | Payer: BLUE CROSS/BLUE SHIELD | Source: Ambulatory Visit | Attending: Obstetrics and Gynecology | Admitting: Obstetrics and Gynecology

## 2018-01-21 ENCOUNTER — Encounter (HOSPITAL_COMMUNITY): Payer: Self-pay

## 2018-01-21 DIAGNOSIS — N938 Other specified abnormal uterine and vaginal bleeding: Secondary | ICD-10-CM

## 2018-01-21 DIAGNOSIS — N939 Abnormal uterine and vaginal bleeding, unspecified: Secondary | ICD-10-CM | POA: Diagnosis not present

## 2018-01-21 DIAGNOSIS — R109 Unspecified abdominal pain: Secondary | ICD-10-CM | POA: Insufficient documentation

## 2018-01-21 LAB — CBC
HEMATOCRIT: 36.7 % (ref 36.0–46.0)
HEMOGLOBIN: 11.6 g/dL — AB (ref 12.0–15.0)
MCH: 25.7 pg — ABNORMAL LOW (ref 26.0–34.0)
MCHC: 31.6 g/dL (ref 30.0–36.0)
MCV: 81.4 fL (ref 78.0–100.0)
Platelets: 297 10*3/uL (ref 150–400)
RBC: 4.51 MIL/uL (ref 3.87–5.11)
RDW: 15 % (ref 11.5–15.5)
WBC: 10.6 10*3/uL — ABNORMAL HIGH (ref 4.0–10.5)

## 2018-01-21 MED ORDER — LIDOCAINE HCL (PF) 1 % IJ SOLN
INTRAMUSCULAR | Status: AC
  Filled 2018-01-21: qty 30

## 2018-01-21 MED ORDER — METHYLERGONOVINE MALEATE 0.2 MG/ML IJ SOLN
0.2000 mg | Freq: Once | INTRAMUSCULAR | Status: AC
  Administered 2018-01-21: 0.2 mg via INTRAMUSCULAR
  Filled 2018-01-21: qty 1

## 2018-01-21 NOTE — MAU Provider Note (Signed)
History     CSN: [id number]  Arrival date and time: 01/21/18 1117   First Provider Initiated Contact with Patient 01/21/18 1255      Chief Complaint  Patient presents with  . Abdominal Pain  . Vaginal Bleeding  . Vaginal Discharge   HPI  [name] is a 22 y.o. year old 552P2002 female s/p SVD 3 wks ago who presents to MAU reporting she had a SVD on 12/27/17. She states that her VB had "been ok, but started getting heavier with small clots last night". She reports that the VB is even heavier and tangerine sized clot passed this AM. She reports changing saturated pads every 2 hrs. She also reports a bloody mucous discharge. She denies any recent SI or any increased physical activity. She states, "I've only been laying around the past 2 days with my 2 sick children.   Past Medical History:  Diagnosis Date  . Anemia   . Anxiety   . Asthma    prn inhaler; exercise induced  . Depression    severe at 20weeks  . 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 Infection    BV with preg; recurrent yeast inf  . Migraines   . Seasonal allergies   . Tonsillar hypertrophy 04/2016    Past Surgical History:  Procedure Laterality Date  . KNEE ARTHROSCOPY Left 02/28/2014   Procedure: ARTHROSCOPY LEFT KNEE WITH SYNOVECTOMY LIMITED, PARTIAL LATERAL MENISECTOMY;  Surgeon: Alexandra Aveaniel F Murphy, MD;  Location: [facility];  Service: Orthopedics;  Laterality: Left;  . TONSILLECTOMY AND ADENOIDECTOMY Bilateral 05/25/2016   Procedure: TONSILLECTOMY AND ADENOIDECTOMY;  Surgeon: Alexandra PiesSu Teoh, MD;  Location: [facility];  Service: ENT;  Laterality: Bilateral;  . WISDOM TOOTH EXTRACTION  2015    Family History  Problem Relation Age of Onset  . Diabetes Mother   . Cancer Maternal Grandfather     Social History   Tobacco Use  . Smoking status: Never Smoker  . Smokeless tobacco: Never Used  . Tobacco comment: quit  Substance Use Topics  .  Alcohol use: Yes    Alcohol/week: 0.0 oz    Comment: occ  . Drug use: No    Allergies:  Allergies  Allergen Reactions  . Bee Venom Hives and Swelling  . Tetanus Toxoids Hives  . Xanax [Alprazolam] Other (See Comments)    SEIZURES    No medications prior to admission.    Review of Systems  Constitutional: Negative.   HENT: Negative.   Eyes: Negative.   Respiratory: Negative.   Cardiovascular: Negative.   Gastrointestinal: Negative.   Endocrine: Negative.   Genitourinary: Positive for vaginal bleeding (with clots since yesterday).  Musculoskeletal: Negative.   Skin: Negative.   Allergic/Immunologic: Negative.   Neurological: Negative.   Hematological: Negative.   Psychiatric/Behavioral: Negative.    Physical Exam   Patient Vitals for the past 24 hrs:  BP Temp Temp src Pulse Resp Weight  01/21/18 1553 126/65 - - 91 18 -  01/21/18 1140 120/74 98.4 F (36.9 C) Oral 84 18 -  01/21/18 1135 - - - - - 153 lb 11.2 oz (69.7 kg)    Physical Exam  Nursing note and vitals reviewed. Constitutional: She is oriented to person, place, and time. She appears well-developed and well-nourished.  HENT:  Head: Normocephalic.  Eyes: Pupils are equal, round, and reactive to light.  Neck: Normal range of motion.  Cardiovascular: Normal  rate.  Respiratory: Effort normal.  GI: Soft.  Genitourinary:  Genitourinary Comments: Large mucoid blood clot removed from introitus, moderate amount of dark, red, mucoid blood in vaginal vault and coming from cervical opening.  Musculoskeletal: Normal range of motion.  Neurological: She is alert and oriented to person, place, and time.  Skin: Skin is warm and dry.  Psychiatric: She has a normal mood and affect. Her behavior is normal. Judgment and thought content normal.    MAU Course  Procedures  MDM CBC Orthostatic VS Pelvis Complete Non-OB U/S Methergine 0.2 mg IM injection -- slowed bleeding "a lot"  Results for orders placed or  performed during the hospital encounter of 01/21/18 (from the past 24 hour(s))  CBC     Status: Abnormal   Collection Time: 01/21/18  1:21 PM  Result Value Ref Range   WBC 10.6 (H) 4.0 - 10.5 K/uL   RBC 4.51 3.87 - 5.11 MIL/uL   Hemoglobin 11.6 (L) 12.0 - 15.0 g/dL   HCT 95.6 21.3 - 08.6 %   MCV 81.4 78.0 - 100.0 fL   MCH 25.7 (L) 26.0 - 34.0 pg   MCHC 31.6 30.0 - 36.0 g/dL   RDW 57.8 46.9 - 62.9 %   Platelets 297 150 - 400 K/uL      U/S Pelvis Transvanginal Non-ob (tv Only)  Result Date: 01/21/2018 CLINICAL DATA:  Postpartum bleeding. EXAM: ULTRASOUND PELVIS TRANSVAGINAL TECHNIQUE: Transvaginal ultrasound examination of the pelvis was performed including evaluation of the uterus, ovaries, adnexal regions, and pelvic cul-de-sac. COMPARISON:  None. FINDINGS: Uterus Measurements: 8.5 x 5.2 x 5.8 cm. No fibroids or other mass visualized. Endometrium Thickness: 6.1 mm.  No focal abnormality visualized. Right ovary Measurements: 1.7 x 1.5 x 1.8 cm. Normal appearance/no adnexal mass. Left ovary Measurements: 3.2 x 2.5 x 2.4 cm. Normal appearance/no adnexal mass. Other findings:  No abnormal free fluid IMPRESSION: 1. Normal study. No cause for bleeding identified. No retained products noted. Electronically Signed   By: Gerome Sam III M.D   On: 01/21/2018 14:58     *Consult with Dr. Jolayne Panther @ 1300 - notified of patient's complaints, assessments, lab & U/S results, tx plan d/c home, keep F/U appt with [facility] on 01/23/18 - ok to d/c home, agrees with plan   Assessment and Plan  Vaginal bleeding  - Bleeding precautions reviewed - Advised to return to MAU for heavy VB that soaks a pad/hr x 2 hrs - Advised to increase rest and water intake as much as she can for the rest of the weekend - Discharge patient - Keep scheduled appt with [facility] on Monday 01/23/18 - Patient verbalized an understanding of the plan of care and agrees.   Alexandra Mora, MSN, CNM 01/22/2018, 12:55 PM

## 2018-01-21 NOTE — Discharge Instructions (Signed)
Please return to Maternity Admissions Unit for heavy vaginal bleeding where you are soaking 1 pad every hour for 2 hrs.

## 2018-01-21 NOTE — MAU Note (Signed)
SVD 06/04, reports bleeding has been okay and last night it was heavier and passed a couple small clots, this AM bleeding was even heavier and passed a clot the size of a tangerine. Changes pad every 2 hours and reports it is saturated. Also reports some bloody mucus discharge

## 2018-01-23 ENCOUNTER — Other Ambulatory Visit: Payer: Self-pay

## 2018-01-23 ENCOUNTER — Ambulatory Visit (INDEPENDENT_AMBULATORY_CARE_PROVIDER_SITE_OTHER): Payer: BLUE CROSS/BLUE SHIELD | Admitting: Obstetrics

## 2018-01-23 ENCOUNTER — Encounter: Payer: Self-pay | Admitting: *Deleted

## 2018-01-23 ENCOUNTER — Encounter: Payer: Self-pay | Admitting: Obstetrics

## 2018-01-23 DIAGNOSIS — Z3009 Encounter for other general counseling and advice on contraception: Secondary | ICD-10-CM

## 2018-01-23 DIAGNOSIS — Z30430 Encounter for insertion of intrauterine contraceptive device: Secondary | ICD-10-CM

## 2018-01-23 DIAGNOSIS — Z30017 Encounter for initial prescription of implantable subdermal contraceptive: Secondary | ICD-10-CM

## 2018-01-23 DIAGNOSIS — Z1389 Encounter for screening for other disorder: Secondary | ICD-10-CM | POA: Diagnosis not present

## 2018-01-23 MED ORDER — ETONOGESTREL 68 MG ~~LOC~~ IMPL
68.0000 mg | DRUG_IMPLANT | Freq: Once | SUBCUTANEOUS | Status: AC
  Administered 2018-01-23: 68 mg via SUBCUTANEOUS

## 2018-01-23 NOTE — Progress Notes (Signed)
Office Stock used.  Administrations This Visit    etonogestrel (NEXPLANON) implant 68 mg    Admin Date 01/23/2018 Action Given Dose 68 mg Route Subdermal Administered By Maretta BeesMcGlashan, Cai Anfinson J, RMA

## 2018-01-23 NOTE — Progress Notes (Signed)
Subjective:     Alexandra MendsChristina Meadows is a 22 y.o. female who presents for a postpartum visit. She is 4 weeks postpartum following a spontaneous vaginal delivery. I have fully reviewed the prenatal and intrapartum course. The delivery was at 37.1 gestational weeks. Outcome: spontaneous vaginal delivery. Anesthesia: epidural. Postpartum course has been Unremarkable. Baby's course has been unremarkable. Baby is feeding by breast. Bleeding thick, heavy lochia. Bowel function is abnormal: constipated. Bladder function is normal. Patient is not sexually active. Contraception method is none. Postpartum depression screening: negative.  The following portions of the patient's history were reviewed and updated as appropriate: allergies, current medications, past family history, past medical history, past social history, past surgical history and problem list.  Review of Systems A comprehensive review of systems was negative.   Objective:    BP 109/73   Pulse 85   Ht 5\' 4"  (1.626 m)   Wt 156 lb (70.8 kg)   Breastfeeding? Yes   BMI 26.78 kg/m   General:  alert and no distress   Breasts:  inspection negative, no nipple discharge or bleeding, no masses or nodularity palpable  Lungs: clear to auscultation bilaterally  Heart:  regular rate and rhythm, S1, S2 normal, no murmur, click, rub or gallop  Abdomen: soft, non-tender; bowel sounds normal; no masses,  no organomegaly   Assessment:     1. Postpartum care following vaginal delivery - had an episode of AUB a few days ago.  [facility]  Now stable.  2. Encounter for other general counseling and advice on contraception - wants Nexplanon  3. Nexplanon insertion    Nexplanon Procedure Note   PRE-OP DIAGNOSIS: desired long-term, reversible contraception ( LARC ) POST-OP DIAGNOSIS: Same  PROCEDURE: Nexplanon  placement Performing Provider: Brock BadHARLES A. HARPER MD  Patient education prior to procedure, explained risk,  benefits of Nexplanon, reviewed alternative options. Patient reported understanding. Gave consent to continue with procedure.   PROCEDURE:  Pregnancy Text :  Negative Site (check):      left arm         Sterile Preparation:   Betadinex3 Lot # [id number] Expiration Date :  2021  OCT  16  Insertion site was selected 8 - 10 cm from medial epicondyle and marked along with guiding site using sterile marker. Procedure area was prepped and draped in a sterile fashion. 1% Lidocaine 1.5 ml given prior to procedure. Nexplanon  was inserted subcutaneously.Needle was removed from the insertion site. Nexplanon capsule was palpated by provider and patient to assure satisfactory placement. Dressing applied.  Followup: The patient tolerated the procedure well without complications.  Standard post-procedure care is explained and return precautions are given.   Plan:    1. Contraception: Nexplanon 2. Nexplanon inserted ( see procedure note ) 3. Follow up in: 2 weeks or as needed.     Charles a. Clearance CootsHarper md 01-23-2018

## 2018-02-06 ENCOUNTER — Ambulatory Visit: Payer: Self-pay | Admitting: Obstetrics

## 2018-02-08 ENCOUNTER — Telehealth: Payer: Self-pay

## 2018-02-08 ENCOUNTER — Ambulatory Visit (INDEPENDENT_AMBULATORY_CARE_PROVIDER_SITE_OTHER): Payer: BLUE CROSS/BLUE SHIELD | Admitting: Obstetrics

## 2018-02-08 ENCOUNTER — Encounter: Payer: Self-pay | Admitting: Obstetrics

## 2018-02-08 VITALS — BP 130/72 | HR 98 | Ht 64.0 in | Wt 156.0 lb

## 2018-02-08 DIAGNOSIS — Z3046 Encounter for surveillance of implantable subdermal contraceptive: Secondary | ICD-10-CM

## 2018-02-08 DIAGNOSIS — N939 Abnormal uterine and vaginal bleeding, unspecified: Secondary | ICD-10-CM | POA: Diagnosis not present

## 2018-02-08 NOTE — Telephone Encounter (Signed)
Pt called and stated shes still having some post partum bleeding, she is 6 weeks pp. Pt had nexplanon inserted 2 weeks ago at 4 weeks pp. Pt was instructed to come in for 2 week follow up appt by Dr Clearance CootsHarper but was not able to come in because she stated her son was in the hospital. I advised pt she needs to come in for follow up appointment and transferred her call up front to schedule this, pt verbalized understanding.

## 2018-02-08 NOTE — Progress Notes (Addendum)
Subjective:    Alexandra Meadows is a 22 y.o. female who presents for NEXPLANON POST INSERTION FOLLOW UP. The patient has had HEAVY VAGINAL BLEEDING  SINCE INSERTION 2 WEEKS AGO. The patient is sexually active. Pertinent past medical history: none.  The information documented in the HPI was reviewed and verified.  Menstrual History: OB History    Gravida  2   Para  2   Term  2   Preterm      AB      Living  2     SAB      TAB      Ectopic      Multiple  0   Live Births  2            No LMP recorded.   Patient Active Problem List   Diagnosis Date Noted  . Vaginal bleeding 01/21/2018  . SVD (spontaneous vaginal delivery) 12/27/2017  . Labor and delivery indication for care or intervention 12/26/2017  . [redacted] weeks gestation of pregnancy   . 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 [redacted] weeks gestation of pregnancy   . Small for gestational age   . Uterine size date discrepancy pregnancy, third trimester   . Umbilical vein abnormality affecting pregnancy   . Vitamin D deficiency 07/05/2017  . Supervision of high-risk pregnancy 06/30/2017  . 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 Anxiety and depression 02/17/2014  . Depression during pregnancy in third trimester 11/29/2013  . INSOMNIA 03/20/2008  . ADHD 08/29/2007  . MIGRAINE, COMMON W/O INTRACTABLE MIGRAINE 03/29/2007   Past Medical History:  Diagnosis Date  . Anemia   . Anxiety   . Asthma    prn inhaler; exercise induced  . Depression    severe at 20weeks  . 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 Infection    BV with preg; recurrent yeast inf  . Migraines   . Seasonal allergies   . Tonsillar hypertrophy 04/2016    Past Surgical History:  Procedure Laterality Date  . KNEE ARTHROSCOPY Left 02/28/2014   Procedure: ARTHROSCOPY LEFT KNEE WITH SYNOVECTOMY LIMITED, PARTIAL LATERAL MENISECTOMY;  Surgeon: Loreta Aveaniel F Murphy, MD;  Location: [facility];  Service: Orthopedics;  Laterality: Left;  . TONSILLECTOMY AND ADENOIDECTOMY Bilateral 05/25/2016   Procedure: TONSILLECTOMY AND ADENOIDECTOMY;  Surgeon: Newman PiesSu Teoh, MD;  Location: [facility];  Service: ENT;  Laterality: Bilateral;  . WISDOM TOOTH EXTRACTION  2015     Current Outpatient Medications:  .  Prenat-FeFum-FePo-FA-DHA w/o A (PROVIDA DHA) 16-16-1.25-110 MG CAPS, Take 1 tablet by mouth at bedtime., Disp: 30 capsule, Rfl: 12 .  albuterol (PROVENTIL HFA;VENTOLIN HFA) 108 (90 Base) MCG/ACT inhaler, Inhale 2 puffs into the lungs every 4 (four) hours as needed for wheezing or shortness of breath. (Patient not taking: Reported on 01/23/2018), Disp: 1 Inhaler, Rfl: 11 .  ibuprofen (ADVIL,MOTRIN) 600 MG tablet, Take 1 tablet (600 mg total) by mouth every 6 (six) hours as needed for mild pain, moderate pain or cramping. (Patient not taking: Reported on 01/23/2018), Disp: 30 tablet, Rfl: 0 .  loratadine (CLARITIN) 10 MG tablet, Take 1 tablet (10 mg total) by mouth daily. (Patient not taking: Reported on 01/23/2018), Disp: 30 tablet, Rfl: 11 .  oxyCODONE-acetaminophen (PERCOCET/ROXICET) 5-325 MG tablet, Take 1 tablet by mouth every 6 (six) hours as needed for severe pain. (Patient not taking: Reported on 01/23/2018), Disp: 12 tablet, Rfl: 0 .  sertraline (ZOLOFT) 100 MG tablet, Take 1 tablet (100 mg total) by mouth daily. (Patient not taking: Reported on 01/23/2018), Disp: 30 tablet, Rfl: 12 Allergies  Allergen Reactions  . Bee Venom Hives and Swelling  . Tetanus Toxoids Hives  . Xanax [Alprazolam] Other (See Comments)    SEIZURES    Social History   Tobacco Use  . Smoking status: Never Smoker  . Smokeless tobacco: Never Used  . Tobacco comment: quit  Substance Use Topics  . Alcohol use: Yes    Alcohol/week: 0.0 oz    Comment: occ    Family History  Problem Relation Age of Onset  . Diabetes Mother   . Cancer Maternal Grandfather        Review of Systems Constitutional:  negative for weight loss Genitourinary:negative for abnormal menstrual periods and vaginal discharge   Objective:   BP 130/72   Pulse 98   Ht 5\' 4"  (1.626 m)   Wt 156 lb (70.8 kg)   Breastfeeding? Yes   BMI 26.78 kg/m    PE:            General:  Alert and no distress          Left Upper Arm:  Nexplanon insertion site is clean, dry, intact and non tender.  Rod palpated, intact.  Lab Review Urine pregnancy test Labs reviewed yes Radiologic studies reviewed no  50% of 15 min visit spent on counseling and coordination of care.    Assessment:    22 y.o., continuing Nexplanon, no contraindications.   AUB  Plan:   TAYTULLA  DISPENSED FOR AUB   All questions answered. Contraception: Nexplanon. Diagnosis explained in detail, including differential. Discussed healthy lifestyle modifications. Follow up in several months.   No orders of the defined types were placed in this encounter.  No orders of the defined types were placed in this encounter.   Brock Bad MD 02-08-2018

## 2018-02-08 NOTE — Progress Notes (Signed)
Patient is in the office for nexplanon follow up. Nexplanon inserted 01-23-18, pt complains of heavy bleeding, changing pad every 2 hours.

## 2018-02-20 ENCOUNTER — Ambulatory Visit (INDEPENDENT_AMBULATORY_CARE_PROVIDER_SITE_OTHER): Payer: BLUE CROSS/BLUE SHIELD

## 2018-02-20 VITALS — BP 121/70 | HR 70 | Ht 64.0 in | Wt 153.0 lb

## 2018-02-20 DIAGNOSIS — N3001 Acute cystitis with hematuria: Secondary | ICD-10-CM | POA: Diagnosis not present

## 2018-02-20 LAB — POCT URINALYSIS DIPSTICK
Bilirubin, UA: NEGATIVE
Glucose, UA: NEGATIVE
KETONES UA: POSITIVE
NITRITE UA: NEGATIVE
PROTEIN UA: POSITIVE — AB
RBC UA: POSITIVE
SPEC GRAV UA: 1.02 (ref 1.010–1.025)
UROBILINOGEN UA: 0.2 U/dL
pH, UA: 7 (ref 5.0–8.0)

## 2018-02-20 MED ORDER — CEFUROXIME AXETIL 500 MG PO TABS: 500.0000 mg | ORAL_TABLET | Freq: Two times a day (BID) | ORAL | 0 refills | Status: DC

## 2018-02-20 NOTE — Progress Notes (Signed)
Pt is here for nurse visit with c/o dysuria, blood in urine, urinary incontinence that has been going on for 3 days. Pt is currently breastfeeding. Dr. Clearance CootsHarper advised pt to start Ceftin rx sent to patients pharmacy and urine culture sent, pt verbalized understanding.

## 2018-02-22 ENCOUNTER — Other Ambulatory Visit: Payer: Self-pay | Admitting: Obstetrics

## 2018-02-22 LAB — URINE CULTURE

## 2018-03-07 ENCOUNTER — Ambulatory Visit (INDEPENDENT_AMBULATORY_CARE_PROVIDER_SITE_OTHER): Payer: Medicaid Other | Admitting: Family Medicine

## 2018-03-07 DIAGNOSIS — Z23 Encounter for immunization: Secondary | ICD-10-CM | POA: Diagnosis not present

## 2018-03-08 ENCOUNTER — Telehealth: Payer: Self-pay

## 2018-03-08 NOTE — Progress Notes (Deleted)
NEUROLOGY FOLLOW UP OFFICE NOTE  Alexandra Meadows [id number]  HISTORY OF PRESENT ILLNESS: Alexandra MendsChristina Meadows is a 22 year old right-handed female with anxiety, depression, migraine and history of left knee surgery whom I previously have seen for episodes of recurrent loss of consciousness (possibly induced by Xanax) and paresthesias including suspected carpal tunnel syndrome, last seen in December 2017 follows up for worsening carpal tunnel syndrome.    After giving birth to her daughter in September 2017, she developed intermittent numbness and tingling in both hands, usually when she was on her computer.  She had no associated neck pain, radicular pain down the arms, or upper extremity weakness.  NCV-EMG of the upper extremities from 07/29/16 was normal.  A mild carpal tunnel syndrome was still suspected and I recommended wearing wrist splints.  ***  PAST MEDICAL HISTORY: Past Medical History:  Diagnosis Date  . Anemia   . Anxiety   . Asthma    prn inhaler; exercise induced  . Depression    severe at 20weeks  . 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 Infection    BV with preg; recurrent yeast inf  . Migraines   . Seasonal allergies   . Tonsillar hypertrophy 04/2016    MEDICATIONS: Current Outpatient Medications on File Prior to Visit  Medication Sig Dispense Refill  . albuterol (PROVENTIL HFA;VENTOLIN HFA) 108 (90 Base) MCG/ACT inhaler Inhale 2 puffs into the lungs every 4 (four) hours as needed for wheezing or shortness of breath. (Patient not taking: Reported on 01/23/2018) 1 Inhaler 11  . cefUROXime (CEFTIN) 500 MG tablet Take 1 tablet (500 mg total) by mouth 2 (two) times daily with a meal. 14 tablet 0  . ibuprofen (ADVIL,MOTRIN) 600 MG tablet Take 1 tablet (600 mg total) by mouth every 6 (six) hours as needed for mild pain, moderate pain or cramping. (Patient not taking: Reported on 01/23/2018) 30 tablet 0  . loratadine (CLARITIN) 10 MG tablet  Take 1 tablet (10 mg total) by mouth daily. (Patient not taking: Reported on 01/23/2018) 30 tablet 11  . oxyCODONE-acetaminophen (PERCOCET/ROXICET) 5-325 MG tablet Take 1 tablet by mouth every 6 (six) hours as needed for severe pain. (Patient not taking: Reported on 01/23/2018) 12 tablet 0  . Prenat-FeFum-FePo-FA-DHA w/o A (PROVIDA DHA) 16-16-1.25-110 MG CAPS Take 1 tablet by mouth at bedtime. 30 capsule 12  . sertraline (ZOLOFT) 100 MG tablet Take 1 tablet (100 mg total) by mouth daily. (Patient not taking: Reported on 01/23/2018) 30 tablet 12   No current facility-administered medications on file prior to visit.     ALLERGIES: Allergies  Allergen Reactions  . Bee Venom Hives and Swelling  . Tetanus Toxoids Hives  . Xanax [Alprazolam] Other (See Comments)    SEIZURES    FAMILY HISTORY: Family History  Problem Relation Age of Onset  . Diabetes Mother   . Cancer Maternal Grandfather    ***.  SOCIAL HISTORY: Social History   Socioeconomic History  . Marital status: Single    Spouse name: Not on file  . Number of children: Not on file  . Years of education: Not on file  . Highest education level: Not on file  Occupational History  . Not on file  Social Needs  . Financial resource strain: Not on file  . Food insecurity:    Worry: Not on file    Inability: Not on file  . Transportation needs:    Medical: Not  on file    Non-medical: Not on file  Tobacco Use  . Smoking status: Never Smoker  . Smokeless tobacco: Never Used  . Tobacco comment: quit  Substance and Sexual Activity  . Alcohol use: Yes    Alcohol/week: 0.0 standard drinks    Comment: occ  . Drug use: No  . Sexual activity: Yes    Partners: Male    Birth control/protection: None  Lifestyle  . Physical activity:    Days per week: Not on file    Minutes per session: Not on file  . Stress: Not on file  Relationships  . Social connections:    Talks on phone: Not on file    Gets together: Not on file     Attends religious service: Not on file    Active member of club or organization: Not on file    Attends meetings of clubs or organizations: Not on file    Relationship status: Not on file  . Intimate partner violence:    Fear of current or ex partner: Not on file    Emotionally abused: Not on file    Physically abused: Not on file    Forced sexual activity: Not on file  Other Topics Concern  . 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   Lives with parents in a 2 story home.  Currently not working.  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  Education: college.    REVIEW OF SYSTEMS: Constitutional: No fevers, chills, or sweats, no generalized fatigue, change in appetite Eyes: No visual changes, double vision, eye pain Ear, nose and throat: No hearing loss, ear pain, nasal congestion, sore throat Cardiovascular: No chest pain, palpitations Respiratory:  No shortness of breath at rest or with exertion, wheezes GastrointestinaI: No nausea, vomiting, diarrhea, abdominal pain, fecal incontinence Genitourinary:  No dysuria, urinary retention or frequency Musculoskeletal:  No neck pain, back pain Integumentary: No rash, pruritus, skin lesions Neurological: as above Psychiatric: No depression, insomnia, anxiety Endocrine: No palpitations, fatigue, diaphoresis, mood swings, change in appetite, change in weight, increased thirst Hematologic/Lymphatic:  No purpura, petechiae. Allergic/Immunologic: no itchy/runny eyes, nasal congestion, recent allergic reactions, rashes  PHYSICAL EXAM: There were no vitals filed for this visit. General: No acute distress.  Patient appears ***-groomed.  *** body habitus. Head:  Normocephalic/atraumatic Eyes:  Fundi examined but not visualized Neck: supple, no paraspinal tenderness, full range of  motion Heart:  Regular rate and rhythm Lungs:  Clear to auscultation bilaterally Back: No paraspinal tenderness Neurological Exam: alert and oriented to person, place, and time. Attention span and concentration intact, recent and remote memory intact, fund of knowledge intact.  Speech fluent and not dysarthric, language intact.  CN II-XII intact. Bulk and tone normal, muscle strength 5/5 throughout.  Sensation to light touch, temperature and vibration intact.  Deep tendon reflexes 2+ throughout, toes downgoing.  Finger to nose and heel to shin testing intact.  Gait normal, Romberg negative.  IMPRESSION: ***  PLAN: ***  Shon MilletAdam Latonia Conrow, DO  CC: ***

## 2018-03-08 NOTE — Telephone Encounter (Signed)
TC from pt c/o possible Kidney infection. Pt recently treated for UTI on 02/20/18 Consulted w/ provider  No ava appts Pt urged to go be evaluated by her PCP or walk in urgent care. Pt voiced understanding.

## 2018-03-09 ENCOUNTER — Ambulatory Visit: Payer: Self-pay | Admitting: Neurology

## 2018-03-09 ENCOUNTER — Encounter: Payer: Self-pay | Admitting: Family Medicine

## 2018-03-09 ENCOUNTER — Ambulatory Visit (INDEPENDENT_AMBULATORY_CARE_PROVIDER_SITE_OTHER): Payer: BLUE CROSS/BLUE SHIELD | Admitting: Family Medicine

## 2018-03-09 VITALS — BP 120/60 | HR 76 | Temp 98.1°F | Wt 153.6 lb

## 2018-03-09 DIAGNOSIS — R350 Frequency of micturition: Secondary | ICD-10-CM | POA: Diagnosis not present

## 2018-03-09 DIAGNOSIS — N3000 Acute cystitis without hematuria: Secondary | ICD-10-CM

## 2018-03-09 LAB — POCT URINALYSIS DIPSTICK
Bilirubin, UA: NEGATIVE
Blood, UA: NEGATIVE
GLUCOSE UA: NEGATIVE
Ketones, UA: NEGATIVE
LEUKOCYTES UA: NEGATIVE
Nitrite, UA: NEGATIVE
ODOR: NEGATIVE
PROTEIN UA: NEGATIVE
Urobilinogen, UA: 0.2 E.U./dL
pH, UA: 6 (ref 5.0–8.0)

## 2018-03-09 LAB — TB SKIN TEST
INDURATION: 0 mm
TB SKIN TEST: NEGATIVE

## 2018-03-09 MED ORDER — CEPHALEXIN 500 MG PO CAPS: 500.0000 mg | ORAL_CAPSULE | Freq: Two times a day (BID) | ORAL | 0 refills | Status: AC

## 2018-03-09 NOTE — Progress Notes (Signed)
Alexandra MendsChristina Meadows DOB: 01/11/1996 Encounter date: 03/09/2018  This is a 22 y.o. female who presents with  Chief Complaint  Patient presents with  . UTI symptoms    lower back pain both sides, lower abdominal pain on both sides, frequency, saw OBGYN 7/17 and had blood and puss in the urin, pt has not noticed nay blood this time, feels the need to use the restroom but unable to go reuglarly.    Didn't complete antibiotic she was given in July. Took 3 days of antibiotic. Was busy and didn't end up taking all of it. Moved in process of taking it and does not know where script is. Was having blood and pus in urine; hasn't noticed that lately. Within a few doses of antibiotic she did start to feel better. Was worried she was pregnant but took test 2 days ago and this was negative. Having abdominal pain and some back pain so worried about infection. Has had similar discomfort with breastfeeding in past.   Has had some hot and cold chills. Has been getting more hot. Did have diarrhea a couple of days ago.   Diarrhea a couple of days ago. One day; not since. No vomiting.   Has had kidney infection in past; not certain that symptoms are similar.   She has had normal amount/consistency of vaginal discharge. Slight odor. No hx of G/C and no current risk for this.   Urinary Tract Infection: Patient complains of frequency and hesitancy She has had symptoms for 2 weeks. Patient also complains of back pain and fatigue. Patient deniesfever. Patient does not have a history of recurrent UTI.  Patient does have a history of pyelonephritis.   See HPI for pertinent review of systems  Review of Systems  Constitutional: Positive for chills and malaise/fatigue. Negative for fever.  Gastrointestinal: Positive for diarrhea and nausea. Negative for constipation and vomiting.  Genitourinary: Positive for flank pain, frequency and urgency. Negative for dysuria and hematuria.  Musculoskeletal: Positive for back pain.     Objective:  BP 120/60 (BP Location: Left Arm, Patient Position: Sitting, Cuff Size: Normal)   Pulse 76   Temp 98.1 F (36.7 C) (Oral)   Wt 153 lb 9.6 oz (69.7 kg)   SpO2 98%   Breastfeeding? Yes   BMI 26.37 kg/m   Weight: 153 lb 9.6 oz (69.7 kg)   Physical Exam  Constitutional: She appears well-developed and well-nourished. No distress.  Cardiovascular: Normal rate, regular rhythm and normal heart sounds. Exam reveals no friction rub.  No murmur heard. No lower extremity edema  Pulmonary/Chest: Effort normal and breath sounds normal. No respiratory distress. She has no wheezes. She has no rales.  Abdominal: Soft. Normal appearance and bowel sounds are normal. She exhibits no distension, no ascites and no mass. There is no hepatosplenomegaly. There is tenderness (mild upper abd) in the suprapubic area. There is CVA tenderness (more significant on left). There is no rigidity, no rebound, no guarding, no tenderness at McBurney's point and negative Murphy's sign.  There is some suprapubic pressure but not significant pain on exam  Skin: Skin is warm and dry.  She is somewhat pale  Psychiatric: She has a normal mood and affect. Her speech is normal and behavior is normal.    Assessment/Plan:  1. Frequency of urination - POCT urinalysis dipstick  2. Acute cystitis with hematuria Although UA in office today was not concerning for infection; she did appear to have some colonization on previous culture from 7/29. We discussed  that bacterial load was low, but since her symptoms seemed to improve with treatment, I think reasonable at this time to retreat and send urine for repeat culture. Make sure to complete antibiotic this time. We will check in with her once culture results are back. Let us know if any worsening of symptoms in the meanwhile.  - Culture, Urine - cephALEXin (KEFLEX) 500 MG capsule; Take 1 capsule (500 mg total) by mouth 2 (two) times daily for 7 days.  Dispense: 14  capsule; Refill: 0    Take antibiotic as directed. We will call you with urine culture results when we get them. If you have any worsening of symptoms call us.   Of note she is under quite a bit of stress caring for 3 children including a premature infant all under the age of 233. 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 She feels overwhelmed somewhat and has just moved further away from family. Work will be prn and she will get to pick hours, so she feels somewhat reassured about this.   No follow-ups on file.  Theodis ShoveJunell Azir Muzyka, MD

## 2018-03-09 NOTE — Patient Instructions (Signed)
Complete antibiotic as directed. I will call with urine culture results when we get those. Let us know sooner if any worsening of symptoms.

## 2018-03-10 ENCOUNTER — Ambulatory Visit (INDEPENDENT_AMBULATORY_CARE_PROVIDER_SITE_OTHER): Payer: BLUE CROSS/BLUE SHIELD | Admitting: Neurology

## 2018-03-10 ENCOUNTER — Encounter: Payer: Self-pay | Admitting: Neurology

## 2018-03-10 VITALS — BP 104/58 | HR 91 | Wt 153.0 lb

## 2018-03-10 DIAGNOSIS — G5603 Carpal tunnel syndrome, bilateral upper limbs: Secondary | ICD-10-CM

## 2018-03-10 MED ORDER — AMBULATORY NON FORMULARY MEDICATION: 0 refills | Status: DC

## 2018-03-10 NOTE — Progress Notes (Signed)
NEUROLOGY FOLLOW UP OFFICE NOTE  Alexandra MendsChristina Renninger [id number]  HISTORY OF PRESENT ILLNESS: Alexandra Meadows is a 22 year old right-handed female with anxiety, depression, migraine and history of left knee surgery whom I previously have seen for episodes of recurrent loss of consciousness (possibly induced by Xanax) and paresthesias including suspected carpal tunnel syndrome, last seen in December 2017 follows up for worsening carpal tunnel syndrome.    After giving birth to her daughter in September 2017, she developed intermittent numbness and tingling in both hands, usually when she was on her computer.  She had no associated neck pain, radicular pain down the arms, or upper extremity weakness.  NCV-EMG of the upper extremities from 07/29/16 was normal.  A mild carpal tunnel syndrome was still suspected and I recommended wearing wrist splints.  She was getting acupuncture which helped.  She had her son 10 months ago and it has gotten worse.  It is spreading to the elbows.  She is having trouble holding her baby.  She has been dropping her phone and trouble opening water bottles.  PAST MEDICAL HISTORY: Past Medical History:  Diagnosis Date  . Anemia   . Anxiety   . Asthma    prn inhaler; exercise induced  . Depression    severe at 20weeks  . 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 Infection    BV with preg; recurrent yeast inf  . Migraines   . Seasonal allergies   . Tonsillar hypertrophy 04/2016    MEDICATIONS: Current Outpatient Medications on File Prior to Visit  Medication Sig Dispense Refill  . albuterol (PROVENTIL HFA;VENTOLIN HFA) 108 (90 Base) MCG/ACT inhaler Inhale 2 puffs into the lungs every 4 (four) hours as needed for wheezing or shortness of breath. 1 Inhaler 11  . cephALEXin (KEFLEX) 500 MG capsule Take 1 capsule (500 mg total) by mouth 2 (two) times daily for 7 days. 14 capsule 0  . ibuprofen (ADVIL,MOTRIN) 600 MG tablet Take 1  tablet (600 mg total) by mouth every 6 (six) hours as needed for mild pain, moderate pain or cramping. 30 tablet 0  . loratadine (CLARITIN) 10 MG tablet Take 1 tablet (10 mg total) by mouth daily. 30 tablet 11  . sertraline (ZOLOFT) 100 MG tablet Take 1 tablet (100 mg total) by mouth daily. 30 tablet 12   No current facility-administered medications on file prior to visit.     ALLERGIES: Allergies  Allergen Reactions  . Bee Venom Hives and Swelling  . Tetanus Toxoids Hives  . Xanax [Alprazolam] Other (See Comments)    SEIZURES    FAMILY HISTORY: Family History  Problem Relation Age of Onset  . Diabetes Mother   . Cancer Maternal Grandfather     SOCIAL HISTORY: Social History   Socioeconomic History  . Marital status: Single    Spouse name: Not on file  . Number of children: Not on file  . Years of education: Not on file  . Highest education level: Not on file  Occupational History  . Not on file  Social Needs  . Financial resource strain: Not on file  . Food insecurity:    Worry: Not on file    Inability: Not on file  . Transportation needs:    Medical: Not on file    Non-medical: Not on file  Tobacco Use  . Smoking status: Never Smoker  . Smokeless tobacco: Never Used  . Tobacco comment: quit  Substance and Sexual Activity  . Alcohol use: Yes    Alcohol/week: 0.0 standard drinks    Comment: occ  . Drug use: No  . Sexual activity: Yes    Partners: Male    Birth control/protection: None  Lifestyle  . Physical activity:    Days per week: Not on file    Minutes per session: Not on file  . Stress: Not on file  Relationships  . Social connections:    Talks on phone: Not on file    Gets together: Not on file    Attends religious service: Not on file    Active member of club or organization: Not on file    Attends meetings of clubs or organizations: Not on file    Relationship status: Not on file  . Intimate partner violence:    Fear of current or ex  partner: Not on file    Emotionally abused: Not on file    Physically abused: Not on file    Forced sexual activity: Not on file  Other Topics Concern  . 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   Lives with parents in a 2 story home.  Currently not working.  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  Education: college.    REVIEW OF SYSTEMS: Constitutional: No fevers, chills, or sweats, no generalized fatigue, change in appetite Eyes: No visual changes, double vision, eye pain Ear, nose and throat: No hearing loss, ear pain, nasal congestion, sore throat Cardiovascular: No chest pain, palpitations Respiratory:  No shortness of breath at rest or with exertion, wheezes GastrointestinaI: No nausea, vomiting, diarrhea, abdominal pain, fecal incontinence Genitourinary:  No dysuria, urinary retention or frequency Musculoskeletal:  No neck pain, back pain Integumentary: No rash, pruritus, skin lesions Neurological: as above Psychiatric: No depression, insomnia, anxiety Endocrine: No palpitations, fatigue, diaphoresis, mood swings, change in appetite, change in weight, increased thirst Hematologic/Lymphatic:  No purpura, petechiae. Allergic/Immunologic: no itchy/runny eyes, nasal congestion, recent allergic reactions, rashes  PHYSICAL EXAM: Blood pressure (!) 104/58, pulse 91, weight 153 lb (69.4 kg), SpO2 97 %, currently breastfeeding. General: No acute distress.  Patient appears well-groomed.   Head:  Normocephalic/atraumatic Eyes:  Fundi examined but not visualized Neck: supple, no paraspinal tenderness, full range of motion Heart:  Regular rate and rhythm Lungs:  Clear to auscultation bilaterally Back: No paraspinal tenderness Neurological Exam: alert and oriented to person, place, and time. Attention span and concentration  intact, recent and remote memory intact, fund of knowledge intact.  Speech fluent and not dysarthric, language intact.  CN II-XII intact. Bulk and tone normal, muscle strength 5/5 throughout.  Sensation to pinprick reduced in the right thumb and index finger.  Positive Tinel's and Phalen sign (right worse than left); vibration intact.  Deep tendon reflexes 2+ throughout, toes downgoing.  Finger to nose and heel to shin testing intact.  Gait normal, Romberg negative.  IMPRESSION: Bilateral carpal tunnel syndrome  PLAN: 1.  Provide prescription for bilateral wrist splints 2.  NCV-EMG of upper extremities to re-evaluate for carpal tunnel syndrome 3.  Further recommendations pending results and efficacy of wrist splints (may need to refer to hand specialist.  She has previously seen Dr. Eulah PontMurphy for her knee.  20 minutes spent face to face with patient, over 50% spent discussing management.  Shon MilletAdam Flint Hakeem,  DO  CC:  Alysia Penna, MD

## 2018-03-10 NOTE — Patient Instructions (Signed)
1.  I will provide you a prescription for bilateral wrist splints.  [facility].  We will schedule you for another nerve conduction study of the hands to re-evaluate for carpal tunnel syndrome.

## 2018-03-11 LAB — URINE CULTURE
MICRO NUMBER: 90971425
SPECIMEN QUALITY: ADEQUATE

## 2018-03-28 ENCOUNTER — Telehealth: Payer: Self-pay | Admitting: Family Medicine

## 2018-03-28 ENCOUNTER — Ambulatory Visit (INDEPENDENT_AMBULATORY_CARE_PROVIDER_SITE_OTHER): Payer: BLUE CROSS/BLUE SHIELD | Admitting: Neurology

## 2018-03-28 DIAGNOSIS — Z209 Contact with and (suspected) exposure to unspecified communicable disease: Secondary | ICD-10-CM

## 2018-03-28 DIAGNOSIS — R202 Paresthesia of skin: Secondary | ICD-10-CM

## 2018-03-28 DIAGNOSIS — G5603 Carpal tunnel syndrome, bilateral upper limbs: Secondary | ICD-10-CM

## 2018-03-28 NOTE — Telephone Encounter (Signed)
Sent to PCP to advise 

## 2018-03-28 NOTE — Telephone Encounter (Signed)
Copied from CRM [id number]. Topic: Quick Communication - See Telephone Encounter >> Mar 28, 2018  9:21 AM Jens Som A wrote: CRM for notification. See Telephone encounter for: 03/28/18. Patient called is is requesting orders for all STDs please advise Patients call back number is [phone]

## 2018-03-28 NOTE — Procedures (Signed)
[facility]  [address] [place], [address]  [place], [place] [address] Tel: [phone] Fax:  [phone] Test Date:  03/28/2018  Patient: Alexandra Meadows DOB: 16-Mar-1996 Physician: Nita Sickle, DO  Sex: Female Height: 5\' 4"  Ref Phys: Shon Millet, DO  ID#: [id number] Temp: 33.1C Technician:    Patient Complaints: This is a 22 year old female referred for evaluation of bilateral hand paresthesias and weakness.  NCV & EMG Findings: Extensive electrodiagnostic testing of the right upper extremity and additional studies of the left shows:  1. Bilateral median, ulnar, and mixed palmar sensory responses are within normal limits. 2. Bilateral median and ulnar motor responses are within normal limits. 3. There is no evidence of active or chronic motor axon loss changes affecting any of the tested muscles. Motor unit configuration and recruitment pattern is within normal limits.  Impression: This is a normal study of the upper extremities. In particular, there is no evidence of carpal tunnel syndrome or cervical radiculopathy affecting the upper extremities.   ___________________________ Nita Sickle, DO    Nerve Conduction Studies Anti Sensory Summary Table   Site NR Peak (ms) Norm Peak (ms) P-T Amp (V) Norm P-T Amp  Left Median Anti Sensory (2nd Digit)  33.1C  Wrist    2.8 <3.3 45.2 >20  Right Median Anti Sensory (2nd Digit)  33.1C  Wrist    2.8 <3.3 50.6 >20  Left Ulnar Anti Sensory (5th Digit)  33.1C  Wrist    2.7 <3.0 32.6 >18  Right Ulnar Anti Sensory (5th Digit)  33.1C  Wrist    2.4 <3.0 33.8 >18   Motor Summary Table   Site NR Onset (ms) Norm Onset (ms) O-P Amp (mV) Norm O-P Amp Site1 Site2 Delta-0 (ms) Dist (cm) Vel (m/s) Norm Vel (m/s)  Left Median Motor (Abd Poll Brev)  33.1C  Wrist    2.8 <3.9 8.2 >6 Elbow Wrist 4.2 26.0 62 >51  Elbow    7.0  7.9         Right Median Motor (Abd Poll Brev)  33.1C  Wrist    2.5 <3.9 8.4 >6 Elbow Wrist 4.5 30.0 67 >51   Elbow    7.0  8.0         Left Ulnar Motor (Abd Dig Minimi)  33.1C  Wrist    2.2 <3.0 10.2 >8 B Elbow Wrist 3.6 23.0 64 >51  B Elbow    5.8  8.7  A Elbow B Elbow 1.6 10.0 62 >51  A Elbow    7.4  8.1         Right Ulnar Motor (Abd Dig Minimi)  33.1C  Wrist    2.0 <3.0 12.1 >8 B Elbow Wrist 3.6 22.0 61 >51  B Elbow    5.6  11.4  A Elbow B Elbow 1.7 10.0 59 >51  A Elbow    7.3  10.6          Comparison Summary Table   Site NR Peak (ms) Norm Peak (ms) P-T Amp (V) Site1 Site2 Delta-P (ms) Norm Delta (ms)  Left Median/Ulnar Palm Comparison (Wrist - 8cm)  33.1C  Median Palm    1.7 <2.2 63.9 Median Palm Ulnar Palm 0.3   Ulnar Palm    1.4 <2.2 27.8      Right Median/Ulnar Palm Comparison (Wrist - 8cm)  33.1C  Median Palm    1.8 <2.2 68.6 Median Palm Ulnar Palm 0.2   Ulnar Palm    1.6 <2.2 26.3  EMG   Side Muscle Ins Act Fibs Psw Fasc Number Recrt Dur Dur. Amp Amp. Poly Poly. Comment  Right 1stDorInt Nml Nml Nml Nml Nml Nml Nml Nml Nml Nml Nml Nml N/A  Right PronatorTeres Nml Nml Nml Nml Nml Nml Nml Nml Nml Nml Nml Nml N/A  Right Biceps Nml Nml Nml Nml Nml Nml Nml Nml Nml Nml Nml Nml N/A  Right Triceps Nml Nml Nml Nml Nml Nml Nml Nml Nml Nml Nml Nml N/A  Right Deltoid Nml Nml Nml Nml Nml Nml Nml Nml Nml Nml Nml Nml N/A  Left 1stDorInt Nml Nml Nml Nml Nml Nml Nml Nml Nml Nml Nml Nml N/A  Left PronatorTeres Nml Nml Nml Nml Nml Nml Nml Nml Nml Nml Nml Nml N/A  Left Biceps Nml Nml Nml Nml Nml Nml Nml Nml Nml Nml Nml Nml N/A  Left Triceps Nml Nml Nml Nml Nml Nml Nml Nml Nml Nml Nml Nml N/A  Left Deltoid Nml Nml Nml Nml Nml Nml Nml Nml Nml Nml Nml Nml N/A      Waveforms:

## 2018-03-29 ENCOUNTER — Telehealth: Payer: Self-pay | Admitting: Neurology

## 2018-03-29 NOTE — Telephone Encounter (Signed)
The orders are in. She can make a lab appt

## 2018-03-29 NOTE — Telephone Encounter (Signed)
Patient called and was needing to speak with you regarding her test results. She has some questions for you regarding those results. Please Call. Thanks

## 2018-03-29 NOTE — Telephone Encounter (Signed)
Called and spoke with pt. Pt advised and voiced understanding.  

## 2018-03-30 ENCOUNTER — Telehealth: Payer: Self-pay

## 2018-03-30 ENCOUNTER — Other Ambulatory Visit: Payer: Self-pay

## 2018-03-30 DIAGNOSIS — R202 Paresthesia of skin: Secondary | ICD-10-CM

## 2018-03-30 NOTE — Progress Notes (Signed)
I entered incorrect imaging study order. Icalled [facility], spoke with Drenda Freeze. I advised her to cx the MRI of the brain, it should have been a C spine, and I have entered that order.. She will contact Pt to schedule

## 2018-03-30 NOTE — Telephone Encounter (Signed)
-----   Message from Drema Dallas, DO sent at 03/29/2018  8:11 AM EDT ----- The nerve study did not reveal any evidence of carpal tunnel syndrome.  After this much time, I would expect to find some evidence on the test.  Therefore, I would like to check MRI of cervical spine without contrast to evaluate for cause of bilateral hand and arm numbness.

## 2018-03-30 NOTE — Telephone Encounter (Signed)
Called and spoke with Pt, advised her of EMG results  And MRI recommendation.

## 2018-03-31 ENCOUNTER — Telehealth: Payer: Self-pay

## 2018-03-31 NOTE — Telephone Encounter (Signed)
Returned call pt questioned having possible herpes outbreak, denies itching, pain, states bump may have been from shaving. Advised pt to call if any changes occur.

## 2018-04-04 ENCOUNTER — Ambulatory Visit (INDEPENDENT_AMBULATORY_CARE_PROVIDER_SITE_OTHER): Payer: BLUE CROSS/BLUE SHIELD | Admitting: Obstetrics

## 2018-04-04 ENCOUNTER — Encounter: Payer: Self-pay | Admitting: Obstetrics

## 2018-04-04 ENCOUNTER — Other Ambulatory Visit (HOSPITAL_COMMUNITY)
Admission: RE | Admit: 2018-04-04 | Discharge: 2018-04-04 | Disposition: A | Discharge status: Home / Self Care | Payer: BLUE CROSS/BLUE SHIELD | Source: Ambulatory Visit | Attending: Obstetrics | Admitting: Obstetrics

## 2018-04-04 VITALS — BP 126/75 | HR 90 | Ht 64.0 in | Wt 154.0 lb

## 2018-04-04 DIAGNOSIS — N898 Other specified noninflammatory disorders of vagina: Secondary | ICD-10-CM

## 2018-04-04 DIAGNOSIS — R300 Dysuria: Secondary | ICD-10-CM

## 2018-04-04 DIAGNOSIS — N760 Acute vaginitis: Secondary | ICD-10-CM | POA: Diagnosis not present

## 2018-04-04 DIAGNOSIS — B9689 Other specified bacterial agents as the cause of diseases classified elsewhere: Secondary | ICD-10-CM | POA: Insufficient documentation

## 2018-04-04 DIAGNOSIS — L0232 Furuncle of buttock: Secondary | ICD-10-CM

## 2018-04-04 DIAGNOSIS — L738 Other specified follicular disorders: Secondary | ICD-10-CM

## 2018-04-04 DIAGNOSIS — N3001 Acute cystitis with hematuria: Secondary | ICD-10-CM

## 2018-04-04 LAB — POCT URINALYSIS DIPSTICK
BILIRUBIN UA: NEGATIVE
Glucose, UA: NEGATIVE
Leukocytes, UA: NEGATIVE
NITRITE UA: NEGATIVE
PH UA: 5 (ref 5.0–8.0)
PROTEIN UA: NEGATIVE
SPEC GRAV UA: 1.025 (ref 1.010–1.025)
UROBILINOGEN UA: 0.2 U/dL

## 2018-04-04 MED ORDER — CLINDAMYCIN HCL 300 MG PO CAPS: 300.0000 mg | ORAL_CAPSULE | Freq: Three times a day (TID) | ORAL | 1 refills | Status: DC

## 2018-04-04 MED ORDER — NITROFURANTOIN MONOHYD MACRO 100 MG PO CAPS: 100.0000 mg | ORAL_CAPSULE | Freq: Two times a day (BID) | ORAL | 2 refills | Status: DC

## 2018-04-04 NOTE — Progress Notes (Signed)
Presents for dyspareunia 6/10 x 14 weeks.  C/o bloody discharge, lower abdominal pain 8/10, popping noise during intercourse, nausea, cannot hold her urine x 1 month. She has air bumps on vagina and painful 8/10 bump on lower buttocks x 2 days.

## 2018-04-04 NOTE — Progress Notes (Signed)
Patient ID: Alexandra Meadows, female   DOB: 02-13-1996, 22 y.o.   MRN: [id number]  Chief Complaint  Patient presents with  . Dyspareunia    HPI Alexandra Meadows is a 22 y.o. female.  Bloody vaginal discharge and painful.  Has a boil on buttocks and numerous hair bumps on mons pubis from " shaving with a dull azor ".  Also c/o painful intercourse. HPI  Past Medical History:  Diagnosis Date  . Anemia   . Anxiety   . Asthma    prn inhaler; exercise induced  . Depression    severe at 20weeks  . 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 Infection    BV with preg; recurrent yeast inf  . Migraines   . Seasonal allergies   . Tonsillar hypertrophy 04/2016    Past Surgical History:  Procedure Laterality Date  . KNEE ARTHROSCOPY Left 02/28/2014   Procedure: ARTHROSCOPY LEFT KNEE WITH SYNOVECTOMY LIMITED, PARTIAL LATERAL MENISECTOMY;  Surgeon: Loreta Ave, MD;  Location: [facility];  Service: Orthopedics;  Laterality: Left;  . TONSILLECTOMY AND ADENOIDECTOMY Bilateral 05/25/2016   Procedure: TONSILLECTOMY AND ADENOIDECTOMY;  Surgeon: Newman Pies, MD;  Location: [facility];  Service: ENT;  Laterality: Bilateral;  . WISDOM TOOTH EXTRACTION  2015    Family History  Problem Relation Age of Onset  . Diabetes Mother   . Cancer Maternal Grandfather     Social History Social History   Tobacco Use  . Smoking status: Never Smoker  . Smokeless tobacco: Never Used  . Tobacco comment: quit  Substance Use Topics  . Alcohol use: Yes    Alcohol/week: 0.0 standard drinks    Comment: occ  . Drug use: No    Allergies  Allergen Reactions  . Bee Venom Hives and Swelling  . Tetanus Toxoids Hives  . Xanax [Alprazolam] Other (See Comments)    SEIZURES    Current Outpatient Medications  Medication Sig Dispense Refill  . albuterol (PROVENTIL HFA;VENTOLIN HFA) 108 (90 Base) MCG/ACT inhaler Inhale 2 puffs into the lungs every 4  (four) hours as needed for wheezing or shortness of breath. 1 Inhaler 11  . AMBULATORY NON FORMULARY MEDICATION Bilateral wrist splints 2 Device 0  . clindamycin (CLEOCIN) 300 MG capsule Take 1 capsule (300 mg total) by mouth 3 (three) times daily. 30 capsule 1  . ibuprofen (ADVIL,MOTRIN) 600 MG tablet Take 1 tablet (600 mg total) by mouth every 6 (six) hours as needed for mild pain, moderate pain or cramping. 30 tablet 0  . loratadine (CLARITIN) 10 MG tablet Take 1 tablet (10 mg total) by mouth daily. 30 tablet 11  . nitrofurantoin, macrocrystal-monohydrate, (MACROBID) 100 MG capsule Take 1 capsule (100 mg total) by mouth 2 (two) times daily. 1 po BID x 7days 14 capsule 2  . sertraline (ZOLOFT) 100 MG tablet Take 1 tablet (100 mg total) by mouth daily. 30 tablet 12   No current facility-administered medications for this visit.     Review of Systems Review of Systems Constitutional: negative for fatigue and weight loss Respiratory: negative for cough and wheezing Cardiovascular: negative for chest pain, fatigue and palpitations Gastrointestinal: negative for abdominal pain and change in bowel habits Genitourinary:POSITIVE for bloody vaginal discharge and painful intercourse.  Can't hold urine. Integument/breast: POSITIVE for boil on buttocks and hair bumps on mons pubis Musculoskeletal:negative for myalgias Neurological: negative for gait problems and tremors Behavioral/Psych: negative for abusive relationship, depression  Endocrine: negative for temperature intolerance      Blood pressure 126/75, pulse 90, height 5\' 4"  (1.626 m), weight 154 lb (69.9 kg), currently breastfeeding.  Physical Exam Physical Exam           General:  Alert and no distress Abdomen:  normal findings: no organomegaly, soft, non-tender and no hernia  Pelvis:  External genitalia: normal general appearance Urinary system: urethral meatus normal and bladder without fullness, nontender Vaginal: normal without  tenderness, induration or masses Cervix: normal appearance Adnexa: normal bimanual exam Uterus: anteverted and non-tender, normal size    50% of 15 min visit spent on counseling and coordination of care.   Data Reviewed Urine culture Wet Prep and Cultures  Assessment     1. Vaginal discharge Rx: - Cervicovaginal ancillary only  2. Dysuria Rx: - Urine Culture  3. Boil of buttock Rx: - clindamycin (CLEOCIN) 300 MG capsule; Take 1 capsule (300 mg total) by mouth 3 (three) times daily.  Dispense: 30 capsule; Refill: 1  4. Folliculitis barbae Rx: - clindamycin (CLEOCIN) 300 MG capsule; Take 1 capsule (300 mg total) by mouth 3 (three) times daily.  Dispense: 30 capsule; Refill: 1  5. Acute cystitis with hematuria Rx: - nitrofurantoin, macrocrystal-monohydrate, (MACROBID) 100 MG capsule; Take 1 capsule (100 mg total) by mouth 2 (two) times daily. 1 po BID x 7days  Dispense: 14 capsule; Refill: 2 - POCT Urinalysis Dipstick    Plan    Follow up prn  Orders Placed This Encounter  Procedures  . Urine Culture  . POCT Urinalysis Dipstick   Meds ordered this encounter  Medications  . clindamycin (CLEOCIN) 300 MG capsule    Sig: Take 1 capsule (300 mg total) by mouth 3 (three) times daily.    Dispense:  30 capsule    Refill:  1  . nitrofurantoin, macrocrystal-monohydrate, (MACROBID) 100 MG capsule    Sig: Take 1 capsule (100 mg total) by mouth 2 (two) times daily. 1 po BID x 7days    Dispense:  14 capsule    Refill:  2     Brock Bad MD 04-04-2018

## 2018-04-05 ENCOUNTER — Other Ambulatory Visit: Payer: Self-pay | Admitting: Obstetrics

## 2018-04-05 LAB — CERVICOVAGINAL ANCILLARY ONLY
Bacterial vaginitis: POSITIVE — AB
CANDIDA VAGINITIS: NEGATIVE
Chlamydia: NEGATIVE
NEISSERIA GONORRHEA: NEGATIVE
TRICH (WINDOWPATH): NEGATIVE

## 2018-04-06 LAB — URINE CULTURE

## 2018-04-08 ENCOUNTER — Ambulatory Visit
Admission: RE | Admit: 2018-04-08 | Discharge: 2018-04-08 | Disposition: A | Discharge status: Home / Self Care | Payer: Medicaid Other | Source: Ambulatory Visit | Attending: Neurology | Admitting: Neurology

## 2018-04-08 DIAGNOSIS — R202 Paresthesia of skin: Secondary | ICD-10-CM

## 2018-04-17 ENCOUNTER — Telehealth: Payer: Self-pay | Admitting: Neurology

## 2018-04-17 NOTE — Telephone Encounter (Signed)
-----   Message from Drema DallasAdam R Jaffe, DO sent at 04/10/2018  7:41 PM EDT ----- MRI of cervical spine looks okay.  No explanation for symptoms.  I really don't have any explanation.  We can refer to a hand specialist to see if they can perform steroid injections into the carpal tunnel and see if symptoms improve.

## 2018-04-17 NOTE — Telephone Encounter (Signed)
Mychart message sent to patient.

## 2018-05-31 ENCOUNTER — Encounter: Payer: Self-pay | Admitting: Family Medicine

## 2018-05-31 ENCOUNTER — Ambulatory Visit (INDEPENDENT_AMBULATORY_CARE_PROVIDER_SITE_OTHER): Payer: BLUE CROSS/BLUE SHIELD | Admitting: Family Medicine

## 2018-05-31 VITALS — BP 102/60 | HR 71 | Temp 98.1°F | Wt 151.9 lb

## 2018-05-31 DIAGNOSIS — N390 Urinary tract infection, site not specified: Secondary | ICD-10-CM

## 2018-05-31 DIAGNOSIS — R319 Hematuria, unspecified: Secondary | ICD-10-CM

## 2018-05-31 DIAGNOSIS — R1084 Generalized abdominal pain: Secondary | ICD-10-CM | POA: Diagnosis not present

## 2018-05-31 LAB — POCT URINALYSIS DIPSTICK
BILIRUBIN UA: NEGATIVE
Blood, UA: NEGATIVE
Glucose, UA: NEGATIVE
KETONES UA: NEGATIVE
Leukocytes, UA: NEGATIVE
Nitrite, UA: NEGATIVE
PH UA: 6 (ref 5.0–8.0)
Protein, UA: NEGATIVE
Spec Grav, UA: >=1.03 — AB (ref 1.010–1.025)
Urobilinogen, UA: 0.2 E.U./dL

## 2018-05-31 LAB — POCT URINE PREGNANCY: Preg Test, Ur: NEGATIVE

## 2018-05-31 MED ORDER — CEPHALEXIN 250 MG/5ML PO SUSR: 500.0000 mg | Freq: Three times a day (TID) | ORAL | 0 refills | Status: DC

## 2018-05-31 NOTE — Progress Notes (Signed)
   Subjective:    Patient ID: Alexandra Meadows, female    DOB: 11/23/1995, 22 y.o.   MRN: [id number]  HPI Here for 3 weeks of lower abdominal cramps, low back pain, and urgency to urinate. Now today she passed some blood in the urine. No NVD or fever. She has Nexplanon implants. She is breast feeding. She saw Dr. Montez Morita in GYN on 04-04-18 for biols on the buttocks and for UTI symptoms. She was treated with Cleocin and Macrobid ,and she seemed to get better. A urine culture that day was negative.    Review of Systems  Constitutional: Negative.   Respiratory: Negative.   Cardiovascular: Negative.   Gastrointestinal: Positive for abdominal pain. Negative for abdominal distention, anal bleeding, blood in stool, constipation, diarrhea, nausea, rectal pain and vomiting.  Genitourinary: Positive for dysuria, frequency, hematuria and urgency. Negative for flank pain, vaginal bleeding and vaginal discharge.  Musculoskeletal: Positive for back pain.       Objective:   Physical Exam  Constitutional: She appears well-developed and well-nourished.  Cardiovascular: Normal rate, regular rhythm, normal heart sounds and intact distal pulses.  Pulmonary/Chest: Effort normal and breath sounds normal.  Abdominal: Soft. Bowel sounds are normal. She exhibits no distension and no mass. There is no tenderness. There is no rebound and no guarding. No hernia.          Assessment & Plan:  She has a UTI and we will treat with Keflex for 7 days. Culture the sample. Drink fluids.  Gershon Crane, MD

## 2018-07-16 ENCOUNTER — Encounter (HOSPITAL_COMMUNITY): Payer: Self-pay | Admitting: Emergency Medicine

## 2018-07-16 ENCOUNTER — Other Ambulatory Visit: Payer: Self-pay

## 2018-07-16 ENCOUNTER — Emergency Department (HOSPITAL_COMMUNITY): Payer: BLUE CROSS/BLUE SHIELD

## 2018-07-16 ENCOUNTER — Emergency Department (HOSPITAL_COMMUNITY)
Admission: EM | Admit: 2018-07-16 | Discharge: 2018-07-16 | Disposition: A | Discharge status: Home / Self Care | Payer: BLUE CROSS/BLUE SHIELD | Attending: Emergency Medicine | Admitting: Emergency Medicine

## 2018-07-16 DIAGNOSIS — M546 Pain in thoracic spine: Secondary | ICD-10-CM | POA: Diagnosis present

## 2018-07-16 HISTORY — DX: Carpal tunnel syndrome, unspecified upper limb: G56.00

## 2018-07-16 LAB — I-STAT CHEM 8, ED
BUN: 11 mg/dL (ref 6–20)
CALCIUM ION: 1.25 mmol/L (ref 1.15–1.40)
CREATININE: 0.6 mg/dL (ref 0.44–1.00)
Chloride: 107 mmol/L (ref 98–111)
Glucose, Bld: 94 mg/dL (ref 70–99)
HCT: 40 % (ref 36.0–46.0)
Hemoglobin: 13.6 g/dL (ref 12.0–15.0)
Potassium: 3.9 mmol/L (ref 3.5–5.1)
SODIUM: 141 mmol/L (ref 135–145)
TCO2: 27 mmol/L (ref 22–32)

## 2018-07-16 MED ORDER — IBUPROFEN 200 MG PO TABS
600.0000 mg | ORAL_TABLET | Freq: Once | ORAL | Status: AC
  Administered 2018-07-16: 600 mg via ORAL
  Filled 2018-07-16: qty 3

## 2018-07-16 MED ORDER — ACETAMINOPHEN 325 MG PO TABS
650.0000 mg | ORAL_TABLET | Freq: Once | ORAL | Status: AC
  Administered 2018-07-16: 650 mg via ORAL
  Filled 2018-07-16: qty 2

## 2018-07-16 MED ORDER — HYDROCODONE-ACETAMINOPHEN 5-325 MG PO TABS: 1.0000 | ORAL_TABLET | Freq: Four times a day (QID) | ORAL | 0 refills | Status: DC | PRN

## 2018-07-16 NOTE — ED Provider Notes (Signed)
[facility]-EMERGENCY DEPT Provider Note   CSN: [id number] Arrival date & time: 07/16/18  0620     History   Chief Complaint Chief Complaint  Patient presents with  . Back Pain    HPI Alexandra Meadows is a 22 y.o. female.  HPI  22 year old female presents with upper back pain.  She states that started about a week ago.  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  Since then the pain is worsening and constant.  She is taken ibuprofen, mother's Flexeril, has tried topical treatments and massaging.  However the pain seems to be worsening.  She is currently 6 months postpartum and is breast-feeding.  Has not taken anything this morning.  She has noticed that the pain radiates down both of her arms and there is numbness and tingling down both arms as well.  The right hurts worse than the left.  She has a history of carpal tunnel but this is a little bit different.  No leg numbness or tingling, weakness, or bowel/bladder incontinence.  No direct trauma to her back.  Past Medical History:  Diagnosis Date  . Anemia   . Anxiety   . Asthma    prn inhaler; exercise induced  . Carpal tunnel syndrome   . Depression    severe at 20weeks  . 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 Infection    BV with preg; recurrent yeast inf  . Migraines   . Seasonal allergies   . Tonsillar hypertrophy 04/2016    Patient Active Problem List   Diagnosis Date Noted  . Vaginal bleeding 01/21/2018  . SVD (spontaneous vaginal delivery) 12/27/2017  . Labor and delivery indication for care or intervention 12/26/2017  . [redacted] weeks gestation of pregnancy   . 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 [redacted] weeks gestation of pregnancy   . Small for gestational age   . Uterine size date discrepancy pregnancy, third trimester   . Umbilical vein abnormality affecting pregnancy   . Vitamin D deficiency 07/05/2017  .  Supervision of high-risk pregnancy 06/30/2017  . 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 Anxiety and depression 02/17/2014  . Depression during pregnancy in third trimester 11/29/2013  . INSOMNIA 03/20/2008  . ADHD 08/29/2007  . MIGRAINE, COMMON W/O INTRACTABLE MIGRAINE 03/29/2007    Past Surgical History:  Procedure Laterality Date  . KNEE ARTHROSCOPY Left 02/28/2014   Procedure: ARTHROSCOPY LEFT KNEE WITH SYNOVECTOMY LIMITED, PARTIAL LATERAL MENISECTOMY;  Surgeon: Loreta Aveaniel F Murphy, MD;  Location: [facility];  Service: Orthopedics;  Laterality: Left;  . TONSILLECTOMY AND ADENOIDECTOMY Bilateral 05/25/2016   Procedure: TONSILLECTOMY AND ADENOIDECTOMY;  Surgeon: Newman PiesSu Teoh, MD;  Location: [facility];  Service: ENT;  Laterality: Bilateral;  . WISDOM TOOTH EXTRACTION  2015     OB History    Gravida  2   Para  2   Term  2   Preterm      AB      Living  2     SAB      TAB      Ectopic      Multiple  0   Live Births  2            Home Medications    Prior to Admission medications   Medication Sig Start Date End Date Taking? Authorizing Provider  albuterol (PROVENTIL HFA;VENTOLIN HFA)  108 (90 Base) MCG/ACT inhaler Inhale 2 puffs into the lungs every 4 (four) hours as needed for wheezing or shortness of breath. 11/15/17  Yes Denney, Rachelle A, CNM  Prenatal Vit-Fe Fumarate-FA (PRENATAL PO) Take 3 tablets by mouth daily.   Yes [provider]  HYDROcodone-acetaminophen (NORCO) 5-325 MG tablet Take 1 tablet by mouth every 6 (six) hours as needed for severe pain. 07/16/18   Pricilla LovelessGoldston, Jaylanni Eltringham, MD    Family History Family History  Problem Relation Age of Onset  . Diabetes Mother   . Cancer Maternal Grandfather     Social History Social History   Tobacco Use  . Smoking status: Never Smoker  . Smokeless tobacco: Never Used  . Tobacco comment: quit  Substance Use Topics  . Alcohol use: Yes    Alcohol/week: 0.0 standard drinks    Comment:  occ  . Drug use: No     Allergies   Bee venom; Tetanus toxoids; and Xanax [alprazolam]   Review of Systems Review of Systems  Constitutional: Negative for fever.  Genitourinary:       No incontinence  Musculoskeletal: Positive for back pain. Negative for neck pain.  Neurological: Positive for weakness and numbness.  All other systems reviewed and are negative.    Physical Exam Updated Vital Signs BP 101/63 (BP Location: Left Arm)   Pulse 63   Temp 97.8 F (36.6 C) (Oral)   Resp 17   SpO2 99%   Breastfeeding Yes   Physical Exam Vitals signs and nursing note reviewed.  Constitutional:      General: She is not in acute distress.    Appearance: She is well-developed. She is not diaphoretic.  HENT:     Head: Normocephalic and atraumatic.     Right Ear: External ear normal.     Left Ear: External ear normal.     Nose: Nose normal.  Eyes:     General:        Right eye: No discharge.        Left eye: No discharge.  Cardiovascular:     Rate and Rhythm: Normal rate and regular rhythm.     Heart sounds: Normal heart sounds.  Pulmonary:     Effort: Pulmonary effort is normal.     Breath sounds: Normal breath sounds.  Musculoskeletal:     Cervical back: She exhibits tenderness.       Back:     Comments: Diffuse tenderness to lower C-spine/upper T-spine and surrounding muscular tissue. No swelling  Skin:    General: Skin is warm and dry.  Neurological:     Mental Status: She is alert.     Comments: Equal sensation in BUE. Mildly decreased strength, right > left but unclear if this is due to pain or neurologic weakness  Psychiatric:        Mood and Affect: Mood is not anxious.      ED Treatments / Results  Labs (all labs ordered are listed, but only abnormal results are displayed) Labs Reviewed  I-STAT CHEM 8, ED    EKG None  Radiology Mr Cervical Spine Wo Contrast  Result Date: 07/16/2018 CLINICAL DATA:  Back pain extending into the arms EXAM: MRI  CERVICAL SPINE WITHOUT CONTRAST TECHNIQUE: Multiplanar, multisequence MR imaging of the cervical spine was performed. No intravenous contrast was administered. COMPARISON:  04/08/2018 FINDINGS: Alignment: No vertebral subluxation is observed. Vertebrae: No significant vertebral marrow edema is identified. Cord: No significant abnormal spinal cord signal is observed. Posterior Fossa,  vertebral arteries, paraspinal tissues: Unremarkable Disc levels: No significant degree of cervical spondylosis, degenerative disc disease, or impingement in the cervical spine to explain the patient's symptoms. IMPRESSION: 1. No appreciable abnormality is identified to explain the patient's symptoms. Electronically Signed   By: Gaylyn Rong M.D.   On: 07/16/2018 10:33   Mr Thoracic Spine Wo Contrast  Result Date: 07/16/2018 CLINICAL DATA:  Upper back pain progressively worsening and extending into the upper extremities. EXAM: MRI THORACIC SPINE WITHOUT CONTRAST TECHNIQUE: Multiplanar, multisequence MR imaging of the thoracic spine was performed. No intravenous contrast was administered. COMPARISON:  03/28/2014 radiographs FINDINGS: Alignment:  No vertebral subluxation is observed. Vertebrae: Unremarkable. No significant vertebral marrow edema is identified. Cord:  Unremarkable Paraspinal and other soft tissues: Unremarkable Disc levels: No significant degenerative disc disease or spondylosis. No appreciable thoracic spine impingement. IMPRESSION: 1. MRI appearance of the thoracic spine. No specific abnormality is identified to explain the patient's symptoms. Electronically Signed   By: Gaylyn Rong M.D.   On: 07/16/2018 10:37    Procedures Procedures (including critical care time)  Medications Ordered in ED Medications  ibuprofen (ADVIL,MOTRIN) tablet 600 mg (600 mg Oral Given 07/16/18 0744)  acetaminophen (TYLENOL) tablet 650 mg (650 mg Oral Given 07/16/18 0744)     Initial Impression / Assessment and Plan  / ED Course  I have reviewed the triage vital signs and the nursing notes.  Pertinent labs & imaging results that were available during my care of the patient were reviewed by me and considered in my medical decision making (see chart for details).     Given the back pain with radiation into her arms and numbness/tingling with perceived possible weakness, MRI C and T-spine obtained.  These are unremarkable.  This is probably muscular pain though unclear why she is having the tingling.  Lab work including potassium is benign.  I think this is unlikely to be an acute spinal cord emergency and she appears stable for discharge home with symptomatic care for her back.  She is advised to continue NSAIDs and given her intractable pain I will also give a couple hydrocodone.  We had a discussion about hydrocodone and possible effects on infant through breast-feeding and she understands.  She will given a short course and we discussed signs to watch for such as excessive sleepiness in the baby.  Discussed return precautions and need for follow-up.  Final Clinical Impressions(s) / ED Diagnoses   Final diagnoses:  Acute bilateral thoracic back pain    ED Discharge Orders         Ordered    HYDROcodone-acetaminophen (NORCO) 5-325 MG tablet  Every 6 hours PRN     07/16/18 1100           Pricilla Loveless, MD 07/16/18 1114

## 2018-07-16 NOTE — ED Triage Notes (Signed)
Pt presents with upper back pain that has gotten progressively worse over the past couple of days. 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

## 2018-07-16 NOTE — ED Notes (Signed)
Patient transported to MRI 

## 2018-07-16 NOTE — Discharge Instructions (Addendum)
If you develop worsening or uncontrolled back pain, weakness or numbness in your arms or legs, incontinence of your bowels or bladder, fever, or any other new/concerning symptoms and return to the ER for evaluation.

## 2018-08-23 ENCOUNTER — Ambulatory Visit: Payer: Self-pay | Admitting: Family Medicine

## 2018-08-25 ENCOUNTER — Encounter: Payer: Self-pay | Admitting: Family Medicine

## 2018-08-25 ENCOUNTER — Ambulatory Visit (INDEPENDENT_AMBULATORY_CARE_PROVIDER_SITE_OTHER): Payer: BLUE CROSS/BLUE SHIELD | Admitting: Family Medicine

## 2018-08-25 VITALS — BP 110/68 | HR 74 | Temp 98.3°F | Wt 156.2 lb

## 2018-08-25 DIAGNOSIS — M546 Pain in thoracic spine: Secondary | ICD-10-CM | POA: Diagnosis not present

## 2018-08-25 DIAGNOSIS — J019 Acute sinusitis, unspecified: Secondary | ICD-10-CM

## 2018-08-25 DIAGNOSIS — G8929 Other chronic pain: Secondary | ICD-10-CM | POA: Diagnosis not present

## 2018-08-25 MED ORDER — METHYLPREDNISOLONE ACETATE 80 MG/ML IJ SUSP
120.0000 mg | Freq: Once | INTRAMUSCULAR | Status: AC
Start: 2018-08-25 — End: 2018-08-25
  Administered 2018-08-25: 120 mg via INTRAMUSCULAR

## 2018-08-25 MED ORDER — AZITHROMYCIN 250 MG PO TABS: ORAL_TABLET | ORAL | 0 refills | Status: DC

## 2018-08-25 NOTE — Addendum Note (Signed)
Addended by: Marcellus Scott on: 08/25/2018 10:35 AM   Modules accepted: Orders

## 2018-08-25 NOTE — Progress Notes (Signed)
   Subjective:    Patient ID: Alexandra Meadows, female    DOB: [birth year], 23 y.o.   MRN: [id number]  HPI Here for 2 issues. First she has had sinus pressure, PND, ear pain, and dizziness for one week. No cough or fever. Using Mucinex and Claritin and Sudafed. The second issue is upper back pain. 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 The pain has persisted since then. She went to the ER on 07-16-18 and MRI scans were taken of the cervical and thoracic spines. These were completely unremarkable. She has tried Tylenol and Ibuprofen with little relief. She has been seeing a chiropractor for 4 week with little improvement. The pain is in both sides of the upper back and it radiates down both upper arms. She has continued working with the pain.    Review of Systems  Constitutional: Negative.   HENT: Positive for congestion, ear pain, postnasal drip and sinus pressure. Negative for sinus pain and sore throat.   Eyes: Negative.   Respiratory: Negative.   Musculoskeletal: Positive for back pain.  Neurological: Positive for dizziness.       Objective:   Physical Exam Constitutional:      Appearance: Normal appearance.  HENT:     Right Ear: Tympanic membrane and ear canal normal.     Left Ear: Tympanic membrane and ear canal normal.     Nose: Nose normal.     Mouth/Throat:     Pharynx: Oropharynx is clear.  Eyes:     Conjunctiva/sclera: Conjunctivae normal.  Pulmonary:     Effort: Pulmonary effort is normal.     Breath sounds: Normal breath sounds.  Musculoskeletal:     Comments: She is tender on both sides of the upper thoracic area, no spasm, full ROM   Lymphadenopathy:     Cervical: No cervical adenopathy.  Neurological:     Mental Status: She is alert.           Assessment & Plan:  Treat the sinusitis with a steroid shot and a Zpack. For the thoracic pain we will refer her to  [facility]. Gershon Crane, MD

## 2018-09-13 NOTE — Telephone Encounter (Signed)
error 

## 2018-09-15 ENCOUNTER — Telehealth: Payer: Self-pay | Admitting: Family Medicine

## 2018-09-15 NOTE — Telephone Encounter (Signed)
Left message that Zoloft is not on her medication list. Need to know who prescribed this for her and she will need OV to discuss with him.

## 2018-09-15 NOTE — Telephone Encounter (Signed)
Pt states that zoloft 100mg  1/day was prescribed by Dr. Despina Hidden [facility] during pts pregnancy. Dr. Katherina Right is no longer at the practice and was advised to see PCP since she has had her baby now.   [id number] [place], [place] - [address]   [phone] (Phone) [phone] (Fax)

## 2018-09-15 NOTE — Telephone Encounter (Signed)
Appointment scheduled to discuss continuing medication

## 2018-09-15 NOTE — Telephone Encounter (Signed)
Copied from CRM [id number]. Topic: Quick Communication - Rx Refill/Question >> Sep 15, 2018  1:53 PM Lynne Logan D wrote: Medication: Pt requesting rx for Zoloft. She stated she is no longer able to get it from prescribing physician. Also stated Dr. Clent Ridges prescribed once in the past. Please advise.  Has the patient contacted their pharmacy? No. (Agent: If no, request that the patient contact the pharmacy for the refill.) (Agent: If yes, when and what did the pharmacy advise?)  Preferred Pharmacy (with phone number or street name): [id number] [place], [place] - [address] [phone] (Phone) [phone] (Fax)  Agent: Please be advised that RX refills may take up to 3 business days. We ask that you follow-up with your pharmacy.

## 2018-09-19 ENCOUNTER — Ambulatory Visit (INDEPENDENT_AMBULATORY_CARE_PROVIDER_SITE_OTHER): Payer: BLUE CROSS/BLUE SHIELD | Admitting: Family Medicine

## 2018-09-19 ENCOUNTER — Encounter: Payer: Self-pay | Admitting: Family Medicine

## 2018-09-19 VITALS — BP 122/70 | HR 84 | Temp 98.6°F | Wt 152.1 lb

## 2018-09-19 DIAGNOSIS — F32A Depression, unspecified: Secondary | ICD-10-CM

## 2018-09-19 DIAGNOSIS — F329 Major depressive disorder, single episode, unspecified: Secondary | ICD-10-CM | POA: Diagnosis not present

## 2018-09-19 DIAGNOSIS — F419 Anxiety disorder, unspecified: Secondary | ICD-10-CM | POA: Diagnosis not present

## 2018-09-19 MED ORDER — SERTRALINE HCL 100 MG PO TABS: 100.0000 mg | ORAL_TABLET | Freq: Every day | ORAL | 3 refills | Status: DC

## 2018-09-19 NOTE — Progress Notes (Signed)
   Subjective:    Patient ID: Alexandra Meadows, female    DOB: [birth year], 23 y.o.   MRN: [id number]  HPI Here asking for refills on Zoloft. She had been on this over a year ago and did well. Then she stopped it during her pregnancy and she was off it for over a year. Lately her stress levels have been high because she now has 2 young children and her youngest (now 32 months old) has a lot of health problems. He cannot crawl or sit without assistance, and he will be fitted for leg braces soon. She is working, and her parents help with child care. She is also going through a custody battle with the child's father. Her anxiety has been high and has trouble eating and sleeping. She started herself back on Zoloft 4 weeks ago and she feels much better. She wants to stay on it, and she asks if we can take over prescribing it. Previously her GYN had been doing so.   Review of Systems  Constitutional: Negative.   Respiratory: Negative.   Cardiovascular: Negative.   Neurological: Negative.   Psychiatric/Behavioral: Positive for dysphoric mood and sleep disturbance. Negative for agitation, behavioral problems, confusion, self-injury and suicidal ideas. The patient is nervous/anxious.        Objective:   Physical Exam Constitutional:      Appearance: Normal appearance.  Cardiovascular:     Rate and Rhythm: Normal rate and regular rhythm.     Pulses: Normal pulses.     Heart sounds: Normal heart sounds.  Pulmonary:     Effort: Pulmonary effort is normal.     Breath sounds: Normal breath sounds.  Neurological:     General: No focal deficit present.     Mental Status: She is alert and oriented to person, place, and time.  Psychiatric:        Mood and Affect: Mood normal.        Thought Content: Thought content normal.        Judgment: Judgment normal.           Assessment & Plan:  Anxiety and depression, we will refill the Zoloft at 100 mg daily. Recheck prn.  Gershon Crane, MD

## 2018-12-01 ENCOUNTER — Ambulatory Visit: Payer: BLUE CROSS/BLUE SHIELD | Admitting: Family Medicine

## 2018-12-04 ENCOUNTER — Ambulatory Visit (INDEPENDENT_AMBULATORY_CARE_PROVIDER_SITE_OTHER): Payer: BLUE CROSS/BLUE SHIELD | Admitting: Family Medicine

## 2018-12-04 ENCOUNTER — Other Ambulatory Visit: Payer: Self-pay

## 2018-12-04 ENCOUNTER — Encounter: Payer: Self-pay | Admitting: Family Medicine

## 2018-12-04 DIAGNOSIS — R319 Hematuria, unspecified: Secondary | ICD-10-CM | POA: Diagnosis not present

## 2018-12-04 DIAGNOSIS — N390 Urinary tract infection, site not specified: Secondary | ICD-10-CM

## 2018-12-04 MED ORDER — CIPROFLOXACIN HCL 500 MG PO TABS: 500.0000 mg | ORAL_TABLET | Freq: Two times a day (BID) | ORAL | 0 refills | Status: DC

## 2018-12-04 NOTE — Progress Notes (Signed)
Subjective:    Patient ID: Alexandra Meadows, female    DOB: February 19, 1996, 23 y.o.   MRN: [id number]  HPI Virtual Visit via Video Note  I connected with the patient on 12/04/18 at  9:30 AM EDT by a video enabled telemedicine application and verified that I am speaking with the correct person using two identifiers.  Location patient: home Location provider:work or home office Persons participating in the virtual visit: patient, provider  I discussed the limitations of evaluation and management by telemedicine and the availability of in person appointments. The patient expressed understanding and agreed to proceed.   HPI: Here for 3 weeks of intermittent urgency to urinate with burning, and she has seen blood in the urine at times. She has also had some left sided low back pain. No fever. No nausea or vomiting. No vaginal DC.    ROS: See pertinent positives and negatives per HPI.  Past Medical History:  Diagnosis Date  . Anemia   . Anxiety   . Asthma    prn inhaler; exercise induced  . Carpal tunnel syndrome   . Depression    severe at 20weeks  . 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 Infection    BV with preg; recurrent yeast inf  . Migraines   . Seasonal allergies   . Tonsillar hypertrophy 04/2016    Past Surgical History:  Procedure Laterality Date  . KNEE ARTHROSCOPY Left 02/28/2014   Procedure: ARTHROSCOPY LEFT KNEE WITH SYNOVECTOMY LIMITED, PARTIAL LATERAL MENISECTOMY;  Surgeon: Loreta Ave, MD;  Location: [facility];  Service: Orthopedics;  Laterality: Left;  . TONSILLECTOMY AND ADENOIDECTOMY Bilateral 05/25/2016   Procedure: TONSILLECTOMY AND ADENOIDECTOMY;  Surgeon: Newman Pies, MD;  Location: [facility];  Service: ENT;  Laterality: Bilateral;  . WISDOM TOOTH EXTRACTION  2015    Family History  Problem Relation Age of Onset  . Diabetes Mother   . Cancer Maternal Grandfather      Current  Outpatient Medications:  .  albuterol (PROVENTIL HFA;VENTOLIN HFA) 108 (90 Base) MCG/ACT inhaler, Inhale 2 puffs into the lungs every 4 (four) hours as needed for wheezing or shortness of breath., Disp: 1 Inhaler, Rfl: 11 .  HYDROcodone-acetaminophen (NORCO) 5-325 MG tablet, Take 1 tablet by mouth every 6 (six) hours as needed for severe pain., Disp: 10 tablet, Rfl: 0 .  Prenatal Vit-Fe Fumarate-FA (PRENATAL PO), Take 3 tablets by mouth daily., Disp: , Rfl:  .  sertraline (ZOLOFT) 100 MG tablet, Take 1 tablet (100 mg total) by mouth daily., Disp: 90 tablet, Rfl: 3 .  ciprofloxacin (CIPRO) 500 MG tablet, Take 1 tablet (500 mg total) by mouth 2 (two) times daily., Disp: 14 tablet, Rfl: 0  EXAM:  VITALS per patient if applicable:  GENERAL: alert, oriented, appears well and in no acute distress  HEENT: atraumatic, conjunttiva clear, no obvious abnormalities on inspection of external nose and ears  NECK: normal movements of the head and neck  LUNGS: on inspection no signs of respiratory distress, breathing rate appears normal, no obvious gross SOB, gasping or wheezing  CV: no obvious cyanosis  MS: moves all visible extremities without noticeable abnormality  PSYCH/NEURO: pleasant and cooperative, no obvious depression or anxiety, speech and thought processing grossly intact  ASSESSMENT AND PLAN: UTI, treat with Cipro. Drink lots of water and recheck prn.  Gershon Crane, MD  Discussed the following assessment and plan:  No diagnosis found.  I discussed the assessment and treatment plan with the patient. The patient was provided an opportunity to ask questions and all were answered. The patient agreed with the plan and demonstrated an understanding of the instructions.   The patient was advised to call back or seek an in-person evaluation if the symptoms worsen or if the condition fails to improve as anticipated.     Review of Systems     Objective:   Physical Exam         Assessment & Plan:

## 2019-01-23 ENCOUNTER — Telehealth: Payer: Self-pay | Admitting: Neurology

## 2019-01-23 NOTE — Telephone Encounter (Signed)
Patient left VM about needing to schedule EMG for CTS. Is this correct? She had one last year and hasn't seen Dr. Tomi Likens since. Please advise as I don't see a referral for her. Thanks!

## 2019-01-24 ENCOUNTER — Other Ambulatory Visit: Payer: Self-pay

## 2019-01-24 DIAGNOSIS — G5603 Carpal tunnel syndrome, bilateral upper limbs: Secondary | ICD-10-CM

## 2019-01-24 NOTE — Telephone Encounter (Signed)
Disregard prev msg. We rec referral from Dr. Percell Miller for EMG. Thanks!

## 2019-02-05 ENCOUNTER — Encounter: Payer: Self-pay | Admitting: Family Medicine

## 2019-02-05 ENCOUNTER — Ambulatory Visit (INDEPENDENT_AMBULATORY_CARE_PROVIDER_SITE_OTHER): Payer: BLUE CROSS/BLUE SHIELD | Admitting: Family Medicine

## 2019-02-05 ENCOUNTER — Other Ambulatory Visit: Payer: Self-pay

## 2019-02-05 DIAGNOSIS — J019 Acute sinusitis, unspecified: Secondary | ICD-10-CM

## 2019-02-05 MED ORDER — AMOXICILLIN 500 MG PO TABS: 500.0000 mg | ORAL_TABLET | Freq: Two times a day (BID) | ORAL | 0 refills | Status: DC

## 2019-02-05 NOTE — Progress Notes (Signed)
Virtual Visit via Video Note  I connected with Henleigh Robello on 02/05/19 at  2:30 PM EDT by a video enabled telemedicine application and verified that I am speaking with the correct person using two identifiers.  Location patient: home Location provider:work or home office Persons participating in the virtual visit: patient, provider  I discussed the limitations of evaluation and management by telemedicine and the availability of in person appointments. The patient expressed understanding and agreed to proceed.   HPI: Pt with cold since last Monday (1 wk).  Pt's daughter, also sick with "a cold" was seen by her Pediatrician.  At which time the pt had the provider look in her ears too.  Was told had "fluid behind her ears".  Pt endorses cough, watery eyes, HAs, rhinorrhea, tired, ear pain, facial pain/pressure.  Denies fever, n/v, sore throat.  Pt has taken sudafed q 4 hours, Excedrin, benadryl, airborne, and nasal spray.  Pt's son is also sick.  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  Pt endorses h/o seasonal allergies.    ROS: See pertinent positives and negatives per HPI.  Past Medical History:  Diagnosis Date  . Anemia   . Anxiety   . Asthma    prn inhaler; exercise induced  . Carpal tunnel syndrome   . Depression    severe at 20weeks  . 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 Infection    BV with preg; recurrent yeast inf  . Migraines   . Seasonal allergies   . Tonsillar hypertrophy 04/2016    Past Surgical History:  Procedure Laterality Date  . KNEE ARTHROSCOPY Left 02/28/2014   Procedure: ARTHROSCOPY LEFT KNEE WITH SYNOVECTOMY LIMITED, PARTIAL LATERAL MENISECTOMY;  Surgeon: Ninetta Lights, MD;  Location: [facility];  Service: Orthopedics;  Laterality: Left;  . TONSILLECTOMY AND ADENOIDECTOMY Bilateral 05/25/2016   Procedure: TONSILLECTOMY AND ADENOIDECTOMY;  Surgeon: Leta Baptist, MD;  Location: [facility];  Service: ENT;  Laterality: Bilateral;  . WISDOM TOOTH EXTRACTION  2015    Family History  Problem Relation Age of Onset  . Diabetes Mother   . Cancer Maternal Grandfather      Current Outpatient Medications:  .  albuterol (PROVENTIL HFA;VENTOLIN HFA) 108 (90 Base) MCG/ACT inhaler, Inhale 2 puffs into the lungs every 4 (four) hours as needed for wheezing or shortness of breath., Disp: 1 Inhaler, Rfl: 11 .  ciprofloxacin (CIPRO) 500 MG tablet, Take 1 tablet (500 mg total) by mouth 2 (two) times daily., Disp: 14 tablet, Rfl: 0 .  HYDROcodone-acetaminophen (NORCO) 5-325 MG tablet, Take 1 tablet by mouth every 6 (six) hours as needed for severe pain., Disp: 10 tablet, Rfl: 0 .  Prenatal Vit-Fe Fumarate-FA (PRENATAL PO), Take 3 tablets by mouth daily., Disp: , Rfl:  .  sertraline (ZOLOFT) 100 MG tablet, Take 1 tablet (100 mg total) by mouth daily., Disp: 90 tablet, Rfl: 3  EXAM:  VITALS per patient if applicable: RR between 16-96 bpm  GENERAL: alert, oriented, appears tired but well, and in no acute distress  HEENT: atraumatic, conjunctiva clear, no obvious abnormalities on inspection of external nose and ears  NECK: normal movements of the head and neck  LUNGS: no cough, on inspection no signs of respiratory distress, breathing rate appears normal, no obvious gross SOB, gasping or wheezing  CV: no obvious cyanosis  MS: moves all visible extremities without noticeable abnormality  PSYCH/NEURO:  pleasant and cooperative, no obvious depression or anxiety, speech and thought processing grossly intact  ASSESSMENT AND PLAN:  Discussed the following assessment and plan:  Acute sinusitis, recurrence not specified, unspecified location  -discussed URI vs sinusitis.  Given duration of symptoms will treat for sinusitis. -continue nasal spray.  Ok to take tylenol or ibuprfen prn for pain/discomfort. -pt requesting meds be liquid as wants to avoid the gelatin in capsules as  is a vegetarian.  Pt ok with taking tablets. -given precautions. For worsening or continued symptoms consider COVID 19 testing. - Plan: amoxicillin (AMOXIL) 500 MG tablet  F/u prn   I discussed the assessment and treatment plan with the patient. The patient was provided an opportunity to ask questions and all were answered. The patient agreed with the plan and demonstrated an understanding of the instructions.   The patient was advised to call back or seek an in-person evaluation if the symptoms worsen or if the condition fails to improve as anticipated.  Deeann SaintShannon R Nickolaus Bordelon, MD

## 2019-02-09 ENCOUNTER — Ambulatory Visit (INDEPENDENT_AMBULATORY_CARE_PROVIDER_SITE_OTHER): Payer: BLUE CROSS/BLUE SHIELD | Admitting: Family Medicine

## 2019-02-09 ENCOUNTER — Encounter: Payer: Self-pay | Admitting: Family Medicine

## 2019-02-09 ENCOUNTER — Telehealth: Payer: Self-pay | Admitting: Family Medicine

## 2019-02-09 ENCOUNTER — Other Ambulatory Visit: Payer: Self-pay

## 2019-02-09 VITALS — BP 102/70 | HR 83 | Temp 98.0°F | Ht 64.0 in | Wt 160.2 lb

## 2019-02-09 DIAGNOSIS — Z0000 Encounter for general adult medical examination without abnormal findings: Secondary | ICD-10-CM | POA: Diagnosis not present

## 2019-02-09 LAB — POC URINALSYSI DIPSTICK (AUTOMATED)
Bilirubin, UA: NEGATIVE
Blood, UA: NEGATIVE
Glucose, UA: NEGATIVE
Ketones, UA: NEGATIVE
Leukocytes, UA: NEGATIVE
Nitrite, UA: NEGATIVE
Protein, UA: NEGATIVE
Spec Grav, UA: 1.025 (ref 1.010–1.025)
Urobilinogen, UA: 0.2 E.U./dL
pH, UA: 6 (ref 5.0–8.0)

## 2019-02-09 LAB — CBC WITH DIFFERENTIAL/PLATELET
Basophils Absolute: 0.1 10*3/uL (ref 0.0–0.1)
Basophils Relative: 0.7 % (ref 0.0–3.0)
Eosinophils Absolute: 0.1 10*3/uL (ref 0.0–0.7)
Eosinophils Relative: 1.3 % (ref 0.0–5.0)
HCT: 38.9 % (ref 36.0–46.0)
Hemoglobin: 12.7 g/dL (ref 12.0–15.0)
Lymphocytes Relative: 36.3 % (ref 12.0–46.0)
Lymphs Abs: 3.5 10*3/uL (ref 0.7–4.0)
MCHC: 32.5 g/dL (ref 30.0–36.0)
MCV: 83.7 fl (ref 78.0–100.0)
Monocytes Absolute: 0.4 10*3/uL (ref 0.1–1.0)
Monocytes Relative: 3.7 % (ref 3.0–12.0)
Neutro Abs: 5.6 10*3/uL (ref 1.4–7.7)
Neutrophils Relative %: 58 % (ref 43.0–77.0)
Platelets: 342 10*3/uL (ref 150.0–400.0)
RBC: 4.65 Mil/uL (ref 3.87–5.11)
RDW: 14.3 % (ref 11.5–15.5)
WBC: 9.7 10*3/uL (ref 4.0–10.5)

## 2019-02-09 LAB — BASIC METABOLIC PANEL
BUN: 11 mg/dL (ref 6–23)
CO2: 29 mEq/L (ref 19–32)
Calcium: 9.3 mg/dL (ref 8.4–10.5)
Chloride: 102 mEq/L (ref 96–112)
Creatinine, Ser: 0.61 mg/dL (ref 0.40–1.20)
GFR: 121.26 mL/min (ref >60.00)
Glucose, Bld: 75 mg/dL (ref 70–99)
Potassium: 3.9 mEq/L (ref 3.5–5.1)
Sodium: 141 mEq/L (ref 135–145)

## 2019-02-09 LAB — HEPATIC FUNCTION PANEL
ALT: 18 U/L (ref 0–35)
AST: 17 U/L (ref 0–37)
Albumin: 4.7 g/dL (ref 3.5–5.2)
Alkaline Phosphatase: 90 U/L (ref 39–117)
Bilirubin, Direct: 0.1 mg/dL (ref 0.0–0.3)
Total Bilirubin: 0.5 mg/dL (ref 0.2–1.2)
Total Protein: 7.1 g/dL (ref 6.0–8.3)

## 2019-02-09 LAB — TSH: TSH: 1.21 u[IU]/mL (ref 0.35–4.50)

## 2019-02-09 NOTE — Telephone Encounter (Signed)
Pt was just sen this morning and she said that she forgot to mention to Dr. Sarajane Jews that she needs a rx for Flexeril. She states that she use to take this medication for a muscle relaxer. Please advise. Patient phone number is [phone]

## 2019-02-09 NOTE — Progress Notes (Signed)
   Subjective:    Patient ID: Alexandra Meadows, female    DOB: 09-29-1995, 23 y.o.   MRN: [id number]  HPI Here for a well exam. She is doing well except for some allergy issues. She has stuffy head and ears, PND, and an occasoinal dry cough. No fever or headache or SOB or chest pain or NVD. She is getting cortisone shots to both wrists per Dr. Fredonia Highland for carpal tunnel syndrome.    Review of Systems  Constitutional: Negative.   HENT: Positive for congestion and postnasal drip.   Eyes: Negative.   Respiratory: Negative.   Cardiovascular: Negative.   Gastrointestinal: Negative.   Genitourinary: Negative for decreased urine volume, difficulty urinating, dyspareunia, dysuria, enuresis, flank pain, frequency, hematuria, pelvic pain and urgency.  Musculoskeletal: Negative.   Skin: Negative.   Neurological: Negative.   Psychiatric/Behavioral: Negative.        Objective:   Physical Exam Constitutional:      General: She is not in acute distress.    Appearance: She is well-developed.  HENT:     Head: Normocephalic and atraumatic.     Right Ear: External ear normal.     Left Ear: External ear normal.     Nose: Nose normal.     Mouth/Throat:     Pharynx: No oropharyngeal exudate.  Eyes:     General: No scleral icterus.    Conjunctiva/sclera: Conjunctivae normal.     Pupils: Pupils are equal, round, and reactive to light.  Neck:     Musculoskeletal: Normal range of motion and neck supple.     Thyroid: No thyromegaly.     Vascular: No JVD.  Cardiovascular:     Rate and Rhythm: Normal rate and regular rhythm.     Heart sounds: Normal heart sounds. No murmur. No friction rub. No gallop.   Pulmonary:     Effort: Pulmonary effort is normal. No respiratory distress.     Breath sounds: Normal breath sounds. No wheezing or rales.  Chest:     Chest wall: No tenderness.  Abdominal:     General: Bowel sounds are normal. There is no distension.     Palpations: Abdomen is soft. There  is no mass.     Tenderness: There is no abdominal tenderness. There is no guarding or rebound.  Musculoskeletal: Normal range of motion.        General: No tenderness.  Lymphadenopathy:     Cervical: No cervical adenopathy.  Skin:    General: Skin is warm and dry.     Findings: No erythema or rash.  Neurological:     Mental Status: She is alert and oriented to person, place, and time.     Cranial Nerves: No cranial nerve deficit.     Motor: No abnormal muscle tone.     Coordination: Coordination normal.     Deep Tendon Reflexes: Reflexes are normal and symmetric. Reflexes normal.  Psychiatric:        Behavior: Behavior normal.        Thought Content: Thought content normal.        Judgment: Judgment normal.           Assessment & Plan:  Well exam. We discussed diet and exercise. Get non-fasting labs. For the allergies I suggested she try Flonase and Claritin daily.  Alysia Penna, MD

## 2019-02-14 NOTE — Telephone Encounter (Signed)
Call in Flexeril 10 mg TID prn muscle spasms, #60 with 2 rf 

## 2019-02-14 NOTE — Telephone Encounter (Signed)
[facility] [address]

## 2019-02-20 ENCOUNTER — Encounter: Payer: BLUE CROSS/BLUE SHIELD | Admitting: Neurology

## 2019-03-07 ENCOUNTER — Ambulatory Visit (INDEPENDENT_AMBULATORY_CARE_PROVIDER_SITE_OTHER): Payer: BLUE CROSS/BLUE SHIELD | Admitting: *Deleted

## 2019-03-07 ENCOUNTER — Other Ambulatory Visit: Payer: Self-pay

## 2019-03-07 ENCOUNTER — Encounter: Payer: Self-pay | Admitting: Family Medicine

## 2019-03-07 DIAGNOSIS — Z111 Encounter for screening for respiratory tuberculosis: Secondary | ICD-10-CM | POA: Diagnosis not present

## 2019-03-07 DIAGNOSIS — Z23 Encounter for immunization: Secondary | ICD-10-CM

## 2019-03-09 ENCOUNTER — Other Ambulatory Visit: Payer: Self-pay

## 2019-03-09 LAB — TB SKIN TEST
Induration: 0 mm
TB Skin Test: NEGATIVE

## 2019-04-09 ENCOUNTER — Ambulatory Visit (INDEPENDENT_AMBULATORY_CARE_PROVIDER_SITE_OTHER): Payer: BLUE CROSS/BLUE SHIELD | Admitting: Family Medicine

## 2019-04-09 ENCOUNTER — Encounter (HOSPITAL_COMMUNITY): Payer: Self-pay | Admitting: Emergency Medicine

## 2019-04-09 ENCOUNTER — Other Ambulatory Visit: Payer: Self-pay

## 2019-04-09 ENCOUNTER — Emergency Department (HOSPITAL_COMMUNITY)
Admission: EM | Admit: 2019-04-09 | Discharge: 2019-04-09 | Disposition: A | Discharge status: Home / Self Care | Payer: BLUE CROSS/BLUE SHIELD | Attending: Emergency Medicine | Admitting: Emergency Medicine

## 2019-04-09 ENCOUNTER — Encounter: Payer: Self-pay | Admitting: Family Medicine

## 2019-04-09 ENCOUNTER — Emergency Department (HOSPITAL_COMMUNITY): Payer: BLUE CROSS/BLUE SHIELD

## 2019-04-09 VITALS — BP 122/82 | Temp 99.0°F | Wt 162.0 lb

## 2019-04-09 DIAGNOSIS — R109 Unspecified abdominal pain: Secondary | ICD-10-CM

## 2019-04-09 DIAGNOSIS — J45909 Unspecified asthma, uncomplicated: Secondary | ICD-10-CM | POA: Insufficient documentation

## 2019-04-09 DIAGNOSIS — R310 Gross hematuria: Secondary | ICD-10-CM | POA: Diagnosis not present

## 2019-04-09 DIAGNOSIS — Z793 Long term (current) use of hormonal contraceptives: Secondary | ICD-10-CM | POA: Insufficient documentation

## 2019-04-09 DIAGNOSIS — R1030 Lower abdominal pain, unspecified: Secondary | ICD-10-CM

## 2019-04-09 DIAGNOSIS — N939 Abnormal uterine and vaginal bleeding, unspecified: Secondary | ICD-10-CM

## 2019-04-09 DIAGNOSIS — R1032 Left lower quadrant pain: Secondary | ICD-10-CM | POA: Diagnosis present

## 2019-04-09 LAB — URINALYSIS, ROUTINE W REFLEX MICROSCOPIC
Bilirubin Urine: NEGATIVE
Glucose, UA: NEGATIVE mg/dL
Ketones, ur: NEGATIVE mg/dL
Leukocytes,Ua: NEGATIVE
Nitrite: NEGATIVE
Protein, ur: NEGATIVE mg/dL
Specific Gravity, Urine: 1.012 (ref 1.005–1.030)
pH: 5 (ref 5.0–8.0)

## 2019-04-09 LAB — CBC WITH DIFFERENTIAL/PLATELET
Abs Immature Granulocytes: 0.02 10*3/uL (ref 0.00–0.07)
Basophils Absolute: 0 10*3/uL (ref 0.0–0.1)
Basophils Relative: 0 %
Eosinophils Absolute: 0.1 10*3/uL (ref 0.0–0.5)
Eosinophils Relative: 1 %
HCT: 39.2 % (ref 36.0–46.0)
Hemoglobin: 12.5 g/dL (ref 12.0–15.0)
Immature Granulocytes: 0 %
Lymphocytes Relative: 39 %
Lymphs Abs: 3.5 10*3/uL (ref 0.7–4.0)
MCH: 26.9 pg (ref 26.0–34.0)
MCHC: 31.9 g/dL (ref 30.0–36.0)
MCV: 84.5 fL (ref 80.0–100.0)
Monocytes Absolute: 0.4 10*3/uL (ref 0.1–1.0)
Monocytes Relative: 4 %
Neutro Abs: 4.9 10*3/uL (ref 1.7–7.7)
Neutrophils Relative %: 56 %
Platelets: 342 10*3/uL (ref 150–400)
RBC: 4.64 MIL/uL (ref 3.87–5.11)
RDW: 13.6 % (ref 11.5–15.5)
WBC: 9 10*3/uL (ref 4.0–10.5)
nRBC: 0 % (ref 0.0–0.2)

## 2019-04-09 LAB — LIPASE, BLOOD: Lipase: 37 U/L (ref 11–51)

## 2019-04-09 LAB — POC URINALSYSI DIPSTICK (AUTOMATED)
Bilirubin, UA: NEGATIVE
Glucose, UA: NEGATIVE
Ketones, UA: NEGATIVE
Leukocytes, UA: NEGATIVE
Nitrite, UA: NEGATIVE
Protein, UA: POSITIVE — AB
Spec Grav, UA: 1.025 (ref 1.010–1.025)
pH, UA: 5.5 (ref 5.0–8.0)

## 2019-04-09 LAB — COMPREHENSIVE METABOLIC PANEL
ALT: 27 U/L (ref 0–44)
AST: 23 U/L (ref 15–41)
Albumin: 4.2 g/dL (ref 3.5–5.0)
Alkaline Phosphatase: 82 U/L (ref 38–126)
Anion gap: 9 (ref 5–15)
BUN: 5 mg/dL — ABNORMAL LOW (ref 6–20)
CO2: 27 mmol/L (ref 22–32)
Calcium: 9.3 mg/dL (ref 8.9–10.3)
Chloride: 104 mmol/L (ref 98–111)
Creatinine, Ser: 0.61 mg/dL (ref 0.44–1.00)
GFR calc Af Amer: >60 mL/min (ref >60)
GFR calc non Af Amer: >60 mL/min (ref >60)
Glucose, Bld: 85 mg/dL (ref 70–99)
Potassium: 3.8 mmol/L (ref 3.5–5.1)
Sodium: 140 mmol/L (ref 135–145)
Total Bilirubin: 0.6 mg/dL (ref 0.3–1.2)
Total Protein: 7.1 g/dL (ref 6.5–8.1)

## 2019-04-09 LAB — POCT URINE PREGNANCY: Preg Test, Ur: NEGATIVE

## 2019-04-09 LAB — I-STAT BETA HCG BLOOD, ED (MC, WL, AP ONLY): I-stat hCG, quantitative: <5 m[IU]/mL (ref <5)

## 2019-04-09 MED ORDER — NEXPLANON 68 MG ~~LOC~~ IMPL: 1.0000 | DRUG_IMPLANT | Freq: Once | SUBCUTANEOUS | 0 refills | Status: DC

## 2019-04-09 MED ORDER — KETOROLAC TROMETHAMINE 15 MG/ML IJ SOLN
15.0000 mg | Freq: Once | INTRAMUSCULAR | Status: AC
  Administered 2019-04-09: 15 mg via INTRAVENOUS
  Filled 2019-04-09: qty 1

## 2019-04-09 MED ORDER — PHENAZOPYRIDINE HCL 200 MG PO TABS: 200.0000 mg | ORAL_TABLET | Freq: Three times a day (TID) | ORAL | 0 refills | Status: DC

## 2019-04-09 MED ORDER — MORPHINE SULFATE (PF) 4 MG/ML IV SOLN: 5.0000 mg | Freq: Once | INTRAVENOUS | Status: DC

## 2019-04-09 MED ORDER — MORPHINE SULFATE (PF) 4 MG/ML IV SOLN
4.0000 mg | Freq: Once | INTRAVENOUS | Status: AC
  Administered 2019-04-09: 4 mg via INTRAVENOUS
  Filled 2019-04-09: qty 1

## 2019-04-09 MED ORDER — NAPROXEN 500 MG PO TABS: 500.0000 mg | ORAL_TABLET | Freq: Two times a day (BID) | ORAL | 0 refills | Status: DC

## 2019-04-09 MED ORDER — SODIUM CHLORIDE 0.9 % IV SOLN: INTRAVENOUS | Status: DC

## 2019-04-09 MED ORDER — IOHEXOL 300 MG/ML  SOLN
100.0000 mL | Freq: Once | INTRAMUSCULAR | Status: AC | PRN
  Administered 2019-04-09: 100 mL via INTRAVENOUS

## 2019-04-09 NOTE — ED Triage Notes (Signed)
Pt sent here by Md office for r/o appendicitis ,abd pain for 2 days with blood in her urine

## 2019-04-09 NOTE — ED Provider Notes (Signed)
Patient's CT comes back negative for an acute abdominal process.  No evidence of kidney stone or abnormality in the reproductive system.  Patient states her pain is just gradually worsened over a few days and is not classic for ovarian torsion.  Patient has not been sexually active for greater than 6 months and low suspicion that this is from STD.  Patient has Nexplanon and does not have regular menses but is not pregnant today.  Concern for possible interstitial cystitis this patient does have blood in her urine versus infection but patient has no white cells and only rare bacteria at this time.  Recommended patient follow-up with urology and her PCP.  Given naproxen and Pyridium.   Blanchie Dessert, MD 04/09/19 1810

## 2019-04-09 NOTE — ED Notes (Signed)
Patient transported to CT 

## 2019-04-09 NOTE — Addendum Note (Signed)
Addended by: Gwenyth Ober R on: 04/09/2019 05:44 PM   Modules accepted: Orders

## 2019-04-09 NOTE — Progress Notes (Signed)
   Subjective:    Patient ID: Alexandra Meadows, female    DOB: 07-04-1996, 23 y.o.   MRN: [id number]  HPI Here for 2 days of lower abdominal pain, nausea without vomiting, and a bloody vaginal DC. No fever. No urinary urgency or burning. Her LMP was 15 months ago but she has Nexplanon in place and she just stopped breast feeding her youngest child 2 months ago. She has not had sex for many months.    Review of Systems  Constitutional: Negative.   Respiratory: Negative.   Cardiovascular: Negative.   Gastrointestinal: Positive for abdominal pain and nausea. Negative for abdominal distention, anal bleeding, blood in stool, constipation, diarrhea, rectal pain and vomiting.  Genitourinary: Positive for flank pain, vaginal bleeding and vaginal discharge. Negative for dysuria, frequency and urgency.       Objective:   Physical Exam Constitutional:      Appearance: Normal appearance. She is well-developed. She is not ill-appearing.  Cardiovascular:     Rate and Rhythm: Normal rate and regular rhythm.     Pulses: Normal pulses.     Heart sounds: Normal heart sounds.  Pulmonary:     Effort: Pulmonary effort is normal.     Breath sounds: Normal breath sounds.  Abdominal:     General: Abdomen is flat. Bowel sounds are normal. There is no distension.     Palpations: Abdomen is soft. There is no mass.     Tenderness: There is abdominal tenderness. There is rebound. There is no guarding.     Hernia: No hernia is present.  Lymphadenopathy:     Cervical: No cervical adenopathy.  Neurological:     Mental Status: She is alert.           Assessment & Plan:  Abdominal pain with vaginal bleeding, anorexia, and nausea. This does not appear to be a typical UTI. We must consider other etiologies such as PID or appendicitis. She will drive herself directly to [facility] ER now for further evaluation.  Alysia Penna, MD

## 2019-04-09 NOTE — ED Provider Notes (Signed)
[facility] EMERGENCY DEPARTMENT Provider Note   CSN: [id number] Arrival date & time: 04/09/19  1206     History   Chief Complaint No chief complaint on file.   HPI Alexandra Meadows is a 23 y.o. female.     23 year old female who presents with abdominal discomfort which is characterizes diffuse and now has migrated to her left lower quadrant.  It also radiates to both flanks.  She has had some dysuria but denies any hematuria.  Does have a history of urinary tract infections in the past.  Saw her physician today and there was concern for possible appendicitis.  She denies any fever or chills.  Patient states that she has not been sexually active in over a year.  Has had some dysfunctional uterine bleeding.  Was currently on Depo-Provera shots.  Symptoms have been persistent nothing makes them better worse no treatment used prior to arrival.     Past Medical History:  Diagnosis Date  . Anemia   . Anxiety   . Asthma    prn inhaler; exercise induced  . Carpal tunnel syndrome   . Depression    severe at 20weeks  . 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 Infection    BV with preg; recurrent yeast inf  . Migraines   . Seasonal allergies   . Tonsillar hypertrophy 04/2016    Patient Active Problem List   Diagnosis Date Noted  . Chronic bilateral thoracic back pain 08/25/2018  . Vaginal bleeding 01/21/2018  . SVD (spontaneous vaginal delivery) 12/27/2017  . 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 Small for gestational age   . Uterine size date discrepancy pregnancy, third trimester   . Umbilical vein abnormality affecting pregnancy   . Vitamin D deficiency 07/05/2017  . Supervision of high-risk pregnancy 06/30/2017  . 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 Anxiety and depression 02/17/2014  . Depression during pregnancy in third trimester 11/29/2013  . INSOMNIA 03/20/2008  . ADHD 08/29/2007  .  MIGRAINE, COMMON W/O INTRACTABLE MIGRAINE 03/29/2007    Past Surgical History:  Procedure Laterality Date  . KNEE ARTHROSCOPY Left 02/28/2014   Procedure: ARTHROSCOPY LEFT KNEE WITH SYNOVECTOMY LIMITED, PARTIAL LATERAL MENISECTOMY;  Surgeon: Loreta Ave, MD;  Location: [facility];  Service: Orthopedics;  Laterality: Left;  . TONSILLECTOMY AND ADENOIDECTOMY Bilateral 05/25/2016   Procedure: TONSILLECTOMY AND ADENOIDECTOMY;  Surgeon: Newman Pies, MD;  Location: [facility];  Service: ENT;  Laterality: Bilateral;  . WISDOM TOOTH EXTRACTION  2015     OB History    Gravida  2   Para  2   Term  2   Preterm      AB      Living  2     SAB      TAB      Ectopic      Multiple  0   Live Births  2            Home Medications    Prior to Admission medications   Medication Sig Start Date End Date Taking? Authorizing Provider  albuterol (PROVENTIL HFA;VENTOLIN HFA) 108 (90 Base) MCG/ACT inhaler Inhale 2 puffs into the lungs every 4 (four) hours as needed for wheezing or shortness of breath. 11/15/17   Roe Coombs, CNM  etonogestrel (NEXPLANON) 68 MG IMPL implant 1 each (68 mg total) by Subdermal route once for 1 dose. 04/09/19  04/09/19  Laurey Morale, MD  Prenatal Vit-Fe Fumarate-FA (PRENATAL PO) Take 3 tablets by mouth daily.    [provider]  sertraline (ZOLOFT) 100 MG tablet Take 1 tablet (100 mg total) by mouth daily. Patient not taking: Reported on 02/09/2019 09/19/18   Laurey Morale, MD    Family History Family History  Problem Relation Age of Onset  . Diabetes Mother   . Cancer Maternal Grandfather     Social History Social History   Tobacco Use  . Smoking status: Never Smoker  . Smokeless tobacco: Never Used  . Tobacco comment: quit  Substance Use Topics  . Alcohol use: Yes    Alcohol/week: 0.0 standard drinks    Comment: occ  . Drug use: No     Allergies   Bee venom, Tetanus toxoids, and Xanax [alprazolam]    Review of Systems Review of Systems  All other systems reviewed and are negative.    Physical Exam Updated Vital Signs BP (!) 143/95 (BP Location: Left Arm)   Pulse 93   Temp 98.5 F (36.9 C) (Oral)   Resp 18   SpO2 100%   Physical Exam Vitals signs and nursing note reviewed.  Constitutional:      General: She is not in acute distress.    Appearance: Normal appearance. She is well-developed. She is not toxic-appearing.  HENT:     Head: Normocephalic and atraumatic.  Eyes:     General: Lids are normal.     Conjunctiva/sclera: Conjunctivae normal.     Pupils: Pupils are equal, round, and reactive to light.  Neck:     Musculoskeletal: Normal range of motion and neck supple.     Thyroid: No thyroid mass.     Trachea: No tracheal deviation.  Cardiovascular:     Rate and Rhythm: Normal rate and regular rhythm.     Heart sounds: Normal heart sounds. No murmur. No gallop.   Pulmonary:     Effort: Pulmonary effort is normal. No respiratory distress.     Breath sounds: Normal breath sounds. No stridor. No decreased breath sounds, wheezing, rhonchi or rales.  Abdominal:     General: Bowel sounds are normal. There is no distension.     Palpations: Abdomen is soft.     Tenderness: There is abdominal tenderness in the right lower quadrant. There is guarding. There is no rebound. Negative signs include Rovsing's sign.    Musculoskeletal: Normal range of motion.        General: No tenderness.  Skin:    General: Skin is warm and dry.     Findings: No abrasion or rash.  Neurological:     Mental Status: She is alert and oriented to person, place, and time.     GCS: GCS eye subscore is 4. GCS verbal subscore is 5. GCS motor subscore is 6.     Cranial Nerves: No cranial nerve deficit.     Sensory: No sensory deficit.  Psychiatric:        Speech: Speech normal.        Behavior: Behavior normal.      ED Treatments / Results  Labs (all labs ordered are listed, but only  abnormal results are displayed) Labs Reviewed  COMPREHENSIVE METABOLIC PANEL  LIPASE, BLOOD  CBC WITH DIFFERENTIAL/PLATELET  URINALYSIS, ROUTINE W REFLEX MICROSCOPIC  I-STAT BETA HCG BLOOD, ED ([facility], [facility], [facility] ONLY)    EKG None  Radiology No results found.  Procedures Procedures (including critical care time)  Medications  Ordered in ED Medications  0.9 %  sodium chloride infusion (has no administration in time range)     Initial Impression / Assessment and Plan / ED Course  I have reviewed the triage vital signs and the nursing notes.  Pertinent labs & imaging results that were available during my care of the patient were reviewed by me and considered in my medical decision making (see chart for details).        Patient medicated for pain here.  Labs and urinalysis noted. Abdominal CT ordered for possible appendicitis.  Care assigned to Dr. Anitra LauthPlunkett  Final Clinical Impressions(s) / ED Diagnoses   Final diagnoses:  None    ED Discharge Orders    None       Lorre NickAllen, Ren Grasse, MD 04/09/19 1627

## 2019-04-10 LAB — URINE CULTURE: Culture: NO GROWTH

## 2019-04-13 ENCOUNTER — Ambulatory Visit: Payer: BLUE CROSS/BLUE SHIELD | Admitting: Family Medicine

## 2019-04-18 ENCOUNTER — Inpatient Hospital Stay: Payer: BLUE CROSS/BLUE SHIELD | Admitting: Family Medicine

## 2019-04-18 ENCOUNTER — Other Ambulatory Visit: Payer: Self-pay

## 2019-04-30 ENCOUNTER — Other Ambulatory Visit: Payer: Self-pay

## 2019-04-30 ENCOUNTER — Encounter: Payer: Self-pay | Admitting: Family Medicine

## 2019-04-30 ENCOUNTER — Telehealth (INDEPENDENT_AMBULATORY_CARE_PROVIDER_SITE_OTHER): Payer: BLUE CROSS/BLUE SHIELD | Admitting: Family Medicine

## 2019-04-30 DIAGNOSIS — R1030 Lower abdominal pain, unspecified: Secondary | ICD-10-CM

## 2019-04-30 DIAGNOSIS — R319 Hematuria, unspecified: Secondary | ICD-10-CM

## 2019-04-30 DIAGNOSIS — L309 Dermatitis, unspecified: Secondary | ICD-10-CM

## 2019-04-30 MED ORDER — TRIAMCINOLONE ACETONIDE 0.1 % EX CREA: 1.0000 "application " | TOPICAL_CREAM | Freq: Two times a day (BID) | CUTANEOUS | 2 refills | Status: DC

## 2019-04-30 NOTE — Progress Notes (Signed)
Virtual Visit via Video Note  I connected with the patient on 04/30/19 at  3:00 PM EDT by a video enabled telemedicine application and verified that I am speaking with the correct person using two identifiers.  Location patient: home Location provider:work or home office Persons participating in the virtual visit: patient, provider  I discussed the limitations of evaluation and management by telemedicine and the availability of in person appointments. The patient expressed understanding and agreed to proceed.   HPI: Here for several issues. First she is following up on an ER visit on 04-09-19 for lower abdominal pain and hematuria. We saw her here in the clinic that day and were concerned about possible appendicitis. At the ER her labs were normal and the CT scan was also normal. They felt interstitial cystitis was a possible etiology and she was sent home on Naproxen and Pyridium. The urine culture was negative. About a week after that she saw Lanna Poche, her GYN, and she performed a pelvic exam that was unremarkable. She also removed the Nexplanon that was implanted in her arm. Since then Alexandra Meadows feels better but she still has some lower abdominal pains. Also last week she began to develop spots of itchy red skin on the arms, legs, and trunk. She thought this could be ringworm and she has been applying an OTC ringworm cream, but this has not helped.    ROS: See pertinent positives and negatives per HPI.  Past Medical History:  Diagnosis Date  . Anemia   . Anxiety   . Asthma    prn inhaler; exercise induced  . Carpal tunnel syndrome   . Depression    severe at 20weeks  . 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 Infection    BV with preg; recurrent yeast inf  . Migraines   . Seasonal allergies   . Tonsillar hypertrophy 04/2016    Past Surgical History:  Procedure Laterality Date  . KNEE ARTHROSCOPY Left 02/28/2014   Procedure: ARTHROSCOPY LEFT  KNEE WITH SYNOVECTOMY LIMITED, PARTIAL LATERAL MENISECTOMY;  Surgeon: Ninetta Lights, MD;  Location: [facility];  Service: Orthopedics;  Laterality: Left;  . TONSILLECTOMY AND ADENOIDECTOMY Bilateral 05/25/2016   Procedure: TONSILLECTOMY AND ADENOIDECTOMY;  Surgeon: Leta Baptist, MD;  Location: [facility];  Service: ENT;  Laterality: Bilateral;  . WISDOM TOOTH EXTRACTION  2015    Family History  Problem Relation Age of Onset  . Diabetes Mother   . Cancer Maternal Grandfather      Current Outpatient Medications:  .  albuterol (PROVENTIL HFA;VENTOLIN HFA) 108 (90 Base) MCG/ACT inhaler, Inhale 2 puffs into the lungs every 4 (four) hours as needed for wheezing or shortness of breath., Disp: 1 Inhaler, Rfl: 11 .  etonogestrel (NEXPLANON) 68 MG IMPL implant, 1 each (68 mg total) by Subdermal route once for 1 dose., Disp: 1 each, Rfl: 0 .  naproxen (NAPROSYN) 500 MG tablet, Take 1 tablet (500 mg total) by mouth 2 (two) times daily., Disp: 20 tablet, Rfl: 0 .  phenazopyridine (PYRIDIUM) 200 MG tablet, Take 1 tablet (200 mg total) by mouth 3 (three) times daily., Disp: 6 tablet, Rfl: 0 .  Prenatal Vit-Fe Fumarate-FA (PRENATAL PO), Take 3 tablets by mouth daily., Disp: , Rfl:  .  sertraline (ZOLOFT) 100 MG tablet, Take 1 tablet (100 mg total) by mouth daily. (Patient not taking: Reported on 02/09/2019), Disp: 90 tablet, Rfl: 3  EXAM: SKIN: she  has 6 or 7 1 cm macular or annular red scaly spots of skin as above.  VITALS per patient if applicable:  GENERAL: alert, oriented, appears well and in no acute distress  HEENT: atraumatic, conjunttiva clear, no obvious abnormalities on inspection of external nose and ears  NECK: normal movements of the head and neck  LUNGS: on inspection no signs of respiratory distress, breathing rate appears normal, no obvious gross SOB, gasping or wheezing  CV: no obvious cyanosis  MS: moves all visible extremities without noticeable  abnormality  PSYCH/NEURO: pleasant and cooperative, no obvious depression or anxiety, speech and thought processing grossly intact  ASSESSMENT AND PLAN: Her abdominal pain and hematuria could be related to IC, so we will refer her to Urology. 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  Gershon Crane, MD  Discussed the following assessment and plan:  No diagnosis found.     I discussed the assessment and treatment plan with the patient. The patient was provided an opportunity to ask questions and all were answered. The patient agreed with the plan and demonstrated an understanding of the instructions.   The patient was advised to call back or seek an in-person evaluation if the symptoms worsen or if the condition fails to improve as anticipated.

## 2019-10-05 ENCOUNTER — Telehealth (INDEPENDENT_AMBULATORY_CARE_PROVIDER_SITE_OTHER): Payer: Self-pay | Admitting: Family Medicine

## 2019-10-05 ENCOUNTER — Other Ambulatory Visit: Payer: Self-pay

## 2019-10-05 DIAGNOSIS — Z308 Encounter for other contraceptive management: Secondary | ICD-10-CM

## 2019-10-05 DIAGNOSIS — L700 Acne vulgaris: Secondary | ICD-10-CM

## 2019-10-05 MED ORDER — DROSPIRENONE-ETHINYL ESTRADIOL 3-0.02 MG PO TABS: 1.0000 | ORAL_TABLET | Freq: Every day | ORAL | 11 refills | Status: DC

## 2019-10-05 NOTE — Progress Notes (Signed)
Virtual Visit via Telephone Note  I connected with the patient on 10/05/19 at 11:15 AM EST by telephone and verified that I am speaking with the correct person using two identifiers.   I discussed the limitations, risks, security and privacy concerns of performing an evaluation and management service by telephone and the availability of in person appointments. I also discussed with the patient that there may be a patient responsible charge related to this service. The patient expressed understanding and agreed to proceed.  Location patient: home Location provider: work or home office Participants present for the call: patient, provider Patient did not have a visit in the prior 7 days to address this/these issue(s).   History of Present Illness: Here to discuss acne. After her son was born her GYN implanted Nexplanon in her arm, and she had this in place for 18 months. Then she grew tired of it and had it removed about 5 months ago. Since then her face has been breaking out with terrible acne, and she wants to get back on oral BCP. Her rmenses are normal and regular.    Observations/Objective: Patient sounds cheerful and well on the phone. I do not appreciate any SOB. Speech and thought processing are grossly intact. Patient reported vitals:  Assessment and Plan: Facial acne and birth control. She will restart on Yaz daily. Recheck as needed. Gershon Crane, MD    Follow Up Instructions:     [id number] 5-10 [phone] 11-20 [id number] 21-30 I did not refer this patient for an OV in the next 24 hours for this/these issue(s).  I discussed the assessment and treatment plan with the patient. The patient was provided an opportunity to ask questions and all were answered. The patient agreed with the plan and demonstrated an understanding of the instructions.   The patient was advised to call back or seek an in-person evaluation if the symptoms worsen or if the condition fails to improve as  anticipated.  I provided 13 minutes of non-face-to-face time during this encounter.   Gershon Crane, MD

## 2019-10-12 ENCOUNTER — Other Ambulatory Visit: Payer: Self-pay

## 2019-10-15 ENCOUNTER — Ambulatory Visit (INDEPENDENT_AMBULATORY_CARE_PROVIDER_SITE_OTHER): Payer: 59 | Admitting: Family Medicine

## 2019-10-15 ENCOUNTER — Encounter: Payer: Self-pay | Admitting: Family Medicine

## 2019-10-15 VITALS — BP 112/78 | HR 78 | Temp 98.0°F | Ht 64.57 in | Wt 166.5 lb

## 2019-10-15 DIAGNOSIS — Z0000 Encounter for general adult medical examination without abnormal findings: Secondary | ICD-10-CM

## 2019-10-15 DIAGNOSIS — Z209 Contact with and (suspected) exposure to unspecified communicable disease: Secondary | ICD-10-CM

## 2019-10-15 MED ORDER — DROSPIRENONE-ETHINYL ESTRADIOL 3-0.02 MG PO TABS: 1.0000 | ORAL_TABLET | Freq: Every day | ORAL | 11 refills | Status: DC

## 2019-10-15 MED ORDER — BUTALBITAL-ASPIRIN-CAFFEINE 50-325-40 MG PO CAPS: 1.0000 | ORAL_CAPSULE | Freq: Four times a day (QID) | ORAL | 1 refills | Status: DC | PRN

## 2019-10-15 MED ORDER — CYCLOBENZAPRINE HCL 10 MG PO TABS: 10.0000 mg | ORAL_TABLET | Freq: Three times a day (TID) | ORAL | 5 refills | Status: DC | PRN

## 2019-10-15 NOTE — Progress Notes (Signed)
   Subjective:    Patient ID: Alexandra Meadows, female    DOB: [birth year], 24 y.o.   MRN: [id number]  HPI Here for a well exam. 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 She feels well except for tension headaches. These begin in the posterior neck and then generalize over the head. She gets 1 or 2 of these a week. She has been using Ibuprofen and Flexeril for them, but she does not get much relief.    Review of Systems  Constitutional: Negative.   HENT: Negative.   Eyes: Negative.   Respiratory: Negative.   Cardiovascular: Negative.   Gastrointestinal: Negative.   Genitourinary: Negative for decreased urine volume, difficulty urinating, dyspareunia, dysuria, enuresis, flank pain, frequency, hematuria, pelvic pain and urgency.  Musculoskeletal: Negative.   Skin: Negative.   Neurological: Positive for headaches.  Psychiatric/Behavioral: Negative.        Objective:   Physical Exam Constitutional:      General: She is not in acute distress.    Appearance: She is well-developed.  HENT:     Head: Normocephalic and atraumatic.     Right Ear: External ear normal.     Left Ear: External ear normal.     Nose: Nose normal.     Mouth/Throat:     Pharynx: No oropharyngeal exudate.  Eyes:     General: No scleral icterus.    Conjunctiva/sclera: Conjunctivae normal.     Pupils: Pupils are equal, round, and reactive to light.  Neck:     Thyroid: No thyromegaly.     Vascular: No JVD.  Cardiovascular:     Rate and Rhythm: Normal rate and regular rhythm.     Heart sounds: Normal heart sounds. No murmur. No friction rub. No gallop.   Pulmonary:     Effort: Pulmonary effort is normal. No respiratory distress.     Breath sounds: Normal breath sounds. No wheezing or rales.  Chest:     Chest wall: No tenderness.  Abdominal:     General: Bowel sounds are normal. There is no distension.     Palpations: Abdomen is soft.  There is no mass.     Tenderness: There is no abdominal tenderness. There is no guarding or rebound.  Musculoskeletal:        General: No tenderness. Normal range of motion.     Cervical back: Normal range of motion and neck supple.  Lymphadenopathy:     Cervical: No cervical adenopathy.  Skin:    General: Skin is warm and dry.     Findings: No erythema or rash.  Neurological:     Mental Status: She is alert and oriented to person, place, and time.     Cranial Nerves: No cranial nerve deficit.     Motor: No abnormal muscle tone.     Coordination: Coordination normal.     Deep Tendon Reflexes: Reflexes are normal and symmetric. Reflexes normal.  Psychiatric:        Behavior: Behavior normal.        Thought Content: Thought content normal.        Judgment: Judgment normal.           Assessment & Plan:  Well exam. We discussed diet and exercise. Get fasting labs soon. For the headaches, she will try Fiorinal as needed. We will draw immunization titers today per her nursing school protocol.  Gershon Crane, MD

## 2019-10-18 ENCOUNTER — Other Ambulatory Visit: Payer: Self-pay

## 2019-10-18 ENCOUNTER — Other Ambulatory Visit (INDEPENDENT_AMBULATORY_CARE_PROVIDER_SITE_OTHER): Payer: 59

## 2019-10-18 DIAGNOSIS — Z0000 Encounter for general adult medical examination without abnormal findings: Secondary | ICD-10-CM

## 2019-10-18 DIAGNOSIS — Z209 Contact with and (suspected) exposure to unspecified communicable disease: Secondary | ICD-10-CM | POA: Diagnosis not present

## 2019-10-18 LAB — CBC WITH DIFFERENTIAL/PLATELET
Basophils Absolute: 0 10*3/uL (ref 0.0–0.1)
Basophils Relative: 0.3 % (ref 0.0–3.0)
Eosinophils Absolute: 0.1 10*3/uL (ref 0.0–0.7)
Eosinophils Relative: 1.5 % (ref 0.0–5.0)
HCT: 36.7 % (ref 36.0–46.0)
Hemoglobin: 12.4 g/dL (ref 12.0–15.0)
Lymphocytes Relative: 41.9 % (ref 12.0–46.0)
Lymphs Abs: 3.2 10*3/uL (ref 0.7–4.0)
MCHC: 33.6 g/dL (ref 30.0–36.0)
MCV: 82.3 fl (ref 78.0–100.0)
Monocytes Absolute: 0.2 10*3/uL (ref 0.1–1.0)
Monocytes Relative: 3.3 % (ref 3.0–12.0)
Neutro Abs: 4.1 10*3/uL (ref 1.4–7.7)
Neutrophils Relative %: 53 % (ref 43.0–77.0)
Platelets: 326 10*3/uL (ref 150.0–400.0)
RBC: 4.46 Mil/uL (ref 3.87–5.11)
RDW: 15.6 % — ABNORMAL HIGH (ref 11.5–15.5)
WBC: 7.6 10*3/uL (ref 4.0–10.5)

## 2019-10-18 LAB — LIPID PANEL
Cholesterol: 100 mg/dL (ref 0–200)
HDL: 42.3 mg/dL (ref >39.00)
LDL Cholesterol: 30 mg/dL (ref 0–99)
NonHDL: 57.91
Total CHOL/HDL Ratio: 2
Triglycerides: 140 mg/dL (ref 0.0–149.0)
VLDL: 28 mg/dL (ref 0.0–40.0)

## 2019-10-18 LAB — BASIC METABOLIC PANEL
BUN: 8 mg/dL (ref 6–23)
CO2: 27 mEq/L (ref 19–32)
Calcium: 9.1 mg/dL (ref 8.4–10.5)
Chloride: 105 mEq/L (ref 96–112)
Creatinine, Ser: 0.64 mg/dL (ref 0.40–1.20)
GFR: 114.05 mL/min (ref >60.00)
Glucose, Bld: 104 mg/dL — ABNORMAL HIGH (ref 70–99)
Potassium: 4.1 mEq/L (ref 3.5–5.1)
Sodium: 140 mEq/L (ref 135–145)

## 2019-10-18 LAB — HEPATIC FUNCTION PANEL
ALT: 24 U/L (ref 0–35)
AST: 20 U/L (ref 0–37)
Albumin: 4.4 g/dL (ref 3.5–5.2)
Alkaline Phosphatase: 85 U/L (ref 39–117)
Bilirubin, Direct: 0.1 mg/dL (ref 0.0–0.3)
Total Bilirubin: 0.2 mg/dL (ref 0.2–1.2)
Total Protein: 6.7 g/dL (ref 6.0–8.3)

## 2019-10-18 LAB — TSH: TSH: 1.16 u[IU]/mL (ref 0.35–4.50)

## 2019-10-18 LAB — HEMOGLOBIN A1C: Hgb A1c MFr Bld: 5.4 % (ref 4.6–6.5)

## 2019-10-19 LAB — VARICELLA ZOSTER ANTIBODY, IGG: Varicella IgG: 218.9 index

## 2019-10-19 LAB — MEASLES/MUMPS/RUBELLA IMMUNITY
Mumps IgG: 41.1 AU/mL
Rubella: 2.29 Index
Rubeola IgG: >300 AU/mL

## 2019-10-19 LAB — HEPATITIS B SURFACE ANTIBODY,QUALITATIVE: Hep B S Ab: NONREACTIVE

## 2019-10-19 LAB — HEPATITIS B SURFACE ANTIGEN: Hepatitis B Surface Ag: NONREACTIVE

## 2019-10-19 NOTE — Addendum Note (Signed)
Addended by: Philemon Kingdom on: 10/19/2019 04:40 PM   Modules accepted: Orders

## 2019-10-22 ENCOUNTER — Other Ambulatory Visit: Payer: Self-pay

## 2019-10-23 ENCOUNTER — Other Ambulatory Visit (INDEPENDENT_AMBULATORY_CARE_PROVIDER_SITE_OTHER): Payer: 59

## 2019-10-23 ENCOUNTER — Ambulatory Visit: Payer: 59

## 2019-10-23 DIAGNOSIS — Z23 Encounter for immunization: Secondary | ICD-10-CM

## 2019-10-23 DIAGNOSIS — Z209 Contact with and (suspected) exposure to unspecified communicable disease: Secondary | ICD-10-CM

## 2019-10-26 LAB — QUANTIFERON-TB GOLD PLUS
Mitogen-NIL: >10 IU/mL
NIL: 0.01 IU/mL
QuantiFERON-TB Gold Plus: NEGATIVE
TB1-NIL: 0.01 IU/mL
TB2-NIL: 0.01 IU/mL

## 2019-10-29 ENCOUNTER — Telehealth: Payer: Self-pay | Admitting: Family Medicine

## 2019-10-29 NOTE — Telephone Encounter (Signed)
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 She also needs to know the results from her lab work last week.   Pt can be reached at [phone]

## 2019-10-30 NOTE — Telephone Encounter (Signed)
Spoke with the patient. Results were given. Forms have been placed up for the patient to pick up.

## 2019-11-02 ENCOUNTER — Encounter: Payer: Self-pay | Admitting: Family Medicine

## 2019-11-02 ENCOUNTER — Telehealth (INDEPENDENT_AMBULATORY_CARE_PROVIDER_SITE_OTHER): Payer: 59 | Admitting: Family Medicine

## 2019-11-02 VITALS — Temp 98.1°F

## 2019-11-02 DIAGNOSIS — Z7189 Other specified counseling: Secondary | ICD-10-CM | POA: Diagnosis not present

## 2019-11-02 DIAGNOSIS — J069 Acute upper respiratory infection, unspecified: Secondary | ICD-10-CM | POA: Diagnosis not present

## 2019-11-02 NOTE — Progress Notes (Signed)
Virtual Visit via Video Note  I connected with Alexandra Meadows on 11/02/19 at 11:30 AM EDT by a video enabled telemedicine application 2/2 COVID-19 pandemic and verified that I am speaking with the correct person using two identifiers.  Location patient: home Location provider:work or home office Persons participating in the virtual visit: patient, provider  I discussed the limitations of evaluation and management by telemedicine and the availability of in person appointments. The patient expressed understanding and agreed to proceed.   HPI: Pt is a 24 yo female with pmh sig for migraines, asthma, seasonal allergies, h/o depression,  H/o anxiety, h/o seizure 2/2 medication who has a Birthday today and was seen for acute concern.    Pt with HAs, rhinorrhea, fatigue, cough, facial pain/pressure, congestion x 5 d.  Sarted feeling bad on Monday.  Denies fever, loss of taste or smell, n/v, or diarrhea.   Tried sudafed, goody's powder, nyquil.  Pt had COVID last year and her current symptoms feel similar to those symptoms.  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  Pt's daughter is sick and her son was sick.  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  She is in an accelerated program.  Pt had a send out COVID test done but scheduled a rapid test for this afternoon as she has a nursing lab tomorrow.  ROS: See pertinent positives and negatives per HPI.  Past Medical History:  Diagnosis Date  . Anemia   . Anxiety   . Asthma    prn inhaler; exercise induced  . Carpal tunnel syndrome   . Depression    severe at 20weeks  . 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 Infection    BV with preg; recurrent yeast inf  . Migraines   . Seasonal allergies   . Tonsillar hypertrophy 04/2016    Past Surgical History:  Procedure Laterality Date  . KNEE ARTHROSCOPY Left 02/28/2014   Procedure:  ARTHROSCOPY LEFT KNEE WITH SYNOVECTOMY LIMITED, PARTIAL LATERAL MENISECTOMY;  Surgeon: Loreta Ave, MD;  Location: [facility];  Service: Orthopedics;  Laterality: Left;  . TONSILLECTOMY AND ADENOIDECTOMY Bilateral 05/25/2016   Procedure: TONSILLECTOMY AND ADENOIDECTOMY;  Surgeon: Newman Pies, MD;  Location: [facility];  Service: ENT;  Laterality: Bilateral;  . WISDOM TOOTH EXTRACTION  2015    Family History  Problem Relation Age of Onset  . Diabetes Mother   . Cancer Maternal Grandfather      Current Outpatient Medications:  .  albuterol (PROVENTIL HFA;VENTOLIN HFA) 108 (90 Base) MCG/ACT inhaler, Inhale 2 puffs into the lungs every 4 (four) hours as needed for wheezing or shortness of breath., Disp: 1 Inhaler, Rfl: 11 .  butalbital-aspirin-caffeine (FIORINAL) 50-325-40 MG capsule, Take 1 capsule by mouth every 6 (six) hours as needed for headache., Disp: 60 capsule, Rfl: 1 .  Chlorpheniramine Maleate (ALLERGY PO), Take 1 tablet by mouth daily., Disp: , Rfl:  .  cyclobenzaprine (FLEXERIL) 10 MG tablet, Take 1 tablet (10 mg total) by mouth 3 (three) times daily as needed for muscle spasms., Disp: 60 tablet, Rfl: 5 .  diphenhydrAMINE HCl (BENADRYL PO), Take 1 tablet by mouth as needed., Disp: , Rfl:  .  drospirenone-ethinyl estradiol (YAZ) 3-0.02 MG tablet, Take 1 tablet by mouth daily., Disp: 1 Package, Rfl: 11  EXAM:  VITALS per patient if applicable:  RR  between 12-20 bpm  GENERAL: alert, oriented, appears fatigued, but in no acute distress  HEENT: atraumatic, conjunctiva clear, no obvious abnormalities on inspection of external nose and ears  NECK: normal movements of the head and neck  LUNGS: on inspection no signs of respiratory distress, breathing rate appears normal, no obvious gross SOB, gasping or wheezing  CV: no obvious cyanosis  MS: moves all visible extremities without noticeable abnormality  PSYCH/NEURO: pleasant and cooperative, no  obvious depression or anxiety, speech and thought processing grossly intact  ASSESSMENT AND PLAN:  Discussed the following assessment and plan:  Upper respiratory tract infection, unspecified type -discussed possible causes including allergies, acute nasal pharyngitis, influenza, cannot r/o COVID-19 infection -COVID test pending. -discussed self quarantine -continue supportive care.  Advised to use Tylenol.  Pt advised when taking NSAIDs not to do so on an empty stomach. -given precautions  Educated about COVID-19 virus infection -discussed s/s of COVID-19 infection -Agree with COVID testing.  Results pending. -self quarantine advised -given precautions  F/u prn   I discussed the assessment and treatment plan with the patient. The patient was provided an opportunity to ask questions and all were answered. The patient agreed with the plan and demonstrated an understanding of the instructions.   The patient was advised to call back or seek an in-person evaluation if the symptoms worsen or if the condition fails to improve as anticipated.   Billie Ruddy, MD

## 2020-01-07 IMAGING — US US MFM FETAL BPP W/O NON-STRESS
1 series · 13 of 28 positions shown · non-contrast
Comparison: none

[Series 1: us mfm fetal bpp w/o non-stress · 40 acquisitions, 13 frames shown]
[im 2/40]
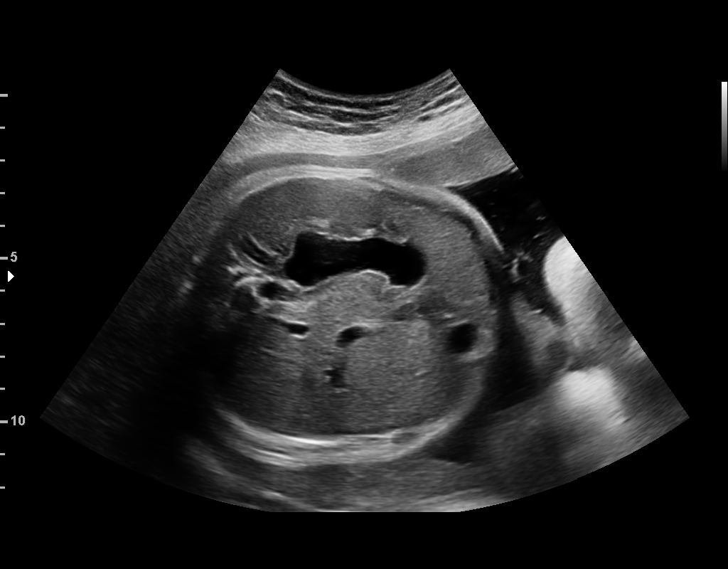
[im 5/40]
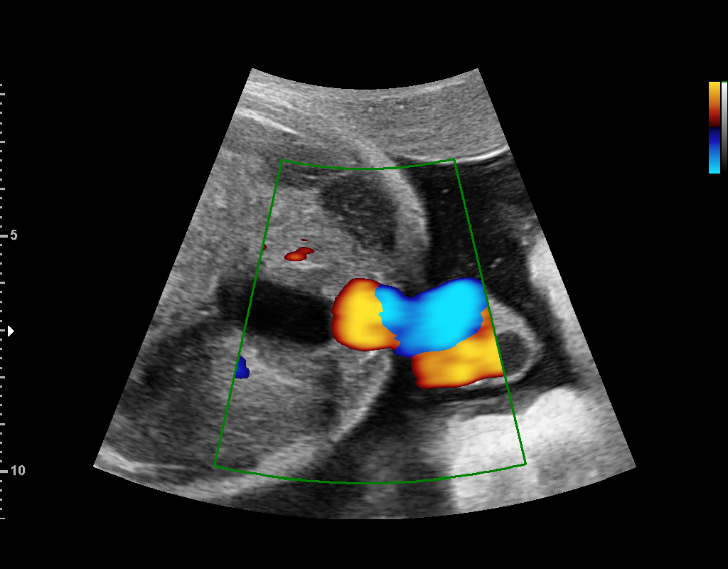
[im 8/40]
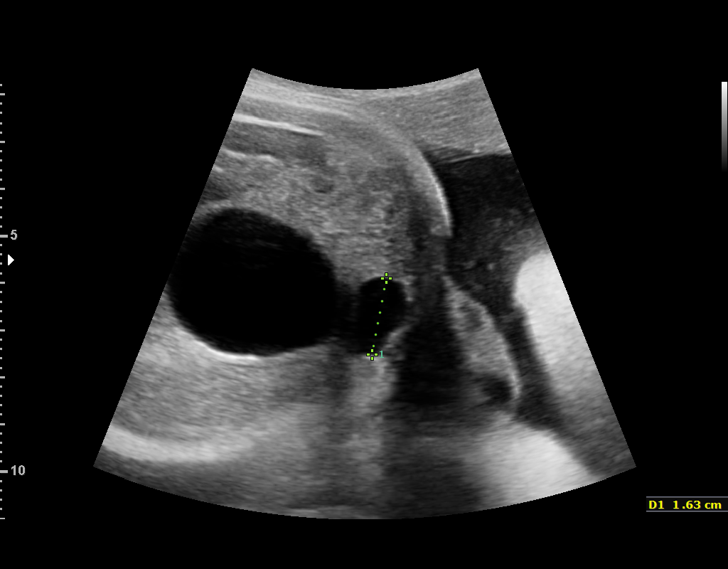
[im 11/40]
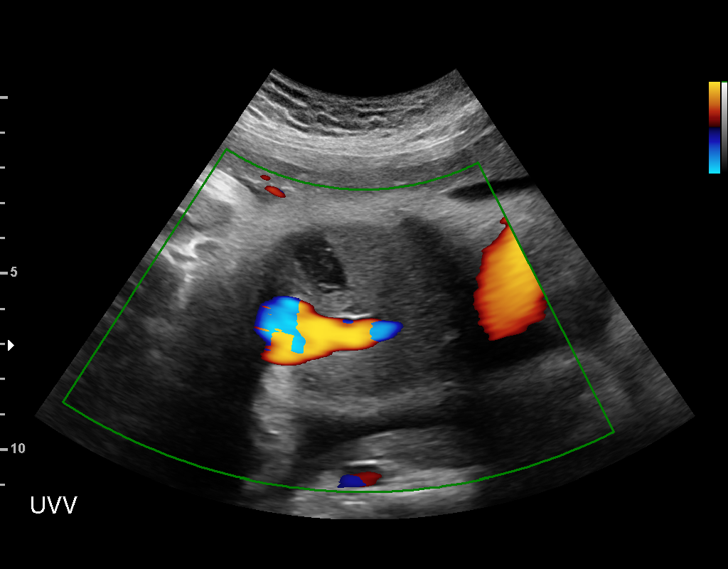
[im 14/40]
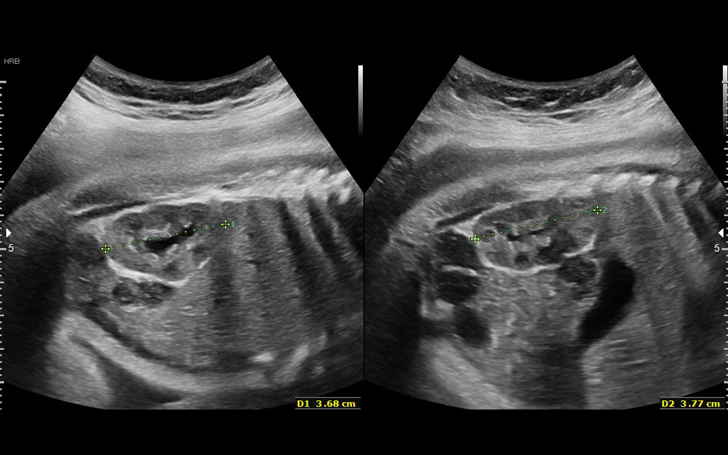
[im 16/40]
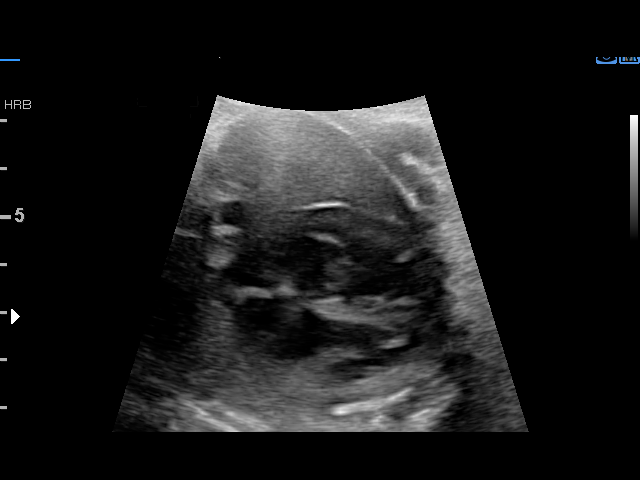
[im 21/40]
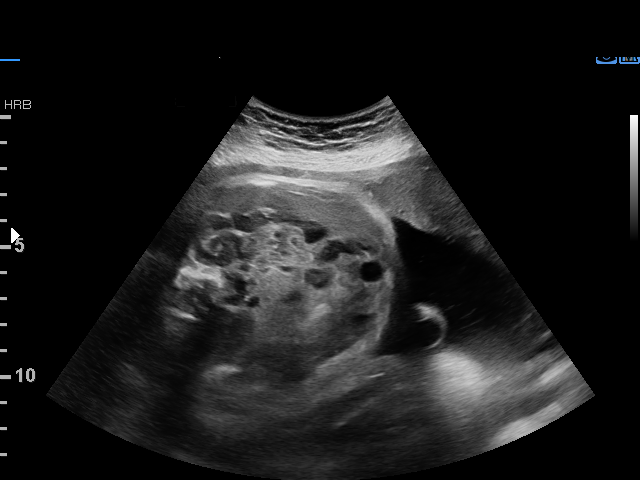
[im 24/40]
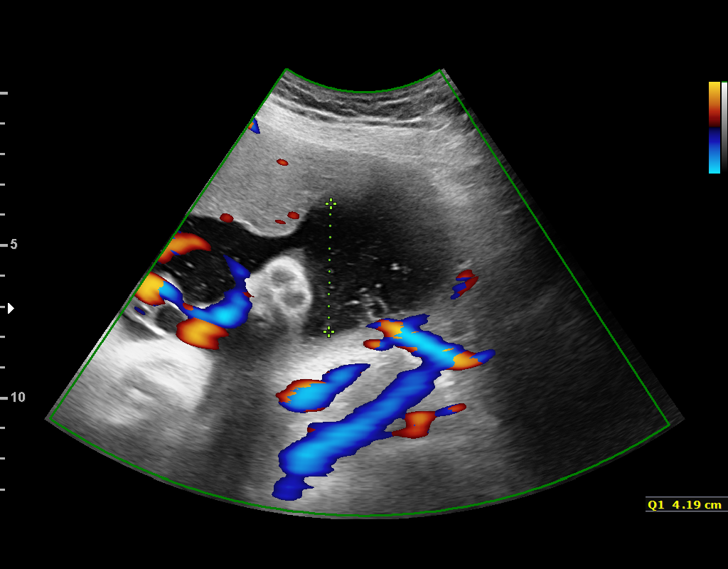
[im 27/40]
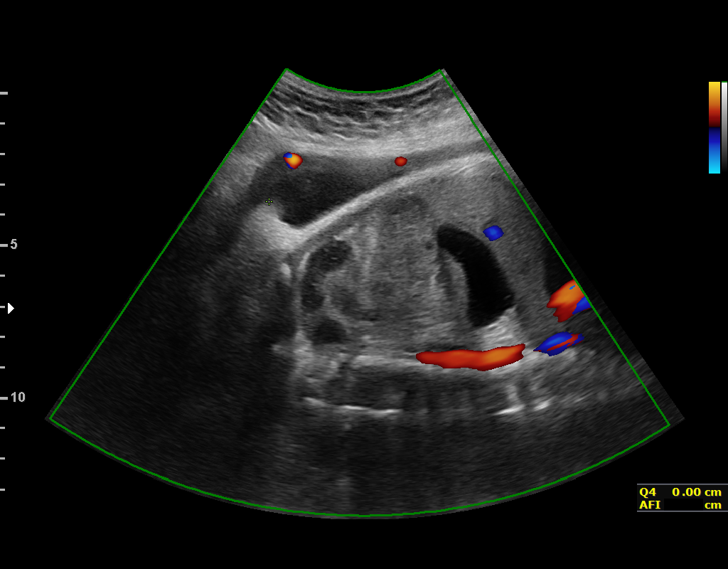
[im 29/40]
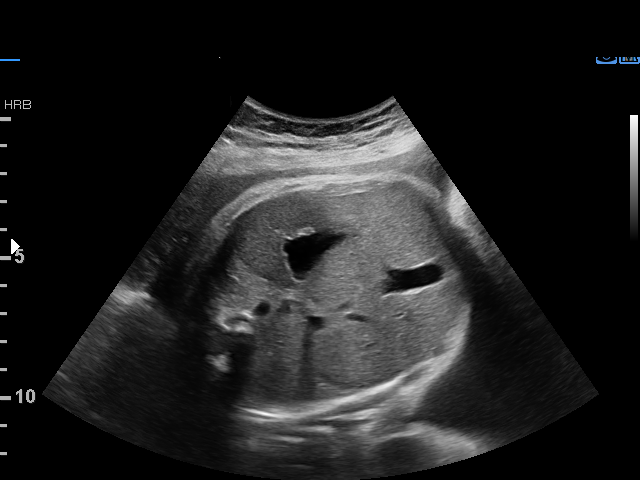
[im 32/40]
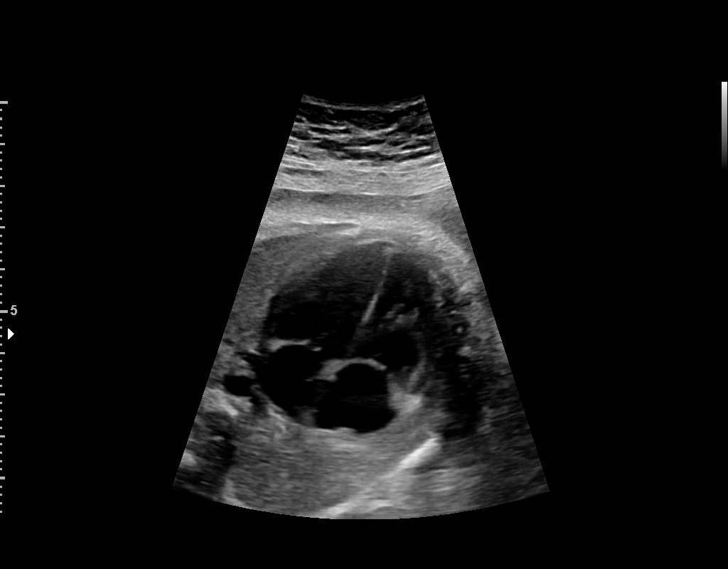
[im 35/40]
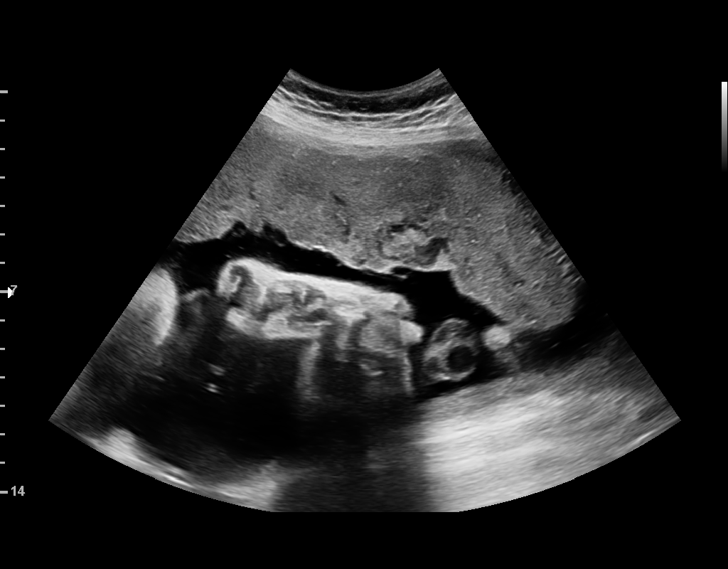
[im 38/40]
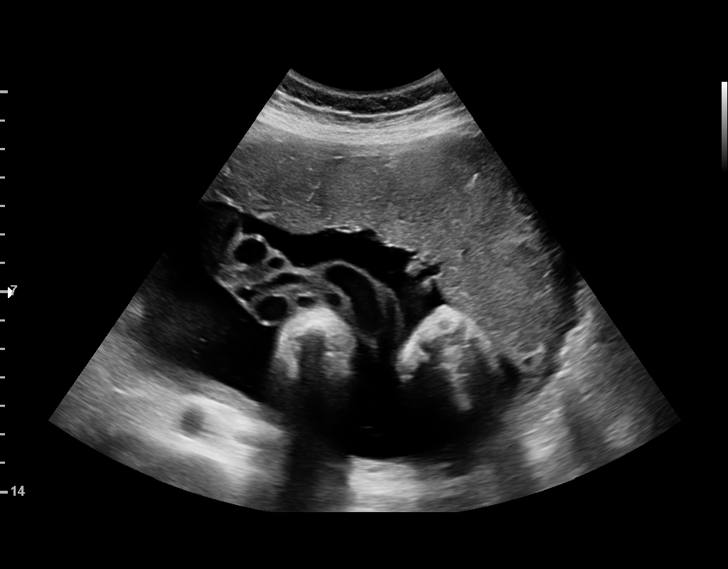

[13 of 28 positions shown; findings below may reference images not displayed]

Indications

36 weeks gestation of pregnancy
Small for gestational age fetus affecting
management of mother
Umbilical vein abnormality complicating
pregnancy
OB History

Blood Type:            Height:  5'3"   Weight (lb):  150       BMI:
Gravidity:    2         Term:   1        Prem:   0         SAB:   0
TOP:          0       Ectopic:  0        Living: 1
Fetal Evaluation

Num Of Fetuses:     1
Fetal Heart         138
Rate(bpm):
Cardiac Activity:   Observed
Presentation:       Cephalic
Placenta:           Anterior, above cervical os
P. Cord Insertion:  Previously Visualized

Amniotic Fluid
AFI FV:      Subjectively within normal limits

AFI Sum(cm)     %Tile       Largest Pocket(cm)
13.62           49

RUQ(cm)       RLQ(cm)       LUQ(cm)        LLQ(cm)
4.19          0
Biophysical Evaluation

Amniotic F.V:   Within normal limits       F. Tone:         Observed
F. Movement:    Observed                   Score:           [DATE]
F. Breathing:   Observed
Gestational Age

LMP:           36w 1d        Date:  04/11/17                 EDD:    01/16/18
Best:          36w 1d     Det. By:  LMP  (04/11/17)          EDD:    01/16/18
Anatomy

Thoracic:              Appears normal         Abdominal Wall:         UVV 1.63cm
Heart:                 Appears normal         Kidneys:                Appear normal
(4CH, axis, and situs
Stomach:               Appears normal, left   Bladder:                Appears normal
sided
Abdomen:               Appears normal
Cervix Uterus Adnexa

Cervix
Not visualized (advanced GA >59wks)
Impression

SIUP at 36+1 weeks
Cephalic presentation
UVV - no filling defects
Normal amniotic fluid volume
BPP [DATE]
Recommendations

Repeat testing [REDACTED]
Induction in 6 days

## 2020-01-10 ENCOUNTER — Ambulatory Visit: Payer: 59 | Attending: Internal Medicine

## 2020-01-10 DIAGNOSIS — Z23 Encounter for immunization: Secondary | ICD-10-CM

## 2020-01-10 NOTE — Progress Notes (Signed)
° °  Covid-19 Vaccination Clinic  Name:  Paislea Hatton    MRN: [id number] DOB: 04/29/1996  01/10/2020  Ms. Landin was observed post Covid-19 immunization for 30 minutes based on pre-vaccination screening without incident. She was provided with Vaccine Information Sheet and instruction to access the V-Safe system.   Ms. Delbridge was instructed to call 911 with any severe reactions post vaccine:  Difficulty breathing   Swelling of face and throat   A fast heartbeat   A bad rash all over body   Dizziness and weakness   Immunizations Administered    Name Date Dose VIS Date Route   JANSSEN COVID-19 VACCINE 01/10/2020  2:59 PM 0.5 mL 09/22/2019 Intramuscular   Manufacturer: [employer]   Lot: [id number]   NDC: 53646-803-21

## 2020-02-05 ENCOUNTER — Ambulatory Visit (INDEPENDENT_AMBULATORY_CARE_PROVIDER_SITE_OTHER): Payer: 59 | Admitting: Family Medicine

## 2020-02-05 ENCOUNTER — Encounter: Payer: Self-pay | Admitting: Family Medicine

## 2020-02-05 ENCOUNTER — Other Ambulatory Visit: Payer: Self-pay

## 2020-02-05 VITALS — BP 122/60 | HR 77 | Temp 98.9°F | Wt 152.0 lb

## 2020-02-05 DIAGNOSIS — F419 Anxiety disorder, unspecified: Secondary | ICD-10-CM

## 2020-02-05 DIAGNOSIS — F329 Major depressive disorder, single episode, unspecified: Secondary | ICD-10-CM | POA: Diagnosis not present

## 2020-02-05 DIAGNOSIS — Z23 Encounter for immunization: Secondary | ICD-10-CM | POA: Diagnosis not present

## 2020-02-05 DIAGNOSIS — F32A Depression, unspecified: Secondary | ICD-10-CM

## 2020-02-05 MED ORDER — SERTRALINE HCL 50 MG PO TABS: 50.0000 mg | ORAL_TABLET | Freq: Every day | ORAL | 3 refills | Status: DC

## 2020-02-05 MED ORDER — DROSPIRENONE-ETHINYL ESTRADIOL 3-0.02 MG PO TABS: 1.0000 | ORAL_TABLET | Freq: Every day | ORAL | 3 refills | Status: DC

## 2020-02-05 NOTE — Progress Notes (Signed)
   Subjective:    Patient ID: Alexandra Meadows, female    DOB: [birth year], 24 y.o.   MRN: [id number]  HPI Here to discuss her anxiety and depression, and she needs more hepatitis B vaccines. She had been taking Zoloft 100 mg until last year, when she stopped it. Now that she is back in school her anxiety has flared up again, and she has been taking some that she still had at home. She has been taking 1/2 tablet (50 mg) daily and this works well. Also her school is requiring a full hepatitis vaccine series and she has only only the first one last March.    Review of Systems  Constitutional: Negative.   Respiratory: Negative.   Cardiovascular: Negative.   Psychiatric/Behavioral: Negative for agitation, behavioral problems, confusion, decreased concentration, dysphoric mood, self-injury, sleep disturbance and suicidal ideas. The patient is nervous/anxious.        Objective:   Physical Exam Constitutional:      Appearance: Normal appearance.  Cardiovascular:     Rate and Rhythm: Normal rate and regular rhythm.     Pulses: Normal pulses.     Heart sounds: Normal heart sounds.  Pulmonary:     Effort: Pulmonary effort is normal.     Breath sounds: Normal breath sounds.  Neurological:     Mental Status: She is alert.  Psychiatric:        Mood and Affect: Mood normal.        Behavior: Behavior normal.        Thought Content: Thought content normal.        Judgment: Judgment normal.           Assessment & Plan:  For anxiety and depression , we will write her for Zoloft 50 mg tablets once daily. She is also given the second hepatitis B vaccine.  Gershon Crane, MD

## 2020-03-10 ENCOUNTER — Other Ambulatory Visit: Payer: Self-pay

## 2020-03-10 ENCOUNTER — Ambulatory Visit (INDEPENDENT_AMBULATORY_CARE_PROVIDER_SITE_OTHER): Payer: 59 | Admitting: Family Medicine

## 2020-03-10 ENCOUNTER — Encounter: Payer: Self-pay | Admitting: Family Medicine

## 2020-03-10 VITALS — BP 120/60 | HR 94 | Temp 98.2°F | Wt 152.8 lb

## 2020-03-10 DIAGNOSIS — R42 Dizziness and giddiness: Secondary | ICD-10-CM | POA: Diagnosis not present

## 2020-03-10 DIAGNOSIS — J3089 Other allergic rhinitis: Secondary | ICD-10-CM | POA: Diagnosis not present

## 2020-03-10 MED ORDER — METHYLPREDNISOLONE ACETATE 40 MG/ML IJ SUSP
40.0000 mg | Freq: Once | INTRAMUSCULAR | Status: AC
  Administered 2020-03-10: 40 mg via INTRAMUSCULAR

## 2020-03-10 MED ORDER — METHYLPREDNISOLONE ACETATE 80 MG/ML IJ SUSP
80.0000 mg | Freq: Once | INTRAMUSCULAR | Status: AC
  Administered 2020-03-10: 80 mg via INTRAMUSCULAR

## 2020-03-10 NOTE — Progress Notes (Signed)
   Subjective:    Patient ID: Alexandra Meadows, female    DOB: May 08, 1996, 24 y.o.   MRN: [id number]  HPI Here for one month of intermittent mild dizziness, pressure in both ears, stuffy nose, and headaches. No fever or ST or cough. She has used allergy medications off and on but never consistently.    Review of Systems  Constitutional: Negative.   HENT: Positive for congestion, ear pain, postnasal drip and sinus pressure. Negative for sinus pain and sore throat.   Eyes: Negative.   Respiratory: Negative.   Neurological: Positive for dizziness.       Objective:   Physical Exam Constitutional:      Appearance: Normal appearance. She is not ill-appearing.  HENT:     Right Ear: Tympanic membrane, ear canal and external ear normal.     Left Ear: Tympanic membrane, ear canal and external ear normal.     Nose: Nose normal.     Mouth/Throat:     Pharynx: Oropharynx is clear.  Eyes:     Conjunctiva/sclera: Conjunctivae normal.  Cardiovascular:     Rate and Rhythm: Normal rate and regular rhythm.     Pulses: Normal pulses.     Heart sounds: Normal heart sounds.  Pulmonary:     Effort: Pulmonary effort is normal.     Breath sounds: Normal breath sounds.  Lymphadenopathy:     Cervical: No cervical adenopathy.  Neurological:     General: No focal deficit present.     Mental Status: She is alert and oriented to person, place, and time.     Cranial Nerves: No cranial nerve deficit.           Assessment & Plan:  She has some respiratory allergies and these are causing some vertigo symptoms. She is given a DepoMedrol shot and she will being using Xyzal and Flonase on a daily basis.  Gershon Crane, MD

## 2020-04-22 ENCOUNTER — Telehealth: Payer: Self-pay | Admitting: Family Medicine

## 2020-04-22 NOTE — Telephone Encounter (Signed)
Patient called to discuss her call about an injection in her back. She says she called her orthopedic Eulah Pont and Thurston Hole. She says they will take care of this for her.

## 2020-04-22 NOTE — Telephone Encounter (Signed)
pt calling want to know if she can have an injection for her back  [phone]

## 2020-05-21 ENCOUNTER — Other Ambulatory Visit: Payer: Self-pay

## 2020-05-21 ENCOUNTER — Encounter: Payer: Self-pay | Admitting: Family Medicine

## 2020-05-21 ENCOUNTER — Ambulatory Visit (INDEPENDENT_AMBULATORY_CARE_PROVIDER_SITE_OTHER): Payer: 59 | Admitting: Family Medicine

## 2020-05-21 VITALS — BP 118/76 | HR 84 | Temp 98.2°F | Ht 64.5 in | Wt 160.6 lb

## 2020-05-21 DIAGNOSIS — F419 Anxiety disorder, unspecified: Secondary | ICD-10-CM | POA: Diagnosis not present

## 2020-05-21 DIAGNOSIS — G43009 Migraine without aura, not intractable, without status migrainosus: Secondary | ICD-10-CM | POA: Diagnosis not present

## 2020-05-21 DIAGNOSIS — F32A Depression, unspecified: Secondary | ICD-10-CM

## 2020-05-21 MED ORDER — SUMATRIPTAN SUCCINATE 100 MG PO TABS: 100.0000 mg | ORAL_TABLET | ORAL | 5 refills | Status: DC | PRN

## 2020-05-21 MED ORDER — ESCITALOPRAM OXALATE 10 MG PO TABS: 10.0000 mg | ORAL_TABLET | Freq: Every day | ORAL | 5 refills | Status: DC

## 2020-05-21 NOTE — Progress Notes (Signed)
   Subjective:    Patient ID: Alexandra Meadows, female    DOB: Sep 19, 1995, 24 y.o.   MRN: [id number]  HPI Here for several issues. First she wants to stop Zoloft and try something else for her anxiety and depression. The Zoloft works well but it makes her sweat profusely. This can be embarrassing when she is in public. Also her migraines have become more frequent, averaging one a week. She asks for something to take as needed.    Review of Systems  Constitutional: Negative.   Respiratory: Negative.   Cardiovascular: Negative.   Neurological: Positive for headaches.  Psychiatric/Behavioral: Positive for dysphoric mood. The patient is nervous/anxious.        Objective:   Physical Exam Constitutional:      Appearance: Normal appearance.  Cardiovascular:     Rate and Rhythm: Normal rate and regular rhythm.     Pulses: Normal pulses.     Heart sounds: Normal heart sounds.  Pulmonary:     Effort: Pulmonary effort is normal.     Breath sounds: Normal breath sounds.  Neurological:     General: No focal deficit present.     Mental Status: She is alert and oriented to person, place, and time.  Psychiatric:        Mood and Affect: Mood normal.        Behavior: Behavior normal.        Thought Content: Thought content normal.        Judgment: Judgment normal.           Assessment & Plan:  For anxiety and depression, we will stop Zoloft and try Lexapro 10 mg daily. For migraines she will try Imitrex 100 mg as needed. She will report back in 3-4 weeks. Gershon Crane, MD

## 2020-08-06 ENCOUNTER — Other Ambulatory Visit: Payer: Self-pay

## 2020-08-07 ENCOUNTER — Encounter: Payer: Self-pay | Admitting: Family Medicine

## 2020-08-07 ENCOUNTER — Telehealth (INDEPENDENT_AMBULATORY_CARE_PROVIDER_SITE_OTHER): Payer: 59 | Admitting: Family Medicine

## 2020-08-07 DIAGNOSIS — R42 Dizziness and giddiness: Secondary | ICD-10-CM | POA: Diagnosis not present

## 2020-08-07 MED ORDER — METHYLPREDNISOLONE 4 MG PO TBPK: ORAL_TABLET | ORAL | 0 refills | Status: DC

## 2020-08-07 MED ORDER — MECLIZINE HCL 25 MG PO TABS: 25.0000 mg | ORAL_TABLET | ORAL | 0 refills | Status: DC | PRN

## 2020-08-07 NOTE — Progress Notes (Signed)
   Subjective:    Patient ID: Alexandra Meadows, female    DOB: January 14, 1996, 25 y.o.   MRN: [id number]  HPI Virtual Visit via Telephone Note  I connected with the patient on 08/07/20 at  9:30 AM EST by telephone and verified that I am speaking with the correct person using two identifiers.   I discussed the limitations, risks, security and privacy concerns of performing an evaluation and management service by telephone and the availability of in person appointments. I also discussed with the patient that there may be a patient responsible charge related to this service. The patient expressed understanding and agreed to proceed.  Location patient: home Location provider: work or home office Participants present for the call: patient, provider Patient did not have a visit in the prior 7 days to address this/these issue(s).   History of Present Illness: Here flor 3 days of dizziness which is worse when she moves quickly. She has nausea but has not vomited. She has a hx of vertigo. No headache or fever. Drinking fluids.    Observations/Objective: Patient sounds cheerful and well on the phone. I do not appreciate any SOB. Speech and thought processing are grossly intact. Patient reported vitals:  Assessment and Plan: Vertigo, treat with Meclizine and a Medrol dose pack. Gershon Crane, MD   Follow Up Instructions:     [phone] 5-10 [phone] 11-20 [phone] 21-30 I did not refer this patient for an OV in the next 24 hours for this/these issue(s).  I discussed the assessment and treatment plan with the patient. The patient was provided an opportunity to ask questions and all were answered. The patient agreed with the plan and demonstrated an understanding of the instructions.   The patient was advised to call back or seek an in-person evaluation if the symptoms worsen or if the condition fails to improve as anticipated.  I provided 12 minutes of non-face-to-face time during this  encounter.   Gershon Crane, MD    Review of Systems     Objective:   Physical Exam        Assessment & Plan:

## 2020-10-03 ENCOUNTER — Encounter: Payer: Self-pay | Admitting: Family Medicine

## 2020-10-03 ENCOUNTER — Telehealth (INDEPENDENT_AMBULATORY_CARE_PROVIDER_SITE_OTHER): Payer: 59 | Admitting: Family Medicine

## 2020-10-03 DIAGNOSIS — J019 Acute sinusitis, unspecified: Secondary | ICD-10-CM

## 2020-10-03 MED ORDER — AMOXICILLIN-POT CLAVULANATE 875-125 MG PO TABS: 1.0000 | ORAL_TABLET | Freq: Two times a day (BID) | ORAL | 0 refills | Status: DC

## 2020-10-03 NOTE — Progress Notes (Signed)
Subjective:    Patient ID: Alexandra Meadows, female    DOB: 1996-03-03, 25 y.o.   MRN: [id number]  HPI Virtual Visit via Video Note  I connected with the patient on 10/03/20 at  2:30 PM EST by a video enabled telemedicine application and verified that I am speaking with the correct person using two identifiers.  Location patient: home Location provider:work or home office Persons participating in the virtual visit: patient, provider  I discussed the limitations of evaluation and management by telemedicine and the availability of in person appointments. The patient expressed understanding and agreed to proceed.   HPI: Here for 2 weeks of sinus pressure, blowing green mucus from the nose, and PND. No fever or body aches. No SOB or cough. No NVD. Taking Sudafed.    ROS: See pertinent positives and negatives per HPI.  Past Medical History:  Diagnosis Date  . Anemia   . Anxiety   . Asthma    prn inhaler; exercise induced  . Carpal tunnel syndrome   . Depression    severe at 20weeks  . 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 Infection    BV with preg; recurrent yeast inf  . Migraines   . Seasonal allergies   . Tonsillar hypertrophy 04/2016    Past Surgical History:  Procedure Laterality Date  . KNEE ARTHROSCOPY Left 02/28/2014   Procedure: ARTHROSCOPY LEFT KNEE WITH SYNOVECTOMY LIMITED, PARTIAL LATERAL MENISECTOMY;  Surgeon: Loreta Ave, MD;  Location: [facility];  Service: Orthopedics;  Laterality: Left;  . TONSILLECTOMY AND ADENOIDECTOMY Bilateral 05/25/2016   Procedure: TONSILLECTOMY AND ADENOIDECTOMY;  Surgeon: Newman Pies, MD;  Location: [facility];  Service: ENT;  Laterality: Bilateral;  . WISDOM TOOTH EXTRACTION  2015    Family History  Problem Relation Age of Onset  . Diabetes Mother   . Cancer Maternal Grandfather      Current Outpatient Medications:  .  albuterol (PROVENTIL HFA;VENTOLIN HFA)  108 (90 Base) MCG/ACT inhaler, Inhale 2 puffs into the lungs every 4 (four) hours as needed for wheezing or shortness of breath., Disp: 1 Inhaler, Rfl: 11 .  amoxicillin-clavulanate (AUGMENTIN) 875-125 MG tablet, Take 1 tablet by mouth 2 (two) times daily., Disp: 20 tablet, Rfl: 0 .  butalbital-aspirin-caffeine (FIORINAL) 50-325-40 MG capsule, Take 1 capsule by mouth every 6 (six) hours as needed for headache., Disp: 60 capsule, Rfl: 1 .  cyclobenzaprine (FLEXERIL) 10 MG tablet, Take 1 tablet (10 mg total) by mouth 3 (three) times daily as needed for muscle spasms., Disp: 60 tablet, Rfl: 5 .  diphenhydrAMINE HCl (BENADRYL PO), Take 1 tablet by mouth as needed., Disp: , Rfl:  .  drospirenone-ethinyl estradiol (YAZ) 3-0.02 MG tablet, Take 1 tablet by mouth daily., Disp: 90 tablet, Rfl: 3 .  escitalopram (LEXAPRO) 10 MG tablet, Take 1 tablet (10 mg total) by mouth daily., Disp: 30 tablet, Rfl: 5 .  fluticasone (FLONASE) 50 MCG/ACT nasal spray, Place 2 sprays into both nostrils daily., Disp: , Rfl:  .  Levocetirizine Dihydrochloride (XYZAL ALLERGY 24HR PO), Take 1 tablet by mouth daily., Disp: , Rfl:  .  meclizine (ANTIVERT) 25 MG tablet, Take 1 tablet (25 mg total) by mouth every 4 (four) hours as needed for dizziness., Disp: 60 tablet, Rfl: 0 .  SUMAtriptan (IMITREX) 100 MG tablet, Take 1 tablet (100 mg total) by mouth as needed for migraine. May repeat in 2 hours if headache persists  or recurs., Disp: 10 tablet, Rfl: 5  EXAM:  VITALS per patient if applicable:  GENERAL: alert, oriented, appears well and in no acute distress  HEENT: atraumatic, conjunttiva clear, no obvious abnormalities on inspection of external nose and ears  NECK: normal movements of the head and neck  LUNGS: on inspection no signs of respiratory distress, breathing rate appears normal, no obvious gross SOB, gasping or wheezing  CV: no obvious cyanosis  MS: moves all visible extremities without noticeable  abnormality  PSYCH/NEURO: pleasant and cooperative, no obvious depression or anxiety, speech and thought processing grossly intact  ASSESSMENT AND PLAN: Sinusitis, treat with Augmentin.  Gershon Crane, MD  Discussed the following assessment and plan:  No diagnosis found.     I discussed the assessment and treatment plan with the patient. The patient was provided an opportunity to ask questions and all were answered. The patient agreed with the plan and demonstrated an understanding of the instructions.   The patient was advised to call back or seek an in-person evaluation if the symptoms worsen or if the condition fails to improve as anticipated.     Review of Systems     Objective:   Physical Exam        Assessment & Plan:

## 2020-10-20 ENCOUNTER — Emergency Department (HOSPITAL_COMMUNITY)
Admission: EM | Admit: 2020-10-20 | Discharge: 2020-10-20 | Disposition: A | Discharge status: Home / Self Care | Payer: 59 | Attending: Emergency Medicine | Admitting: Emergency Medicine

## 2020-10-20 ENCOUNTER — Ambulatory Visit: Payer: Self-pay

## 2020-10-20 ENCOUNTER — Encounter (HOSPITAL_COMMUNITY): Payer: Self-pay | Admitting: Emergency Medicine

## 2020-10-20 ENCOUNTER — Other Ambulatory Visit: Payer: Self-pay

## 2020-10-20 DIAGNOSIS — R1084 Generalized abdominal pain: Secondary | ICD-10-CM | POA: Diagnosis present

## 2020-10-20 DIAGNOSIS — R1110 Vomiting, unspecified: Secondary | ICD-10-CM | POA: Diagnosis not present

## 2020-10-20 DIAGNOSIS — Z5321 Procedure and treatment not carried out due to patient leaving prior to being seen by health care provider: Secondary | ICD-10-CM | POA: Diagnosis not present

## 2020-10-20 DIAGNOSIS — R197 Diarrhea, unspecified: Secondary | ICD-10-CM | POA: Diagnosis not present

## 2020-10-20 LAB — URINALYSIS, ROUTINE W REFLEX MICROSCOPIC
Bilirubin Urine: NEGATIVE
Glucose, UA: NEGATIVE mg/dL
Ketones, ur: 20 mg/dL — AB
Nitrite: NEGATIVE
Protein, ur: 30 mg/dL — AB
Specific Gravity, Urine: 1.023 (ref 1.005–1.030)
pH: 5 (ref 5.0–8.0)

## 2020-10-20 LAB — CBC
HCT: 37.2 % (ref 36.0–46.0)
Hemoglobin: 12.4 g/dL (ref 12.0–15.0)
MCH: 27.7 pg (ref 26.0–34.0)
MCHC: 33.3 g/dL (ref 30.0–36.0)
MCV: 83 fL (ref 80.0–100.0)
Platelets: 289 10*3/uL (ref 150–400)
RBC: 4.48 MIL/uL (ref 3.87–5.11)
RDW: 13.5 % (ref 11.5–15.5)
WBC: 5.6 10*3/uL (ref 4.0–10.5)
nRBC: 0 % (ref 0.0–0.2)

## 2020-10-20 LAB — I-STAT BETA HCG BLOOD, ED (MC, WL, AP ONLY): I-stat hCG, quantitative: <5 m[IU]/mL (ref <5)

## 2020-10-20 LAB — COMPREHENSIVE METABOLIC PANEL
ALT: 55 U/L — ABNORMAL HIGH (ref 0–44)
AST: 44 U/L — ABNORMAL HIGH (ref 15–41)
Albumin: 3.8 g/dL (ref 3.5–5.0)
Alkaline Phosphatase: 60 U/L (ref 38–126)
Anion gap: 9 (ref 5–15)
BUN: 5 mg/dL — ABNORMAL LOW (ref 6–20)
CO2: 25 mmol/L (ref 22–32)
Calcium: 8.6 mg/dL — ABNORMAL LOW (ref 8.9–10.3)
Chloride: 104 mmol/L (ref 98–111)
Creatinine, Ser: 0.67 mg/dL (ref 0.44–1.00)
GFR, Estimated: >60 mL/min (ref >60)
Glucose, Bld: 91 mg/dL (ref 70–99)
Potassium: 3.1 mmol/L — ABNORMAL LOW (ref 3.5–5.1)
Sodium: 138 mmol/L (ref 135–145)
Total Bilirubin: 0.5 mg/dL (ref 0.3–1.2)
Total Protein: 7 g/dL (ref 6.5–8.1)

## 2020-10-20 LAB — LIPASE, BLOOD: Lipase: 28 U/L (ref 11–51)

## 2020-10-20 NOTE — ED Triage Notes (Signed)
Patient reports generalized abdominal pain with emesis and diarrhea today , denies fever or chills .  

## 2020-10-20 NOTE — Telephone Encounter (Signed)
Went to [place] Saturday x 3 days- watery diarrhea, vomiting only 1 popsicle and sips of water, dizzy and weak. BS 101. Tongue dry, Last void was 2 hour- 7 runny severe. Abdominal pain LLQ and "entire" mild - crampy. Flutters in stomach. Urine was dark. Sweating- advised to go to ED. Pt going to [employer].   Reason for Disposition . [1] Drinking very little AND [2] dehydration suspected (e.g., no urine > 12 hours, very dry mouth, very lightheaded)  Answer Assessment - Initial Assessment Questions 1. DIARRHEA SEVERITY: "How bad is the diarrhea?" "How many extra stools have you had in the past 24 hours than normal?"    - NO DIARRHEA (SCALE 0)   - MILD (SCALE 1-3): Few loose or mushy BMs; increase of 1-3 stools over normal daily number of stools; mild increase in ostomy output.   -  MODERATE (SCALE 4-7): Increase of 4-6 stools daily over normal; moderate increase in ostomy output. * SEVERE (SCALE 8-10; OR 'WORST POSSIBLE'): Increase of 7 or more stools daily over normal; moderate increase in ostomy output; incontinence.     severe 2. ONSET: "When did the diarrhea begin?"      3 days ago 3. BM CONSISTENCY: "How loose or watery is the diarrhea?"      watery 4. VOMITING: "Are you also vomiting?" If Yes, ask: "How many times in the past 24 hours?"      Yes- 5. ABDOMINAL PAIN: "Are you having any abdominal pain?" If Yes, ask: "What does it feel like?" (e.g., crampy, dull, intermittent, constant)      Yes- crampy and "fluttery" 6. ABDOMINAL PAIN SEVERITY: If present, ask: "How bad is the pain?"  (e.g., Scale 1-10; mild, moderate, or severe)   - MILD (1-3): doesn't interfere with normal activities, abdomen soft and not tender to touch    - MODERATE (4-7): interferes with normal activities or awakens from sleep, tender to touch    - SEVERE (8-10): excruciating pain, doubled over, unable to do any normal activities       mild 7. ORAL INTAKE: If vomiting, "Have you been able to drink liquids?" "How much  fluids have you had in the past 24 hours?"     1 popsicle 8. HYDRATION: "Any signs of dehydration?" (e.g., dry mouth [not just dry lips], too weak to stand, dizziness, new weight loss) "When did you last urinate?"     Yes- voided 2 hours ago with dark urine 9. EXPOSURE: "Have you traveled to a foreign country recently?" "Have you been exposed to anyone with diarrhea?" "Could you have eaten any food that was spoiled?"     Went to [place]- water tasted "plasticy" 10. ANTIBIOTIC USE: "Are you taking antibiotics now or have you taken antibiotics in the past 2 months?"       no 11. OTHER SYMPTOMS: "Do you have any other symptoms?" (e.g., fever, blood in stool)       Subjective fever, diaphoretic  Protocols used: DIARRHEA-A-AH

## 2020-10-20 NOTE — ED Notes (Signed)
Pt stated that she no longer wanted to wait and left.

## 2020-10-20 NOTE — ED Notes (Signed)
Patient in restroom when called for triage. 

## 2020-10-21 ENCOUNTER — Telehealth (INDEPENDENT_AMBULATORY_CARE_PROVIDER_SITE_OTHER): Payer: 59 | Admitting: Family Medicine

## 2020-10-21 DIAGNOSIS — A084 Viral intestinal infection, unspecified: Secondary | ICD-10-CM | POA: Diagnosis not present

## 2020-10-21 DIAGNOSIS — E876 Hypokalemia: Secondary | ICD-10-CM

## 2020-10-21 DIAGNOSIS — R7401 Elevation of levels of liver transaminase levels: Secondary | ICD-10-CM

## 2020-10-21 MED ORDER — ONDANSETRON 8 MG PO TBDP: 8.0000 mg | ORAL_TABLET | Freq: Three times a day (TID) | ORAL | 0 refills | Status: DC | PRN

## 2020-10-21 MED ORDER — FLUCONAZOLE 150 MG PO TABS: 150.0000 mg | ORAL_TABLET | Freq: Once | ORAL | 0 refills | Status: AC

## 2020-10-21 NOTE — Progress Notes (Signed)
Patient ID: Alexandra Meadows, female   DOB: [birth year], 25 y.o.   MRN: [id number]   This visit type was conducted due to national recommendations for restrictions regarding the COVID-19 pandemic in an effort to limit this patient's exposure and mitigate transmission in our community.   Virtual Visit via Video Note  I connected with Alexandra Meadows on 10/21/20 at  3:15 PM EDT by a video enabled telemedicine application and verified that I am speaking with the correct person using two identifiers.  Location patient: home Location provider:work or home office Persons participating in the virtual visit: patient, provider  I discussed the limitations of evaluation and management by telemedicine and the availability of in person appointments. The patient expressed understanding and agreed to proceed.   HPI: [name] was out and I was visiting friends in [place] last week.  She states that several of her friends developed GI symptoms with vomiting and diarrhea.  Her 22-year-old daughter was with her and she developed vomiting and diarrhea on Saturday.  [name] started having some nausea Saturday.  She drove all the way back from [place] 17 hours straight on Sunday.  Yesterday she had severe diarrhea with about 16 episodes total.  Took some Imodium last night.  She had no diarrhea today.  No bloody diarrhea.  Still feels queasy but vomiting is improved.  She went to the ER yesterday hoping to get some IV fluids.  After waiting for about 7 hours she ended up leaving.  She did have labs.  Her urinalysis revealed nonspecific findings of trace leukocytes.  Chemistries were significant for potassium 3.1 and mild elevated AST 44 with ALT 55.  Lipase normal.  CBC was normal.  She does have some vaginal itching and vaginal discharge similar to previous yeast infections.  She has not been sexually active in some time.  She states there is no risk of pregnancy.  She feels very fatigued and wiped out at this time but  is keeping down some fluids today.   ROS: See pertinent positives and negatives per HPI.  Past Medical History:  Diagnosis Date  . Anemia   . Anxiety   . Asthma    prn inhaler; exercise induced  . Carpal tunnel syndrome   . Depression    severe at 20weeks  . 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 Infection    BV with preg; recurrent yeast inf  . Migraines   . Seasonal allergies   . Tonsillar hypertrophy 04/2016    Past Surgical History:  Procedure Laterality Date  . KNEE ARTHROSCOPY Left 02/28/2014   Procedure: ARTHROSCOPY LEFT KNEE WITH SYNOVECTOMY LIMITED, PARTIAL LATERAL MENISECTOMY;  Surgeon: Loreta Ave, MD;  Location: [facility];  Service: Orthopedics;  Laterality: Left;  . TONSILLECTOMY AND ADENOIDECTOMY Bilateral 05/25/2016   Procedure: TONSILLECTOMY AND ADENOIDECTOMY;  Surgeon: Newman Pies, MD;  Location: [facility];  Service: ENT;  Laterality: Bilateral;  . WISDOM TOOTH EXTRACTION  2015    Family History  Problem Relation Age of Onset  . Diabetes Mother   . Cancer Maternal Grandfather     SOCIAL HX: Non-smoker   Current Outpatient Medications:  .  fluconazole (DIFLUCAN) 150 MG tablet, Take 1 tablet (150 mg total) by mouth once for 1 dose., Disp: 1 tablet, Rfl: 0 .  ondansetron (ZOFRAN ODT) 8 MG disintegrating tablet, Take 1 tablet (8 mg total) by mouth every 8 (eight) hours as needed for  nausea or vomiting., Disp: 12 tablet, Rfl: 0 .  albuterol (PROVENTIL HFA;VENTOLIN HFA) 108 (90 Base) MCG/ACT inhaler, Inhale 2 puffs into the lungs every 4 (four) hours as needed for wheezing or shortness of breath., Disp: 1 Inhaler, Rfl: 11 .  amoxicillin-clavulanate (AUGMENTIN) 875-125 MG tablet, Take 1 tablet by mouth 2 (two) times daily., Disp: 20 tablet, Rfl: 0 .  butalbital-aspirin-caffeine (FIORINAL) 50-325-40 MG capsule, Take 1 capsule by mouth every 6 (six) hours as needed for headache., Disp: 60 capsule,  Rfl: 1 .  cyclobenzaprine (FLEXERIL) 10 MG tablet, Take 1 tablet (10 mg total) by mouth 3 (three) times daily as needed for muscle spasms., Disp: 60 tablet, Rfl: 5 .  diphenhydrAMINE HCl (BENADRYL PO), Take 1 tablet by mouth as needed., Disp: , Rfl:  .  drospirenone-ethinyl estradiol (YAZ) 3-0.02 MG tablet, Take 1 tablet by mouth daily., Disp: 90 tablet, Rfl: 3 .  escitalopram (LEXAPRO) 10 MG tablet, Take 1 tablet (10 mg total) by mouth daily., Disp: 30 tablet, Rfl: 5 .  fluticasone (FLONASE) 50 MCG/ACT nasal spray, Place 2 sprays into both nostrils daily., Disp: , Rfl:  .  Levocetirizine Dihydrochloride (XYZAL ALLERGY 24HR PO), Take 1 tablet by mouth daily., Disp: , Rfl:  .  meclizine (ANTIVERT) 25 MG tablet, Take 1 tablet (25 mg total) by mouth every 4 (four) hours as needed for dizziness., Disp: 60 tablet, Rfl: 0 .  SUMAtriptan (IMITREX) 100 MG tablet, Take 1 tablet (100 mg total) by mouth as needed for migraine. May repeat in 2 hours if headache persists or recurs., Disp: 10 tablet, Rfl: 5  EXAM:  VITALS per patient if applicable:  GENERAL: alert, oriented, appears well and in no acute distress  HEENT: atraumatic, conjunttiva clear, no obvious abnormalities on inspection of external nose and ears  NECK: normal movements of the head and neck  LUNGS: on inspection no signs of respiratory distress, breathing rate appears normal, no obvious gross SOB, gasping or wheezing  CV: no obvious cyanosis  MS: moves all visible extremities without noticeable abnormality  PSYCH/NEURO: pleasant and cooperative, no obvious depression or anxiety, speech and thought processing grossly intact  ASSESSMENT AND PLAN:  Discussed the following assessment and plan:   Recent onset vomiting and diarrhea.  Suspect viral gastroenteritis.  She had several contacts with individuals that had the same symptoms.  Overall she has some improved in terms of less vomiting and diarrhea improved.  She had low potassium  3.1 and mildly elevated liver transaminases.  She is describing symptoms consistent with possible yeast vaginitis.  We discussed the following  -Continue fluid and electrolyte replacement.  She does have electrolyte replacement drink that she is kept down today -Zofran 8 mg ODT 1 every 8 hours as needed for nausea -Agreed to send in fluconazole 150 mg x 1 tablet for yeast vaginitis symptoms -Bland diet and gradually progress as tolerated -We suggested that she follow-up with primary in a month or so to recheck her hepatic panel to make sure this is coming back down but suspect this is probably related to her recent viral infection    I discussed the assessment and treatment plan with the patient. The patient was provided an opportunity to ask questions and all were answered. The patient agreed with the plan and demonstrated an understanding of the instructions.   The patient was advised to call back or seek an in-person evaluation if the symptoms worsen or if the condition fails to improve as anticipated.  Carolann Littler, MD

## 2020-11-11 ENCOUNTER — Ambulatory Visit (INDEPENDENT_AMBULATORY_CARE_PROVIDER_SITE_OTHER): Payer: 59 | Admitting: Family Medicine

## 2020-11-11 ENCOUNTER — Encounter: Payer: Self-pay | Admitting: Family Medicine

## 2020-11-11 ENCOUNTER — Other Ambulatory Visit: Payer: Self-pay

## 2020-11-11 VITALS — BP 118/62 | HR 76 | Temp 98.4°F | Wt 158.0 lb

## 2020-11-11 DIAGNOSIS — F419 Anxiety disorder, unspecified: Secondary | ICD-10-CM

## 2020-11-11 DIAGNOSIS — F32A Depression, unspecified: Secondary | ICD-10-CM | POA: Diagnosis not present

## 2020-11-11 DIAGNOSIS — R5383 Other fatigue: Secondary | ICD-10-CM | POA: Diagnosis not present

## 2020-11-11 DIAGNOSIS — R319 Hematuria, unspecified: Secondary | ICD-10-CM

## 2020-11-11 LAB — T4, FREE: Free T4: 0.78 ng/dL (ref 0.60–1.60)

## 2020-11-11 LAB — BASIC METABOLIC PANEL
BUN: 10 mg/dL (ref 6–23)
CO2: 30 mEq/L (ref 19–32)
Calcium: 10.3 mg/dL (ref 8.4–10.5)
Chloride: 101 mEq/L (ref 96–112)
Creatinine, Ser: 0.65 mg/dL (ref 0.40–1.20)
GFR: 122.71 mL/min (ref >60.00)
Glucose, Bld: 78 mg/dL (ref 70–99)
Potassium: 4 mEq/L (ref 3.5–5.1)
Sodium: 139 mEq/L (ref 135–145)

## 2020-11-11 LAB — POC URINALSYSI DIPSTICK (AUTOMATED)
Bilirubin, UA: NEGATIVE
Blood, UA: NEGATIVE
Glucose, UA: NEGATIVE
Ketones, UA: NEGATIVE
Leukocytes, UA: NEGATIVE
Nitrite, UA: NEGATIVE
Protein, UA: NEGATIVE
Spec Grav, UA: >=1.03 — AB (ref 1.010–1.025)
Urobilinogen, UA: 0.2 E.U./dL
pH, UA: 6 (ref 5.0–8.0)

## 2020-11-11 LAB — HEPATIC FUNCTION PANEL
ALT: 24 U/L (ref 0–35)
AST: 15 U/L (ref 0–37)
Albumin: 4.2 g/dL (ref 3.5–5.2)
Alkaline Phosphatase: 63 U/L (ref 39–117)
Bilirubin, Direct: 0.1 mg/dL (ref 0.0–0.3)
Total Bilirubin: 0.3 mg/dL (ref 0.2–1.2)
Total Protein: 6.9 g/dL (ref 6.0–8.3)

## 2020-11-11 LAB — TSH: TSH: 1.56 u[IU]/mL (ref 0.35–4.50)

## 2020-11-11 LAB — T3, FREE: T3, Free: 3.4 pg/mL (ref 2.3–4.2)

## 2020-11-11 MED ORDER — BUPROPION HCL ER (XL) 150 MG PO TB24: 150.0000 mg | ORAL_TABLET | Freq: Every day | ORAL | 5 refills | Status: DC

## 2020-11-11 NOTE — Progress Notes (Signed)
   Subjective:    Patient ID: Alexandra Meadows, female    DOB: 01-Mar-1996, 25 y.o.   MRN: [id number]  HPI Here for several issues. First she was seen virtually on 10-21-20 for an enteritis that caused vomiting and diarrhea. She had labs then that showed a low potassium at 3.1 and elevated AST/ ALT at 44/55. Since then the NVD has stopped but she still feels very fatigued. She asks [place] to check a urine sample today. Also she asks to try a different medication for her depression and anxiety. She has tried Lexapro and Zoloft, but these caused side effects like a "zombie" effect and sweating.    Review of Systems  Constitutional: Positive for fatigue.  Respiratory: Negative.   Cardiovascular: Negative.   Gastrointestinal: Negative.   Genitourinary: Negative.   Psychiatric/Behavioral: Positive for decreased concentration and dysphoric mood. Negative for agitation and sleep disturbance. The patient is nervous/anxious.        Objective:   Physical Exam Constitutional:      Appearance: Normal appearance.  Cardiovascular:     Rate and Rhythm: Normal rate and regular rhythm.     Pulses: Normal pulses.     Heart sounds: Normal heart sounds.  Pulmonary:     Effort: Pulmonary effort is normal.     Breath sounds: Normal breath sounds.  Neurological:     General: No focal deficit present.     Mental Status: She is alert and oriented to person, place, and time.  Psychiatric:        Mood and Affect: Mood normal.        Behavior: Behavior normal.        Thought Content: Thought content normal.        Judgment: Judgment normal.           Assessment & Plan:  She seems to have recovered from an enteritis, but we will check labs today for her continued fatigue. For anxiety and depression, she will try Wellbutrin XL 150 mg daily. Recheck in 3-4 weeks.  Gershon Crane, MD

## 2020-12-02 ENCOUNTER — Telehealth (INDEPENDENT_AMBULATORY_CARE_PROVIDER_SITE_OTHER): Payer: 59 | Admitting: Family Medicine

## 2020-12-02 ENCOUNTER — Encounter: Payer: Self-pay | Admitting: Family Medicine

## 2020-12-02 DIAGNOSIS — L03116 Cellulitis of left lower limb: Secondary | ICD-10-CM | POA: Diagnosis not present

## 2020-12-02 MED ORDER — DOXYCYCLINE HYCLATE 100 MG PO CAPS: 100.0000 mg | ORAL_CAPSULE | Freq: Two times a day (BID) | ORAL | 0 refills | Status: AC

## 2020-12-02 NOTE — Progress Notes (Signed)
Subjective:    Patient ID: Alexandra Meadows, female    DOB: 04-May-1996, 25 y.o.   MRN: [id number]  HPI Virtual Visit via Video Note  I connected with the patient on 12/02/20 at  1:45 PM EDT by a video enabled telemedicine application and verified that I am speaking with the correct person using two identifiers.  Location patient: home Location provider:work or home office Persons participating in the virtual visit: patient, provider  I discussed the limitations of evaluation and management by telemedicine and the availability of in person appointments. The patient expressed understanding and agreed to proceed.   HPI: Here for an infection around a tattoo site. On 11-17-20 she got a tattoo on the left anterior thigh. About one week ago it became irritated and painful, and now there is a red rash around it. She feels well otherwise, no fever.    ROS: See pertinent positives and negatives per HPI.  Past Medical History:  Diagnosis Date  . Anemia   . Anxiety   . Asthma    prn inhaler; exercise induced  . Carpal tunnel syndrome   . Depression    severe at 20weeks  . 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 Infection    BV with preg; recurrent yeast inf  . Migraines   . Seasonal allergies   . Tonsillar hypertrophy 04/2016    Past Surgical History:  Procedure Laterality Date  . KNEE ARTHROSCOPY Left 02/28/2014   Procedure: ARTHROSCOPY LEFT KNEE WITH SYNOVECTOMY LIMITED, PARTIAL LATERAL MENISECTOMY;  Surgeon: Loreta Ave, MD;  Location: [facility];  Service: Orthopedics;  Laterality: Left;  . TONSILLECTOMY AND ADENOIDECTOMY Bilateral 05/25/2016   Procedure: TONSILLECTOMY AND ADENOIDECTOMY;  Surgeon: Newman Pies, MD;  Location: [facility];  Service: ENT;  Laterality: Bilateral;  . WISDOM TOOTH EXTRACTION  2015    Family History  Problem Relation Age of Onset  . Diabetes Mother   . Cancer Maternal Grandfather       Current Outpatient Medications:  .  butalbital-aspirin-caffeine (FIORINAL) 50-325-40 MG capsule, Take 1 capsule by mouth every 6 (six) hours as needed for headache., Disp: 60 capsule, Rfl: 1 .  cyclobenzaprine (FLEXERIL) 10 MG tablet, Take 1 tablet (10 mg total) by mouth 3 (three) times daily as needed for muscle spasms., Disp: 60 tablet, Rfl: 5 .  diphenhydrAMINE HCl (BENADRYL PO), Take 1 tablet by mouth as needed., Disp: , Rfl:  .  doxycycline (VIBRAMYCIN) 100 MG capsule, Take 1 capsule (100 mg total) by mouth 2 (two) times daily for 10 days., Disp: 20 capsule, Rfl: 0 .  drospirenone-ethinyl estradiol (YAZ) 3-0.02 MG tablet, Take 1 tablet by mouth daily., Disp: 90 tablet, Rfl: 3 .  fluticasone (FLONASE) 50 MCG/ACT nasal spray, Place 2 sprays into both nostrils daily., Disp: , Rfl:  .  Levocetirizine Dihydrochloride (XYZAL ALLERGY 24HR PO), Take 1 tablet by mouth daily., Disp: , Rfl:  .  meclizine (ANTIVERT) 25 MG tablet, Take 1 tablet (25 mg total) by mouth every 4 (four) hours as needed for dizziness., Disp: 60 tablet, Rfl: 0 .  ondansetron (ZOFRAN ODT) 8 MG disintegrating tablet, Take 1 tablet (8 mg total) by mouth every 8 (eight) hours as needed for nausea or vomiting., Disp: 12 tablet, Rfl: 0 .  SUMAtriptan (IMITREX) 100 MG tablet, Take 1 tablet (100 mg total) by mouth as needed for migraine. May repeat in 2 hours if headache persists or recurs.,  Disp: 10 tablet, Rfl: 5  EXAM:  VITALS per patient if applicable:  GENERAL: alert, oriented, appears well and in no acute distress  HEENT: atraumatic, conjunttiva clear, no obvious abnormalities on inspection of external nose and ears  NECK: normal movements of the head and neck  LUNGS: on inspection no signs of respiratory distress, breathing rate appears normal, no obvious gross SOB, gasping or wheezing  CV: no obvious cyanosis  MS: moves all visible extremities without noticeable abnormality  PSYCH/NEURO: pleasant and  cooperative, no obvious depression or anxiety, speech and thought processing grossly intact SKIN: the anterior left thigh has a tattoo which is surrounded by a zone of erythema and there are several small pustules present  ASSESSMENT AND PLAN: Cellulitis, treat with 10 days of Doxycycline.  Gershon Crane, MD  Discussed the following assessment and plan:  No diagnosis found.     I discussed the assessment and treatment plan with the patient. The patient was provided an opportunity to ask questions and all were answered. The patient agreed with the plan and demonstrated an understanding of the instructions.   The patient was advised to call back or seek an in-person evaluation if the symptoms worsen or if the condition fails to improve as anticipated.     Review of Systems     Objective:   Physical Exam        Assessment & Plan:

## 2020-12-24 ENCOUNTER — Other Ambulatory Visit: Payer: Self-pay | Admitting: Family Medicine

## 2020-12-26 ENCOUNTER — Telehealth (INDEPENDENT_AMBULATORY_CARE_PROVIDER_SITE_OTHER): Payer: 59 | Admitting: Family Medicine

## 2020-12-26 ENCOUNTER — Encounter: Payer: Self-pay | Admitting: Family Medicine

## 2020-12-26 DIAGNOSIS — G43009 Migraine without aura, not intractable, without status migrainosus: Secondary | ICD-10-CM | POA: Diagnosis not present

## 2020-12-26 MED ORDER — RIZATRIPTAN BENZOATE 10 MG PO TABS: 10.0000 mg | ORAL_TABLET | ORAL | 3 refills | Status: DC | PRN

## 2020-12-26 MED ORDER — TOPIRAMATE 25 MG PO TABS: 25.0000 mg | ORAL_TABLET | Freq: Every day | ORAL | 2 refills | Status: DC

## 2020-12-26 NOTE — Progress Notes (Signed)
Subjective:    Patient ID: Alexandra Meadows, female    DOB: [birth year], 25 y.o.   MRN: [id number]  HPI Virtual Visit via Video Note  I connected with the patient on 12/26/20 at  3:15 PM EDT by a video enabled telemedicine application and verified that I am speaking with the correct person using two identifiers.  Location patient: home Location provider:work or home office Persons participating in the virtual visit: patient, provider  I discussed the limitations of evaluation and management by telemedicine and the availability of in person appointments. The patient expressed understanding and agreed to proceed.   HPI: Here asking for help with headaches. Her migraines have been coming more frequently the past few months, and no she averages one a week. She is interested in trying a prophylactic agent. She has used Sumatriptan and Fioricet with little relief.    ROS: See pertinent positives and negatives per HPI.  Past Medical History:  Diagnosis Date  . Anemia   . Anxiety   . Asthma    prn inhaler; exercise induced  . Carpal tunnel syndrome   . Depression    severe at 20weeks  . 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 Infection    BV with preg; recurrent yeast inf  . Migraines   . Seasonal allergies   . Tonsillar hypertrophy 04/2016    Past Surgical History:  Procedure Laterality Date  . KNEE ARTHROSCOPY Left 02/28/2014   Procedure: ARTHROSCOPY LEFT KNEE WITH SYNOVECTOMY LIMITED, PARTIAL LATERAL MENISECTOMY;  Surgeon: Loreta Ave, MD;  Location: [facility];  Service: Orthopedics;  Laterality: Left;  . TONSILLECTOMY AND ADENOIDECTOMY Bilateral 05/25/2016   Procedure: TONSILLECTOMY AND ADENOIDECTOMY;  Surgeon: Newman Pies, MD;  Location: [facility];  Service: ENT;  Laterality: Bilateral;  . WISDOM TOOTH EXTRACTION  2015    Family History  Problem Relation Age of Onset  . Diabetes Mother   . Cancer  Maternal Grandfather      Current Outpatient Medications:  .  butalbital-aspirin-caffeine (FIORINAL) 50-325-40 MG capsule, Take 1 capsule by mouth every 6 (six) hours as needed for headache., Disp: 60 capsule, Rfl: 1 .  cyclobenzaprine (FLEXERIL) 10 MG tablet, TAKE ONE TABLET BY MOUTH THREE TIMES A DAY AS NEEDED FOR MUSCLE SPASMS, Disp: 60 tablet, Rfl: 3 .  diphenhydrAMINE HCl (BENADRYL PO), Take 1 tablet by mouth as needed., Disp: , Rfl:  .  drospirenone-ethinyl estradiol (YAZ) 3-0.02 MG tablet, Take 1 tablet by mouth daily., Disp: 90 tablet, Rfl: 3 .  fluticasone (FLONASE) 50 MCG/ACT nasal spray, Place 2 sprays into both nostrils daily., Disp: , Rfl:  .  Levocetirizine Dihydrochloride (XYZAL ALLERGY 24HR PO), Take 1 tablet by mouth daily., Disp: , Rfl:  .  meclizine (ANTIVERT) 25 MG tablet, Take 1 tablet (25 mg total) by mouth every 4 (four) hours as needed for dizziness., Disp: 60 tablet, Rfl: 0 .  ondansetron (ZOFRAN ODT) 8 MG disintegrating tablet, Take 1 tablet (8 mg total) by mouth every 8 (eight) hours as needed for nausea or vomiting., Disp: 12 tablet, Rfl: 0 .  rizatriptan (MAXALT) 10 MG tablet, Take 1 tablet (10 mg total) by mouth as needed for migraine. May repeat in 2 hours if needed, Disp: 10 tablet, Rfl: 3 .  topiramate (TOPAMAX) 25 MG tablet, Take 1 tablet (25 mg total) by mouth at bedtime., Disp: 30 tablet, Rfl: 2  EXAM:  VITALS per patient if applicable:  GENERAL: alert, oriented, appears well and in no acute distress  HEENT: atraumatic, conjunttiva clear, no obvious abnormalities on inspection of external nose and ears  NECK: normal movements of the head and neck  LUNGS: on inspection no signs of respiratory distress, breathing rate appears normal, no obvious gross SOB, gasping or wheezing  CV: no obvious cyanosis  MS: moves all visible extremities without noticeable abnormality  PSYCH/NEURO: pleasant and cooperative, no obvious depression or anxiety, speech and  thought processing grossly intact  ASSESSMENT AND PLAN: For acute migraines, she will try Maxalt 10 mg as needed. For prophylaxis, she will try Topamax 25 mg daily at bedtime. Report back in 3-4 weeks.  Gershon Crane, MD  Discussed the following assessment and plan:  No diagnosis found.     I discussed the assessment and treatment plan with the patient. The patient was provided an opportunity to ask questions and all were answered. The patient agreed with the plan and demonstrated an understanding of the instructions.   The patient was advised to call back or seek an in-person evaluation if the symptoms worsen or if the condition fails to improve as anticipated.     Review of Systems     Objective:   Physical Exam        Assessment & Plan:

## 2021-01-15 ENCOUNTER — Telehealth (INDEPENDENT_AMBULATORY_CARE_PROVIDER_SITE_OTHER): Payer: 59 | Admitting: Family Medicine

## 2021-01-15 DIAGNOSIS — R300 Dysuria: Secondary | ICD-10-CM

## 2021-01-15 MED ORDER — NITROFURANTOIN MONOHYD MACRO 100 MG PO CAPS: 100.0000 mg | ORAL_CAPSULE | Freq: Two times a day (BID) | ORAL | 0 refills | Status: DC

## 2021-01-15 NOTE — Progress Notes (Signed)
Virtual Visit via Video Note  I connected with Alexandra Meadows  on 01/15/21 at  3:40 PM EDT by a video enabled telemedicine application and verified that I am speaking with the correct person using two identifiers.  Location patient: home, [place] Location provider:work or home office Persons participating in the virtual visit: patient, provider  I discussed the limitations of evaluation and management by telemedicine and the availability of in person appointments. The patient expressed understanding and agreed to proceed.   HPI:  Acute telemedicine visit for dysuria: -Onset: a few days ago -Symptoms include: dysuria, burning with urination, ? Tiny bit of hematuria - pinkish on TP  -FDLMP 2 weeks ago -Denies: fevers, flank pain, vomiting, vaginal discharge -no concern for STIs, not sexually active -Pertinent past medical history: hx of UTIs, this feels similar for her -Pertinent medication allergies: Allergies  Allergen Reactions   Alprazolam Other (See Comments)    SEIZURES Other reaction(s): Other (See Comments) SEIZURES    Bee Venom Hives and Swelling   Tetanus Toxoids Hives    ROS: See pertinent positives and negatives per HPI.  Past Medical History:  Diagnosis Date   Anemia    Anxiety    Asthma    prn inhaler; exercise induced   Carpal tunnel syndrome    Depression    severe at 20weeks   History of seizures    states caused by Xanax - no seizures since 2015   Infection    BV with preg; recurrent yeast inf   Migraines    Seasonal allergies    Tonsillar hypertrophy 04/2016    Past Surgical History:  Procedure Laterality Date   KNEE ARTHROSCOPY Left 02/28/2014   Procedure: ARTHROSCOPY LEFT KNEE WITH SYNOVECTOMY LIMITED, PARTIAL LATERAL MENISECTOMY;  Surgeon: Loreta Ave, MD;  Location: [facility];  Service: Orthopedics;  Laterality: Left;   TONSILLECTOMY AND ADENOIDECTOMY Bilateral 05/25/2016   Procedure: TONSILLECTOMY AND ADENOIDECTOMY;  Surgeon: Newman Pies, MD;  Location: [facility];  Service: ENT;  Laterality: Bilateral;   WISDOM TOOTH EXTRACTION  2015     Current Outpatient Medications:    nitrofurantoin, macrocrystal-monohydrate, (MACROBID) 100 MG capsule, Take 1 capsule (100 mg total) by mouth 2 (two) times daily., Disp: 14 capsule, Rfl: 0   butalbital-aspirin-caffeine (FIORINAL) 50-325-40 MG capsule, Take 1 capsule by mouth every 6 (six) hours as needed for headache., Disp: 60 capsule, Rfl: 1   cyclobenzaprine (FLEXERIL) 10 MG tablet, TAKE ONE TABLET BY MOUTH THREE TIMES A DAY AS NEEDED FOR MUSCLE SPASMS, Disp: 60 tablet, Rfl: 3   diphenhydrAMINE HCl (BENADRYL PO), Take 1 tablet by mouth as needed., Disp: , Rfl:    drospirenone-ethinyl estradiol (YAZ) 3-0.02 MG tablet, Take 1 tablet by mouth daily., Disp: 90 tablet, Rfl: 3   fluticasone (FLONASE) 50 MCG/ACT nasal spray, Place 2 sprays into both nostrils daily., Disp: , Rfl:    Levocetirizine Dihydrochloride (XYZAL ALLERGY 24HR PO), Take 1 tablet by mouth daily., Disp: , Rfl:    meclizine (ANTIVERT) 25 MG tablet, Take 1 tablet (25 mg total) by mouth every 4 (four) hours as needed for dizziness., Disp: 60 tablet, Rfl: 0   ondansetron (ZOFRAN ODT) 8 MG disintegrating tablet, Take 1 tablet (8 mg total) by mouth every 8 (eight) hours as needed for nausea or vomiting., Disp: 12 tablet, Rfl: 0   rizatriptan (MAXALT) 10 MG tablet, Take 1 tablet (10 mg total) by mouth as needed for migraine. May repeat in 2 hours if needed, Disp: 10  tablet, Rfl: 3   topiramate (TOPAMAX) 25 MG tablet, Take 1 tablet (25 mg total) by mouth at bedtime., Disp: 30 tablet, Rfl: 2  EXAM:  VITALS per patient if applicable:  GENERAL: alert, oriented, appears well and in no acute distress  HEENT: atraumatic, conjunttiva clear, no obvious abnormalities on inspection of external nose and ears  NECK: normal movements of the head and neck  LUNGS: on inspection no signs of respiratory distress, breathing  rate appears normal, no obvious gross SOB, gasping or wheezing  CV: no obvious cyanosis  MS: moves all visible extremities without noticeable abnormality  PSYCH/NEURO: pleasant and cooperative, no obvious depression or anxiety, speech and thought processing grossly intact  ASSESSMENT AND PLAN:  Discussed the following assessment and plan:  Dysuria  -we discussed possible serious and likely etiologies, options for evaluation and workup, limitations of telemedicine visit vs in person visit, treatment, treatment risks and precautions. Pt prefers to treat via telemedicine empirically rather than in person at this moment. She prefers to try empiric treatment for possible uncomplicated cystitis. No recent Ucx to review. Opted for Macrobid 100mg  bid x 7 days. She agrees to seek prompt in person care if worsening, new symptoms arise, or if is not improving with treatment. Discussed options for inperson care if PCP office not available. Did let this patient know that I only do telemedicine on Tuesdays and Thursdays for [place]. Advised to schedule follow up visit with PCP or [facility] if any further questions or concerns to avoid delays in care.   I discussed the assessment and treatment plan with the patient. The patient was provided an opportunity to ask questions and all were answered. The patient agreed with the plan and demonstrated an understanding of the instructions.     12-26-1979, DO

## 2021-01-15 NOTE — Patient Instructions (Signed)
-  I sent the medication(s) we discussed to your pharmacy: Meds ordered this encounter  Medications   nitrofurantoin, macrocrystal-monohydrate, (MACROBID) 100 MG capsule    Sig: Take 1 capsule (100 mg total) by mouth 2 (two) times daily.    Dispense:  14 capsule    Refill:  0     I hope you are feeling better soon!  Seek in person care promptly if your symptoms worsen, new concerns arise or you are not improving with treatment.  It was nice to meet you today. I help [place] out with telemedicine visits on Tuesdays and Thursdays and am available for visits on those days. If you have any concerns or questions following this visit please schedule a follow up visit with your Primary Care doctor or seek care at a local urgent care clinic to avoid delays in care.   

## 2021-02-17 ENCOUNTER — Other Ambulatory Visit: Payer: Self-pay

## 2021-02-17 ENCOUNTER — Encounter: Payer: Self-pay | Admitting: Family Medicine

## 2021-02-17 ENCOUNTER — Ambulatory Visit (INDEPENDENT_AMBULATORY_CARE_PROVIDER_SITE_OTHER): Payer: 59 | Admitting: Family Medicine

## 2021-02-17 VITALS — BP 120/80 | HR 117 | Temp 98.2°F | Ht 64.0 in | Wt 152.2 lb

## 2021-02-17 DIAGNOSIS — L02429 Furuncle of limb, unspecified: Secondary | ICD-10-CM | POA: Diagnosis not present

## 2021-02-17 MED ORDER — SULFAMETHOXAZOLE-TRIMETHOPRIM 800-160 MG PO TABS: 1.0000 | ORAL_TABLET | Freq: Two times a day (BID) | ORAL | 0 refills | Status: DC

## 2021-02-17 NOTE — Progress Notes (Signed)
   Subjective:    Patient ID: Alexandra Meadows, female    DOB: [birth year], 25 y.o.   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 We had a virtual visit with her in May for a cellulitis on the legs and we gave her a course of Doxycycline. This caused a lot of GI upset and nausea, so she stopped taking it after 7 days. However it seemed to work and her skin cleared up. Now for the past 3 days she has had another tender boil come up on the left thigh. No fever.    Review of Systems  Constitutional: Negative.   Respiratory: Negative.    Cardiovascular: Negative.       Objective:   Physical Exam Constitutional:      Appearance: Normal appearance.  Cardiovascular:     Rate and Rhythm: Normal rate and regular rhythm.     Pulses: Normal pulses.     Heart sounds: Normal heart sounds.  Pulmonary:     Effort: Pulmonary effort is normal.     Breath sounds: Normal breath sounds.  Skin:    Comments: There is a tender boil on the medial left thigh that is red and warm  Neurological:     Mental Status: She is alert.          Assessment & Plan:  Boil, likely due to MRSA. We will treat this with 10 days of Bactrim DS. Recheck as needed.  Gershon Crane, MD

## 2021-03-10 ENCOUNTER — Other Ambulatory Visit: Payer: Self-pay | Admitting: Family Medicine

## 2021-04-04 ENCOUNTER — Other Ambulatory Visit: Payer: Self-pay | Admitting: Family Medicine

## 2021-04-20 ENCOUNTER — Other Ambulatory Visit: Payer: Self-pay | Admitting: Family Medicine

## 2021-04-20 NOTE — Telephone Encounter (Signed)
PT called to request a refill of there birth control to be called in asap as she is out and needs it to be called into the [facility] [id number] - [place], [place] - [address]

## 2021-05-05 ENCOUNTER — Encounter: Payer: Self-pay | Admitting: Family Medicine

## 2021-05-05 ENCOUNTER — Other Ambulatory Visit: Payer: Self-pay

## 2021-05-05 ENCOUNTER — Ambulatory Visit
Admission: EM | Admit: 2021-05-05 | Discharge: 2021-05-05 | Disposition: A | Discharge status: Home / Self Care | Payer: 59 | Attending: Emergency Medicine | Admitting: Emergency Medicine

## 2021-05-05 ENCOUNTER — Telehealth (INDEPENDENT_AMBULATORY_CARE_PROVIDER_SITE_OTHER): Payer: 59 | Admitting: Family Medicine

## 2021-05-05 ENCOUNTER — Emergency Department (HOSPITAL_BASED_OUTPATIENT_CLINIC_OR_DEPARTMENT_OTHER)
Admission: EM | Admit: 2021-05-05 | Discharge: 2021-05-05 | Disposition: A | Discharge status: Home / Self Care | Payer: 59 | Attending: Emergency Medicine | Admitting: Emergency Medicine

## 2021-05-05 ENCOUNTER — Encounter (HOSPITAL_BASED_OUTPATIENT_CLINIC_OR_DEPARTMENT_OTHER): Payer: Self-pay | Admitting: Emergency Medicine

## 2021-05-05 ENCOUNTER — Other Ambulatory Visit: Payer: Self-pay | Admitting: Family Medicine

## 2021-05-05 ENCOUNTER — Telehealth: Payer: Self-pay

## 2021-05-05 ENCOUNTER — Emergency Department (HOSPITAL_BASED_OUTPATIENT_CLINIC_OR_DEPARTMENT_OTHER): Payer: 59 | Admitting: Radiology

## 2021-05-05 ENCOUNTER — Telehealth: Payer: Self-pay | Admitting: Family Medicine

## 2021-05-05 VITALS — Wt 152.4 lb

## 2021-05-05 DIAGNOSIS — U071 COVID-19: Secondary | ICD-10-CM | POA: Insufficient documentation

## 2021-05-05 DIAGNOSIS — Z7951 Long term (current) use of inhaled steroids: Secondary | ICD-10-CM | POA: Diagnosis not present

## 2021-05-05 DIAGNOSIS — J45909 Unspecified asthma, uncomplicated: Secondary | ICD-10-CM | POA: Diagnosis not present

## 2021-05-05 DIAGNOSIS — R519 Headache, unspecified: Secondary | ICD-10-CM | POA: Diagnosis present

## 2021-05-05 MED ORDER — KETOROLAC TROMETHAMINE 15 MG/ML IJ SOLN
15.0000 mg | Freq: Once | INTRAMUSCULAR | Status: AC
  Administered 2021-05-05: 15 mg via INTRAMUSCULAR
  Filled 2021-05-05: qty 1

## 2021-05-05 MED ORDER — DEXAMETHASONE 6 MG PO TABS: 6.0000 mg | ORAL_TABLET | Freq: Every day | ORAL | 0 refills | Status: AC

## 2021-05-05 MED ORDER — ALBUTEROL SULFATE HFA 108 (90 BASE) MCG/ACT IN AERS
2.0000 | INHALATION_SPRAY | RESPIRATORY_TRACT | Status: DC | PRN
  Administered 2021-05-05: 2 via RESPIRATORY_TRACT
  Filled 2021-05-05: qty 6.7

## 2021-05-05 MED ORDER — PROCHLORPERAZINE EDISYLATE 10 MG/2ML IJ SOLN
10.0000 mg | Freq: Once | INTRAMUSCULAR | Status: AC
  Administered 2021-05-05: 10 mg via INTRAMUSCULAR
  Filled 2021-05-05: qty 2

## 2021-05-05 NOTE — ED Provider Notes (Signed)
[facility]    CSN: [id number] Arrival date & time: 05/05/21  1044      History   Chief Complaint Chief Complaint  Patient presents with   Cough    HPI Alexandra Meadows is a 25 y.o. female.   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  Patient complains of her chest feeling tight.  Patient reports a history of asthma.  Patient states she has been using her albuterol inhaler.  Patient states she called EMS earlier this morning who listened to her lungs and advised her she was wheezing, states they gave her an albuterol treatment.  Patient's vital signs are normal today with a regular respiratory rate, regular pulse and oxygen saturation of 98%.  Is nontoxic appearing and mentating well.  Patient is requesting medication for COVID-19.    Past Medical History:  Diagnosis Date   Anemia    Anxiety    Asthma    prn inhaler; exercise induced   Carpal tunnel syndrome    Depression    severe at 20weeks   History of seizures    states caused by Xanax - no seizures since 2015   Infection    BV with preg; recurrent yeast inf   Migraines    Seasonal allergies    Tonsillar hypertrophy 04/2016    Patient Active Problem List   Diagnosis Date Noted   Vertigo 08/07/2020   Environmental and seasonal allergies 03/10/2020   Chronic bilateral thoracic back pain 08/25/2018   Vaginal bleeding 01/21/2018   SVD (spontaneous vaginal delivery) 12/27/2017   Uterine size date discrepancy pregnancy, third trimester    Umbilical vein abnormality affecting pregnancy    Vitamin D deficiency 07/05/2017   Supervision of high-risk pregnancy 06/30/2017   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   Anxiety and depression 02/17/2014   INSOMNIA 03/20/2008   ADHD 08/29/2007   Migraine without aura 03/29/2007    Past Surgical History:  Procedure Laterality Date   KNEE ARTHROSCOPY Left 02/28/2014   Procedure:  ARTHROSCOPY LEFT KNEE WITH SYNOVECTOMY LIMITED, PARTIAL LATERAL MENISECTOMY;  Surgeon: Loreta Ave, MD;  Location: [facility];  Service: Orthopedics;  Laterality: Left;   TONSILLECTOMY AND ADENOIDECTOMY Bilateral 05/25/2016   Procedure: TONSILLECTOMY AND ADENOIDECTOMY;  Surgeon: Newman Pies, MD;  Location: [facility];  Service: ENT;  Laterality: Bilateral;   WISDOM TOOTH EXTRACTION  2015    OB History     Gravida  2   Para  2   Term  2   Preterm      AB      Living  2      SAB      IAB      Ectopic      Multiple  0   Live Births  2            Home Medications    Prior to Admission medications   Medication Sig Start Date End Date Taking? Authorizing Provider  dexamethasone (DECADRON) 6 MG tablet Take 1 tablet (6 mg total) by mouth daily for 5 days. 05/05/21 05/10/21 Yes Theadora Rama Scales, PA-C  butalbital-aspirin-caffeine [facility]) [phone] MG capsule Take 1 capsule by mouth every 6 (six) hours as needed for headache. 10/15/19   Nelwyn Salisbury, MD  cyclobenzaprine (FLEXERIL) 10 MG tablet TAKE ONE TABLET BY MOUTH THREE TIMES A DAY AS NEEDED FOR MUSCLE SPASMS 12/24/20   Clent Ridges,  Tera Mater, MD  diphenhydrAMINE HCl (BENADRYL PO) Take 1 tablet by mouth as needed.    [provider]  Escitalopram Oxalate (LEXAPRO PO) Take by mouth daily.    [provider]  fluticasone (FLONASE) 50 MCG/ACT nasal spray Place 2 sprays into both nostrils daily.    [provider]  gabapentin (NEURONTIN) 300 MG capsule Take 300 mg by mouth 2 (two) times daily. 04/09/21   [provider]  Levocetirizine Dihydrochloride (XYZAL ALLERGY 24HR PO) Take 1 tablet by mouth daily.    [provider]  meloxicam (MOBIC) 15 MG tablet Take 15 mg by mouth daily as needed. 04/27/21   [provider]  [name] 3-0.02 MG tablet TAKE ONE TABLET BY MOUTH DAILY 04/20/21   Nelwyn Salisbury, MD  ondansetron (ZOFRAN ODT) 8 MG disintegrating  tablet Take 1 tablet (8 mg total) by mouth every 8 (eight) hours as needed for nausea or vomiting. 10/21/20   Burchette, Elberta Fortis, MD  rizatriptan (MAXALT) 10 MG tablet Take 1 tablet (10 mg total) by mouth as needed for migraine. May repeat in 2 hours if needed 12/26/20   Nelwyn Salisbury, MD  topiramate (TOPAMAX) 25 MG tablet TAKE ONE TABLET BY MOUTH EVERY NIGHT AT BEDTIME 04/06/21   Nelwyn Salisbury, MD    Family History Family History  Problem Relation Age of Onset   Diabetes Mother    Cancer Maternal Grandfather     Social History Social History   Tobacco Use   Smoking status: Never   Smokeless tobacco: Never   Tobacco comments:    quit  Substance Use Topics   Alcohol use: Yes    Alcohol/week: 0.0 standard drinks    Comment: occ   Drug use: No     Allergies   Alprazolam, Bee venom, and Tetanus toxoids   Review of Systems Review of Systems Pertinent findings noted in history of present illness.    Physical Exam Triage Vital Signs ED Triage Vitals  Enc Vitals Group     BP      Pulse      Resp      Temp      Temp src      SpO2      Weight      Height      Head Circumference      Peak Flow      Pain Score      Pain Loc      Pain Edu?      Excl. in GC?    No data found.  Updated Vital Signs BP 121/74 (BP Location: Right Arm)   Pulse 80   Temp 98.1 F (36.7 C) (Oral)   Resp 20   Wt 154 lb (69.9 kg)   LMP 04/15/2021 (Approximate)   SpO2 98%   BMI 26.43 kg/m   Visual Acuity Right Eye Distance:   Left Eye Distance:   Bilateral Distance:    Right Eye Near:   Left Eye Near:    Bilateral Near:     Physical Exam Vitals and nursing note (Physical exam declined given recency of physical examination by EMS and because patient has a COVID-19 infection diagnosed 3 days ago.) reviewed.     UC Treatments / Results  Labs (all labs ordered are listed, but only abnormal results are displayed) Labs Reviewed - No data to display  EKG   Radiology No  results found.  Procedures Procedures (including critical care time)  Medications Ordered  in UC Medications - No data to display  Initial Impression / Assessment and Plan / UC Course  I have reviewed the triage vital signs and the nursing notes.  Pertinent labs & imaging results that were available during my care of the patient were reviewed by me and considered in my medical decision making (see chart for details).     Patient advised to go home and stay home until she completes her 5 days of quarantine or until her symptoms fully resolved without medication, which ever comes last.  I advised patient that I would send a prescription for dexamethasone to her pharmacy although I advised her I do not feel is indicated given her normal vital signs and nontoxic appearance.  Patient states that she will have to switch pharmacies because her current pharmacy does not have a drive-through window.  Prescriptions were sent to [facility] [address].  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    Patient verbalized understanding of plan as discussed.  All questions were addressed during visit.  Patient left exam room prior to receiving AVS. Final Clinical Impressions(s) / UC Diagnoses   Final diagnoses:  COVID-19   Discharge Instructions   None    ED Prescriptions     Medication Sig Dispense Auth. Provider   dexamethasone (DECADRON) 6 MG tablet Take 1 tablet (6 mg total) by mouth daily for 5 days. 5 tablet Theadora Rama Scales, PA-C      PDMP not reviewed this encounter.   Theadora Rama Scales,  PA-C 05/05/21 1118

## 2021-05-05 NOTE — ED Notes (Signed)
Dr pickering at bedside  

## 2021-05-05 NOTE — ED Provider Notes (Signed)
[facility] EMERGENCY DEPT Provider Note   CSN: [id number] Arrival date & time: 05/05/21  1320     History Chief Complaint  Patient presents with   Shortness of Breath    Alexandra Meadows is a 25 y.o. female.   Shortness of Breath Associated symptoms: chest pain, cough, headaches and vomiting   Associated symptoms: no abdominal pain, no fever and no rash   Patient presents with shortness of breath cough and a known COVID diagnosis.  Also dull headache.  Headache began after arriving in the ER however.  Had a virtual visit and then followed up at urgent care today.  States at the urgent care they would not even listen to her lungs.  States they lied and said that she did not tell them that she had COVID until later.  Symptoms began on Thursday with today being Tuesday.  This is her fourth time having COVID.  Had a [employer] vaccine initially but has not had boosters.  History of asthma.  Has had some wheezing.  No fevers.  Has had some nausea and diarrhea also.    Past Medical History:  Diagnosis Date   Anemia    Anxiety    Asthma    prn inhaler; exercise induced   Carpal tunnel syndrome    Depression    severe at 20weeks   History of seizures    states caused by Xanax - no seizures since 2015   Infection    BV with preg; recurrent yeast inf   Migraines    Seasonal allergies    Tonsillar hypertrophy 04/2016    Patient Active Problem List   Diagnosis Date Noted   Vertigo 08/07/2020   Environmental and seasonal allergies 03/10/2020   Chronic bilateral thoracic back pain 08/25/2018   Vaginal bleeding 01/21/2018   SVD (spontaneous vaginal delivery) 12/27/2017   Uterine size date discrepancy pregnancy, third trimester    Umbilical vein abnormality affecting pregnancy    Vitamin D deficiency 07/05/2017   Supervision of high-risk pregnancy 06/30/2017   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   Anxiety and depression 02/17/2014   INSOMNIA 03/20/2008   ADHD  08/29/2007   Migraine without aura 03/29/2007    Past Surgical History:  Procedure Laterality Date   KNEE ARTHROSCOPY Left 02/28/2014   Procedure: ARTHROSCOPY LEFT KNEE WITH SYNOVECTOMY LIMITED, PARTIAL LATERAL MENISECTOMY;  Surgeon: Loreta Ave, MD;  Location: [facility];  Service: Orthopedics;  Laterality: Left;   TONSILLECTOMY AND ADENOIDECTOMY Bilateral 05/25/2016   Procedure: TONSILLECTOMY AND ADENOIDECTOMY;  Surgeon: Newman Pies, MD;  Location: [facility];  Service: ENT;  Laterality: Bilateral;   WISDOM TOOTH EXTRACTION  2015     OB History     Gravida  2   Para  2   Term  2   Preterm      AB      Living  2      SAB      IAB      Ectopic      Multiple  0   Live Births  2           Family History  Problem Relation Age of Onset   Diabetes Mother    Cancer Maternal Grandfather     Social History   Tobacco Use   Smoking status: Never   Smokeless tobacco: Never   Tobacco comments:    quit  Substance Use Topics   Alcohol use: Yes    Alcohol/week: 0.0  standard drinks    Comment: occ   Drug use: No    Home Medications Prior to Admission medications   Medication Sig Start Date End Date Taking? Authorizing Provider  butalbital-aspirin-caffeine [facility]) [phone] MG capsule Take 1 capsule by mouth every 6 (six) hours as needed for headache. 10/15/19   Nelwyn Salisbury, MD  cyclobenzaprine (FLEXERIL) 10 MG tablet TAKE ONE TABLET BY MOUTH THREE TIMES A DAY AS NEEDED FOR MUSCLE SPASMS 12/24/20   Nelwyn Salisbury, MD  dexamethasone (DECADRON) 6 MG tablet Take 1 tablet (6 mg total) by mouth daily for 5 days. 05/05/21 05/10/21  Theadora Rama Scales, PA-C  diphenhydrAMINE HCl (BENADRYL PO) Take 1 tablet by mouth as needed.    [provider]  Escitalopram Oxalate (LEXAPRO PO) Take by mouth daily.    [provider]  fluticasone (FLONASE) 50 MCG/ACT nasal spray Place 2 sprays into both nostrils daily.    [provider]  gabapentin (NEURONTIN) 300 MG capsule Take 300 mg by mouth 2 (two) times daily. 04/09/21   [provider]  Levocetirizine Dihydrochloride (XYZAL ALLERGY 24HR PO) Take 1 tablet by mouth daily.    [provider]  meloxicam (MOBIC) 15 MG tablet Take 15 mg by mouth daily as needed. 04/27/21   [provider]  [name] 3-0.02 MG tablet TAKE ONE TABLET BY MOUTH DAILY 04/20/21   Nelwyn Salisbury, MD  ondansetron (ZOFRAN ODT) 8 MG disintegrating tablet Take 1 tablet (8 mg total) by mouth every 8 (eight) hours as needed for nausea or vomiting. 10/21/20   Burchette, Elberta Fortis, MD  rizatriptan (MAXALT) 10 MG tablet Take 1 tablet (10 mg total) by mouth as needed for migraine. May repeat in 2 hours if needed 12/26/20   Nelwyn Salisbury, MD  topiramate (TOPAMAX) 25 MG tablet TAKE ONE TABLET BY MOUTH EVERY NIGHT AT BEDTIME 04/06/21   Nelwyn Salisbury, MD    Allergies    Alprazolam, Bee venom, and Tetanus toxoids  Review of Systems   Review of Systems  Constitutional:  Positive for appetite change. Negative for fever.  HENT:  Negative for congestion.   Respiratory:  Positive for cough and shortness of breath.   Cardiovascular:  Positive for chest pain.  Gastrointestinal:  Positive for diarrhea and vomiting. Negative for abdominal pain.  Genitourinary:  Negative for flank pain.  Musculoskeletal:  Positive for myalgias. Negative for back pain.  Skin:  Negative for rash.  Neurological:  Positive for headaches. Negative for weakness.   Physical Exam Updated Vital Signs BP 125/79 (BP Location: Right Arm)   Pulse (!) 44   Temp 98.3 F (36.8 C) (Oral)   Resp 18   Ht 5\' 3"  (1.6 m)   Wt 69.9 kg   LMP 04/15/2021 (Approximate)   SpO2 100%   BMI 27.28 kg/m   Physical Exam Vitals and nursing note reviewed.  HENT:     Head: Atraumatic.  Cardiovascular:     Rate and Rhythm: Normal rate and regular rhythm.  Pulmonary:     Breath sounds: No wheezing, rhonchi or rales.  Chest:      Chest wall: No tenderness.  Abdominal:     Tenderness: There is no abdominal tenderness.  Musculoskeletal:     Cervical back: Normal range of motion.     Right lower leg: No edema.     Left lower leg: No edema.  Skin:    General: Skin is warm.     Capillary Refill: Capillary refill  takes less than 2 seconds.  Neurological:     Mental Status: She is alert and oriented to person, place, and time.    ED Results / Procedures / Treatments   Labs (all labs ordered are listed, but only abnormal results are displayed) Labs Reviewed - No data to display  EKG None  Radiology DG Chest 2 View  Result Date: 05/05/2021 CLINICAL DATA:  SOB w/cough and exp wheeze EXAM: CHEST - 2 VIEW COMPARISON:  03/28/2014. FINDINGS: The heart size and mediastinal contours are within normal limits. Both lungs are clear. No visible pleural effusions or pneumothorax. No acute osseous abnormality. IMPRESSION: No evidence of acute cardiopulmonary disease. Electronically Signed   By: Feliberto Harts M.D.   On: 05/05/2021 14:21    Procedures Procedures   Medications Ordered in ED Medications  prochlorperazine (COMPAZINE) injection 10 mg (10 mg Intramuscular Given 05/05/21 1819)  ketorolac (TORADOL) 15 MG/ML injection 15 mg (15 mg Intramuscular Given 05/05/21 1816)    ED Course  I have reviewed the triage vital signs and the nursing notes.  Pertinent labs & imaging results that were available during my care of the patient were reviewed by me and considered in my medical decision making (see chart for details).    MDM Rules/Calculators/A&P                           Patient with known COVID infection.  Symptoms began on Thursday.  Today would be day 6 of symptoms.  Not a candidate for Paxil bid because of this.  X-ray did not show pneumonia.  Patient I think would benefit from steroids.  Has a history of asthma and flares up when she gets infections.  We will be able to follow-up PCP.  Also states she  developed a migraine while she was here.  Treated symptomatically.  Will discharge home.  Do not feel as if she needs further work-up for either the headache or other causes such as pulmonary embolism at this time. Final Clinical Impression(s) / ED Diagnoses Final diagnoses:  COVID-19  Nonintractable headache, unspecified chronicity pattern, unspecified headache type    Rx / DC Orders ED Discharge Orders     None        Benjiman Core, MD 05/05/21 2355

## 2021-05-05 NOTE — Telephone Encounter (Signed)
Patient called because she was referred to urgent care per Dr.Kim. Patient states that she was told by Theadora Rama Scales, PA-C that she would not listen to her lungs and PA told her  "You have covid and I'm trying to run a facility" and then opened door to let patient leave. Patient states she was not given physical examination at all, only the vitals were taken. Patient was told by PA that she would send a prescription for her asthma but wouldn't send prescription for covid because the covid had to run its course. Patient states EMS told her she has wheezing in lungs and patient wanted to see if she had pneumonia, which was not checked by PA. Patient wants to know if she can get prescription for pneumonia. Patient states she is having a hard time breathing despite the vitals being good at visit       Please Send to  [facility] [id number] - [place], [place] - [address] Phone:  [phone]  Fax:  [phone]        Good callback number is  [phone]

## 2021-05-05 NOTE — ED Triage Notes (Signed)
Pt states SOB since Saturday along with cough.  Pt received breathing tx from EMS on Monday with relief.  Today, SOB returned.

## 2021-05-05 NOTE — ED Notes (Signed)
Lights dimmed for pt's comfort.

## 2021-05-05 NOTE — Discharge Instructions (Addendum)
Take the steroids you are prescribed earlier today.  Follow-up with your doctor as needed.  Cough medicines may help with some of the symptoms.

## 2021-05-05 NOTE — ED Notes (Signed)
Pt discharged home after verbalizing understanding of discharge instructions; nad noted. 

## 2021-05-05 NOTE — Telephone Encounter (Signed)
cancel

## 2021-05-05 NOTE — Progress Notes (Signed)
Virtual Visit via Video Note  I connected with Alexandra Meadows  on 05/05/21 at 10:00 AM EDT by a video enabled telemedicine application and verified that I am speaking with the correct person using two identifiers.  Location patient: home, [place] Location provider:work or home office Persons participating in the virtual visit: patient, provider, patient's father  I discussed the limitations of evaluation and management by telemedicine and the availability of in person appointments. The patient expressed understanding and agreed to proceed.   HPI:  Acute telemedicine visit for Covid19: -daughter's school had an outbreak of covid and daughter had covid -Onset: about 5-6 days ago, had positive covid test a few days ago -Symptoms include: cough initially, then emesis, chest pain 3 days ago and then SOB the last few days, hoarseness, using her albuterol multiple times per day -EMS came yesterday but she reports they had an attitude and told her she was fine -Denies:shortness of breath even at rest, chest discomfort (tight and pain) -Pertinent past medical history: has had covid 3 times before, history of EIB, has been vaping extensively the last 5-6 months  -Pertinent medication allergies: Allergies  Allergen Reactions   Alprazolam Other (See Comments)    SEIZURES Other reaction(s): Other (See Comments) SEIZURES    Bee Venom Hives and Swelling   Tetanus Toxoids Hives  -COVID-19 vaccine status: only had the one shot of J and J -denies any chance of pregnancy  ROS: See pertinent positives and negatives per HPI.  Past Medical History:  Diagnosis Date   Anemia    Anxiety    Asthma    prn inhaler; exercise induced   Carpal tunnel syndrome    Depression    severe at 20weeks   History of seizures    states caused by Xanax - no seizures since 2015   Infection    BV with preg; recurrent yeast inf   Migraines    Seasonal allergies    Tonsillar hypertrophy 04/2016    Past Surgical History:   Procedure Laterality Date   KNEE ARTHROSCOPY Left 02/28/2014   Procedure: ARTHROSCOPY LEFT KNEE WITH SYNOVECTOMY LIMITED, PARTIAL LATERAL MENISECTOMY;  Surgeon: Loreta Ave, MD;  Location: [facility];  Service: Orthopedics;  Laterality: Left;   TONSILLECTOMY AND ADENOIDECTOMY Bilateral 05/25/2016   Procedure: TONSILLECTOMY AND ADENOIDECTOMY;  Surgeon: Newman Pies, MD;  Location: [facility];  Service: ENT;  Laterality: Bilateral;   WISDOM TOOTH EXTRACTION  2015     Current Outpatient Medications:    butalbital-aspirin-caffeine (FIORINAL) 50-325-40 MG capsule, Take 1 capsule by mouth every 6 (six) hours as needed for headache., Disp: 60 capsule, Rfl: 1   cyclobenzaprine (FLEXERIL) 10 MG tablet, TAKE ONE TABLET BY MOUTH THREE TIMES A DAY AS NEEDED FOR MUSCLE SPASMS, Disp: 60 tablet, Rfl: 3   diphenhydrAMINE HCl (BENADRYL PO), Take 1 tablet by mouth as needed., Disp: , Rfl:    Escitalopram Oxalate (LEXAPRO PO), Take by mouth daily., Disp: , Rfl:    fluticasone (FLONASE) 50 MCG/ACT nasal spray, Place 2 sprays into both nostrils daily., Disp: , Rfl:    gabapentin (NEURONTIN) 300 MG capsule, Take 300 mg by mouth 2 (two) times daily., Disp: , Rfl:    Levocetirizine Dihydrochloride (XYZAL ALLERGY 24HR PO), Take 1 tablet by mouth daily., Disp: , Rfl:    meloxicam (MOBIC) 15 MG tablet, Take 15 mg by mouth daily as needed., Disp: , Rfl:    [name] 3-0.02 MG tablet, TAKE ONE TABLET BY MOUTH DAILY, Disp: 84  tablet, Rfl: 1   ondansetron (ZOFRAN ODT) 8 MG disintegrating tablet, Take 1 tablet (8 mg total) by mouth every 8 (eight) hours as needed for nausea or vomiting., Disp: 12 tablet, Rfl: 0   rizatriptan (MAXALT) 10 MG tablet, Take 1 tablet (10 mg total) by mouth as needed for migraine. May repeat in 2 hours if needed, Disp: 10 tablet, Rfl: 3   topiramate (TOPAMAX) 25 MG tablet, TAKE ONE TABLET BY MOUTH EVERY NIGHT AT BEDTIME, Disp: 30 tablet, Rfl: 2  EXAM:  VITALS per patient  if applicable:  GENERAL: alert, oriented  HEENT: atraumatic, conjunttiva clear, no obvious abnormalities on inspection of external nose and ears  NECK: normal movements of the head and neck  LUNGS: on inspection she does seem to be short of breath with talking  CV: no obvious cyanosis  MS: moves all visible extremities without noticeable abnormality  PSYCH/NEURO: pleasant and cooperative, no obvious depression or anxiety, speech and thought processing grossly intact  ASSESSMENT AND PLAN:  Discussed the following assessment and plan:  COVID-19  Given reported symptoms of SOB and chest discomfort have advised inperson evaluation as may need eval for complications of Covid19 infection (particularly given she has had multiple covid infections and is not fully vaccinated). Advised prompt eval this morning at higher level of care [facility] or [facility]. She agrees to go, plans to go via private vehicle and father can assist.   I discussed the assessment and treatment plan with the patient. The patient was provided an opportunity to ask questions and all were answered. The patient agreed with the plan and demonstrated an understanding of the instructions.     Terressa Koyanagi, DO

## 2021-05-05 NOTE — ED Notes (Signed)
Pt via pov from home with covid symptoms and sob x 3 days. Pt states she had virtual visit today and was sent to UC, where she states the provider would not examine her because she thought she had not admitted that she had covid. Pt knew she had steroids prescribed, but wanted a full exam. Pt alert & oriented, nad noted.

## 2021-05-05 NOTE — Patient Instructions (Signed)
Seek prompt inperson medical evaluation at the Urgent Care or the [facility] right away this morning. Have someone drive you. If you are having worsening or life threatening symptoms please call 911.     I hope you are feeling better soon!  It was nice to meet you today. I help [facility] out with telemedicine visits on Tuesdays and Thursdays and am available for visits on those days. If you have any concerns or questions following this visit please schedule a follow up visit with your Primary Care doctor or seek care at a local urgent care clinic to avoid delays in care.

## 2021-05-05 NOTE — ED Triage Notes (Signed)
Pt reports Saturday she began vomiting, she tested positive for Covid, coughing, began having difficulty breathing. Patient states her chest feels tight (she reports having asthma).  Home interventions: albuterol inhaler (2 Puff, last this morning)

## 2021-05-08 ENCOUNTER — Other Ambulatory Visit: Payer: Self-pay | Admitting: Family Medicine

## 2021-06-09 ENCOUNTER — Telehealth: Payer: Self-pay | Admitting: Family Medicine

## 2021-06-09 NOTE — Telephone Encounter (Signed)
Patient called to  follow up on not getting a refill for escitalopram (LEXAPRO) 10 MG tablet   As per Harriett Sine, refill was not needed as she was supposed to be on the Wellbutrin, but patient states the wellbutrin was never sent in and she had been on Lexapro this whole time.    Patient states that she would rather stay on the Lexapro versus starting a new medication    Please send to  [facility] [id number] - [place], [place] - [address] Phone:  [phone]  Fax:  [phone]       Please advise

## 2021-06-09 NOTE — Telephone Encounter (Signed)
Please advise 

## 2021-06-10 MED ORDER — ESCITALOPRAM OXALATE 10 MG PO TABS
10.0000 mg | ORAL_TABLET | Freq: Every day | ORAL | 3 refills | Status: DC
Start: 2021-06-10 — End: 2022-02-02

## 2021-06-10 NOTE — Telephone Encounter (Signed)
Done

## 2021-06-10 NOTE — Addendum Note (Signed)
Addended by: Gershon Crane A on: 06/10/2021 11:49 AM   Modules accepted: Orders

## 2021-06-11 NOTE — Telephone Encounter (Signed)
Message complete

## 2021-06-12 ENCOUNTER — Telehealth: Payer: Self-pay

## 2021-06-12 NOTE — Telephone Encounter (Signed)
Caller was transferred from office when no appts available. Caller has extreme back pain. Has started PT, chiopractic. Mentions high BP and high heartrate, goes up and down. Her legs and hands extremely cold, cant get warm, bundled up. Mentions carpal tunnel surgery and recent tramadol shot. Pain mostly lower back to mid back, shooting pain down legs with tingling numbness -- mostly on right side but both legs. Feet also numb, cold. Took oxycodone 10mg /taking half was from September surgery, gabapentin,IBU, flexeril taking 4 a day for pain. Date of injury 06/02/21 telehealth after work injury. Injury in the past.  06/12/2021 12:10:00 PM Go to ED Now (or PCP triage) 06/14/2021, RN, Gasper Sells Complies Caller Understands Yes  Referrals [facility] - ED  06/12/21 1625: No noted ED visit at this time. LVM informing pt that this RN was calling to check on her status & recommended disposition from previous call.

## 2021-07-04 ENCOUNTER — Other Ambulatory Visit: Payer: Self-pay | Admitting: Family Medicine

## 2021-08-11 ENCOUNTER — Ambulatory Visit (INDEPENDENT_AMBULATORY_CARE_PROVIDER_SITE_OTHER): Payer: Self-pay

## 2021-08-11 ENCOUNTER — Ambulatory Visit (INDEPENDENT_AMBULATORY_CARE_PROVIDER_SITE_OTHER): Payer: Worker's Compensation | Admitting: Orthopaedic Surgery

## 2021-08-11 ENCOUNTER — Other Ambulatory Visit: Payer: Self-pay

## 2021-08-11 VITALS — BP 135/79 | HR 114

## 2021-08-11 DIAGNOSIS — M5441 Lumbago with sciatica, right side: Secondary | ICD-10-CM

## 2021-08-11 DIAGNOSIS — M5442 Lumbago with sciatica, left side: Secondary | ICD-10-CM | POA: Diagnosis not present

## 2021-08-11 DIAGNOSIS — G8929 Other chronic pain: Secondary | ICD-10-CM | POA: Diagnosis not present

## 2021-08-11 NOTE — Progress Notes (Signed)
Office Visit Note   Patient: Alexandra Meadows           Date of Birth: 1996/06/25           MRN: [id number] Visit Date: 08/11/2021              Requested by: Nelwyn Salisbury, MD [address] [place],  [place] [address] PCP: Nelwyn Salisbury, MD   Assessment & Plan: Visit Diagnoses:  1. Chronic bilateral low back pain with bilateral sciatica     Plan: Patient's been out of work for an injury that occurred more than 2 months ago.  Work note given no work pending MRI review.  We will obtain an MRI scan lumbar spine.  She is neurologically intact on exam.  Office follow-up after MRI scan.  Follow-Up Instructions: No follow-ups on file.   Orders:  Orders Placed This Encounter  Procedures   XR Lumbar Spine 2-3 Views   MR Lumbar Spine w/o contrast   No orders of the defined types were placed in this encounter.     Procedures: No procedures performed   Clinical Data: No additional findings.   Subjective: Chief Complaint  Patient presents with   Lower Back - Pain    HPI 26 year old new patient visit concerning Worker's Comp. injury that occurred over 2 months ago on 06/02/2021.  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  She has had an amputation and the patient was being transferred she was just assisting and then she states The patient became "dead weight".  She had immediate onset of back pain she was treated by chiropractor for 6-7 visits on her own and had physical therapy 8-9 visit.  She is treated with anti-inflammatories.  She is gotten some relief with Neurontin.  She also had meloxicam and also had some Percocet.  She had recent carpal tunnel release 2 weeks ago by Dr. Eulah Pont and had already had the opposite hand carpal tunnel release in the distant past.  She is currently out of work.  She has had plain radiographs which were negative.  She has 2 children age 29 and age 21 tries to limit the lifting of her children.   She works as a traveling Lawyer.  With her knees about 25 [facility].  Included her plain radiographs which were negative.  Outlined conservative treatment.  Patient additionally had problems with migraines.  Also anxiety and depression.  She states she handles pain well with to natural childbirth multiple tattoos, piercings.  She states she had some scoliosis with some associated thoracic pain at times.  Review of Systems no associated bowel bladder symptoms no fever or chills.   Objective: Vital Signs: BP 135/79    Pulse (!) 114   Physical Exam Constitutional:      Appearance: She is well-developed.  HENT:     Head: Normocephalic.     Right Ear: External ear normal.     Left Ear: External ear normal. There is no impacted cerumen.  Eyes:     Pupils: Pupils are equal, round, and reactive to light.  Neck:     Thyroid: No thyromegaly.     Trachea: No tracheal deviation.  Cardiovascular:     Rate and Rhythm: Normal rate.  Pulmonary:     Effort: Pulmonary effort is normal.  Abdominal:     Palpations: Abdomen is soft.  Musculoskeletal:     Cervical back: No rigidity.  Skin:    General: Skin is warm and dry.  Neurological:     Mental Status: She is alert and oriented to person, place, and time.  Psychiatric:        Behavior: Behavior normal.    Ortho Exam patient is able to heel and toe walk.  Anterior tib is strong bilaterally.  She needs encouragement for EHL firing more on the left than right but afterwards with range of motion exercises she is doing on her ankle on her own she fires her EHL easily and repetitively cognizant.  She is able heel and toe walk complains of back pain.  Some tenderness palpation over the sacroiliac joint right and left no sciatic notch tenderness no trochanteric bursal tenderness negative logroll the hips.  Knee and ankle jerk are 2+ and symmetrical.  Good quad strength.  Negative FABER  test.  Specialty Comments:  No specialty comments available.  Imaging: No results found.   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   Anxiety and depression 02/17/2014   INSOMNIA 03/20/2008   ADHD 08/29/2007   Migraine without aura 03/29/2007   Past Medical History:  Diagnosis Date   Anemia    Anxiety    Asthma    prn inhaler; exercise induced   Carpal tunnel syndrome    Depression    severe at 20weeks   History of seizures    states caused by Xanax - no seizures since 2015   Infection    BV with preg; recurrent yeast inf   Migraines    Seasonal allergies    Tonsillar hypertrophy 04/2016    Family History  Problem Relation Age of Onset   Diabetes Mother    Cancer Maternal Grandfather     Past Surgical History:  Procedure Laterality Date   KNEE ARTHROSCOPY Left 02/28/2014   Procedure: ARTHROSCOPY LEFT KNEE WITH SYNOVECTOMY LIMITED, PARTIAL LATERAL MENISECTOMY;  Surgeon: Loreta Ave, MD;  Location: [facility];  Service: Orthopedics;  Laterality: Left;   TONSILLECTOMY AND ADENOIDECTOMY Bilateral 05/25/2016   Procedure: TONSILLECTOMY AND ADENOIDECTOMY;  Surgeon: Newman Pies, MD;  Location: [facility];  Service: ENT;  Laterality: Bilateral;   WISDOM TOOTH EXTRACTION  2015   Social History   Occupational History   Not on file  Tobacco Use   Smoking status: Never   Smokeless tobacco: Never   Tobacco comments:    quit  Substance and Sexual Activity   Alcohol use: Yes    Alcohol/week: 0.0  standard drinks    Comment: occ   Drug use: No   Sexual activity: Yes    Partners: Male    Birth control/protection: None

## 2021-08-28 ENCOUNTER — Telehealth: Payer: Self-pay | Admitting: Orthopaedic Surgery

## 2021-08-28 ENCOUNTER — Other Ambulatory Visit: Payer: Self-pay

## 2021-08-28 ENCOUNTER — Ambulatory Visit
Admission: RE | Admit: 2021-08-28 | Discharge: 2021-08-28 | Disposition: A | Discharge status: Home / Self Care | Payer: Self-pay | Source: Ambulatory Visit | Attending: Orthopaedic Surgery | Admitting: Orthopaedic Surgery

## 2021-08-28 DIAGNOSIS — G8929 Other chronic pain: Secondary | ICD-10-CM

## 2021-08-28 NOTE — Telephone Encounter (Signed)
Called and advised. Pt she would like a cb once results are received

## 2021-08-28 NOTE — Telephone Encounter (Signed)
Patient called asked if she can get a call concerning the results of her MRI?  Patient said she is in so much pain. The number to contact patient is [phone]

## 2021-08-28 NOTE — Telephone Encounter (Signed)
Holding for betsy for next week

## 2021-08-31 ENCOUNTER — Telehealth: Payer: Self-pay | Admitting: Orthopedic Surgery

## 2021-08-31 NOTE — Telephone Encounter (Signed)
Alexandra Meadows called in to let us know that she received her MRI results this weekend and she needs to speak with Dr. Ophelia Charter ASAP.  She read that she had several disc bulges and a tumor. States that she is very anxious and does not want to wait for her follow up appointment to discuss, she would like a phone call.  She can be reached at [phone].

## 2021-08-31 NOTE — Telephone Encounter (Signed)
See other message

## 2021-08-31 NOTE — Telephone Encounter (Signed)
Note has been forwarded to Dr Ophelia Charter to advise. Pending response from Dr Ophelia Charter

## 2021-09-11 ENCOUNTER — Other Ambulatory Visit: Payer: Self-pay

## 2021-09-11 ENCOUNTER — Other Ambulatory Visit (HOSPITAL_BASED_OUTPATIENT_CLINIC_OR_DEPARTMENT_OTHER): Payer: Self-pay

## 2021-09-11 ENCOUNTER — Encounter (HOSPITAL_BASED_OUTPATIENT_CLINIC_OR_DEPARTMENT_OTHER): Payer: Self-pay | Admitting: *Deleted

## 2021-09-11 ENCOUNTER — Emergency Department (HOSPITAL_BASED_OUTPATIENT_CLINIC_OR_DEPARTMENT_OTHER)
Admission: EM | Admit: 2021-09-11 | Discharge: 2021-09-11 | Disposition: A | Discharge status: Home / Self Care | Payer: Worker's Compensation | Attending: Emergency Medicine | Admitting: Emergency Medicine

## 2021-09-11 DIAGNOSIS — Y990 Civilian activity done for income or pay: Secondary | ICD-10-CM | POA: Diagnosis not present

## 2021-09-11 DIAGNOSIS — G8929 Other chronic pain: Secondary | ICD-10-CM

## 2021-09-11 DIAGNOSIS — W19XXXA Unspecified fall, initial encounter: Secondary | ICD-10-CM | POA: Diagnosis not present

## 2021-09-11 DIAGNOSIS — M5442 Lumbago with sciatica, left side: Secondary | ICD-10-CM | POA: Insufficient documentation

## 2021-09-11 DIAGNOSIS — M5441 Lumbago with sciatica, right side: Secondary | ICD-10-CM | POA: Diagnosis not present

## 2021-09-11 DIAGNOSIS — M545 Low back pain, unspecified: Secondary | ICD-10-CM | POA: Diagnosis present

## 2021-09-11 MED ORDER — OXYCODONE-ACETAMINOPHEN 5-325 MG PO TABS
1.0000 | ORAL_TABLET | Freq: Four times a day (QID) | ORAL | 0 refills | Status: DC | PRN
  Filled 2021-09-11: qty 12, 3d supply, fill #0

## 2021-09-11 MED ORDER — PREDNISONE 20 MG PO TABS
40.0000 mg | ORAL_TABLET | Freq: Every day | ORAL | 0 refills | Status: AC
  Filled 2021-09-11: qty 10, 5d supply, fill #0

## 2021-09-11 MED ORDER — OXYCODONE-ACETAMINOPHEN 5-325 MG PO TABS
2.0000 | ORAL_TABLET | Freq: Once | ORAL | Status: AC
  Administered 2021-09-11: 2 via ORAL
  Filled 2021-09-11: qty 2

## 2021-09-11 MED ORDER — KETOROLAC TROMETHAMINE 15 MG/ML IJ SOLN
15.0000 mg | Freq: Once | INTRAMUSCULAR | Status: AC
  Administered 2021-09-11: 15 mg via INTRAVENOUS
  Filled 2021-09-11: qty 1

## 2021-09-11 MED ORDER — LIDOCAINE 5 % EX PTCH
1.0000 | MEDICATED_PATCH | CUTANEOUS | Status: DC
  Administered 2021-09-11: 1 via TRANSDERMAL
  Filled 2021-09-11: qty 1

## 2021-09-11 MED ORDER — LIDOCAINE 5 % EX PTCH
1.0000 | MEDICATED_PATCH | CUTANEOUS | 0 refills | Status: DC
  Filled 2021-09-11: qty 14, 14d supply, fill #0

## 2021-09-11 NOTE — ED Triage Notes (Signed)
Pt is here for evaluation of worsening chronic back pain.  No recent event to trigger anything.  Pt had a recent MRI

## 2021-09-11 NOTE — ED Notes (Signed)
EMT-P provided AVS using Teachback Method. Patient verbalizes understanding of Discharge Instructions. Opportunity for Questioning and Answers were provided by EMT-P. Patient Discharged from ED.  ? ?

## 2021-09-11 NOTE — ED Provider Notes (Signed)
[facility] EMERGENCY DEPT Provider Note   CSN: [id number] Arrival date & time: 09/11/21  1041     History  Chief Complaint  Patient presents with   Back Pain    Alexandra Meadows is a 26 y.o. female.  26 year old female presents with her mom for evaluation of low back pain of 51-month duration.  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  She has been out of work since and has been following up with orthopedics and recently had her MRI done on 2/3.  She has not followed up with orthopedics since but has spoken to them on the phone.  She has been taken ibuprofen, applying warm/cold compress.  She states she had leftover Percocet from previous surgery but she has 1 tablet left of that she has been taking with some pain relief.  She is also on chronic Neurontin which is helping with her symptoms.  She states has been staying at home and has not been able to function at any significant level since the injury.  She denies any new symptoms since she was evaluated with orthopedist or since she had an MRI done.  She denies fever, chills, unintentional weight loss, history of IV drug use, loss of bowel or bladder control.  She does endorse paresthesias in bilateral lower extremities.  Denies difficulty ambulating, however endorses pain with ambulating.  The history is provided by the patient. No language interpreter was used.      Home Medications Prior to Admission medications   Medication Sig Start Date End Date Taking? Authorizing Provider  butalbital-aspirin-caffeine [facility]) [phone] MG capsule Take 1 capsule by mouth every 6 (six) hours as needed for headache. 10/15/19   Laurey Morale, MD  cyclobenzaprine (FLEXERIL) 10 MG tablet TAKE ONE TABLET BY MOUTH THREE TIMES A DAY AS NEEDED FOR MUSCLE SPASMS 07/06/21   Laurey Morale, MD  diphenhydrAMINE HCl (BENADRYL PO) Take 1 tablet by mouth as needed.    [provider]  escitalopram (LEXAPRO) 10 MG tablet Take 1 tablet (10 mg total) by mouth daily. 06/10/21   Laurey Morale, MD  fluticasone (FLONASE) 50 MCG/ACT nasal spray Place 2 sprays into both nostrils daily.    [provider]  gabapentin (NEURONTIN) 300 MG capsule Take 300 mg by mouth 2 (two) times daily. 04/09/21   [provider]  Levocetirizine Dihydrochloride (XYZAL ALLERGY 24HR PO) Take 1 tablet by mouth daily.    [provider]  meloxicam (MOBIC) 15 MG tablet Take 15 mg by mouth daily as needed. 04/27/21   [provider]  [name] 3-0.02 MG tablet TAKE ONE TABLET BY MOUTH DAILY 04/20/21   Laurey Morale, MD  ondansetron (ZOFRAN ODT) 8 MG disintegrating tablet Take 1 tablet (8 mg total) by mouth every 8 (eight) hours as needed for nausea or vomiting. 10/21/20   Burchette, Alinda Sierras, MD  rizatriptan (MAXALT) 10 MG tablet Take 1 tablet (10 mg total) by mouth as needed for migraine. May repeat in 2 hours if needed 12/26/20   Laurey Morale, MD  topiramate (TOPAMAX) 25 MG tablet TAKE ONE TABLET BY MOUTH EVERY NIGHT AT BEDTIME 04/06/21   Laurey Morale, MD      Allergies    Alprazolam, Bee venom, and Tetanus toxoids    Review of Systems   Review of Systems  Constitutional:  Positive for activity change. Negative for chills, fever and unexpected  weight change.  Gastrointestinal:  Negative for abdominal pain, nausea and vomiting.  Musculoskeletal:  Positive for back pain. Negative for gait problem and joint swelling.  Neurological:  Positive for numbness (Bilateral lower extremities paresthesias). Negative for weakness, light-headedness and headaches.  All other systems reviewed and are negative.  Physical Exam Updated Vital Signs BP 120/82 (BP Location: Left Arm)    Pulse 86    Temp 98.7 F (37.1 C)    Resp 18    Wt 68 kg    SpO2 97%    BMI 26.57 kg/m  Physical Exam Vitals and nursing note reviewed.  Constitutional:      General: She is not in acute  distress.    Appearance: Normal appearance. She is not ill-appearing.  HENT:     Head: Normocephalic and atraumatic.     Nose: Nose normal.  Eyes:     General: No scleral icterus.    Extraocular Movements: Extraocular movements intact.     Conjunctiva/sclera: Conjunctivae normal.  Cardiovascular:     Rate and Rhythm: Normal rate and regular rhythm.     Pulses: Normal pulses.     Heart sounds: Normal heart sounds.  Pulmonary:     Effort: Pulmonary effort is normal. No respiratory distress.     Breath sounds: Normal breath sounds. No wheezing or rales.  Abdominal:     General: There is no distension.     Tenderness: There is no abdominal tenderness.  Musculoskeletal:        General: Normal range of motion.     Cervical back: Normal range of motion.     Comments: Cervical spine, thoracic spine without tenderness to palpation or step-offs.  Lumbar spine with mild spinous process tenderness.  Paraspinal muscle tenderness present worse on left side.  Straight leg raise test positive bilaterally.  Full range of motion in bilateral lower extremities.  Strength intact.  Neurovascularly intact.   Skin:    General: Skin is warm and dry.  Neurological:     General: No focal deficit present.     Mental Status: She is alert. Mental status is at baseline.    ED Results / Procedures / Treatments   Labs (all labs ordered are listed, but only abnormal results are displayed) Labs Reviewed - No data to display  EKG None  Radiology No results found.  Procedures Procedures    Medications Ordered in ED Medications  ketorolac (TORADOL) 15 MG/ML injection 15 mg (has no administration in time range)  oxyCODONE-acetaminophen (PERCOCET/ROXICET) 5-325 MG per tablet 2 tablet (has no administration in time range)  lidocaine (LIDODERM) 5 % 1 patch (has no administration in time range)    ED Course/ Medical Decision Making/ A&P                           Medical Decision  Making Risk Prescription drug management.   Medical Decision Making / ED Course   This patient presents to the ED for concern of back pain, this involves an extensive number of treatment options, and is a complaint that carries with it a high risk of complications and morbidity.  The differential diagnosis includes cauda equina, spinal epidural abscess, lumbar strain, pyelonephritis  MDM: 26 year old female with history of traumatic back injury about 4 months ago presents today for evaluation of persistent back pain.  Patient follows with Dr. Lorin Mercy with orthopedics and is due for follow-up appointment 2/21.  She recently had MRI done which  did not show signs of cauda equina, or spinal epidural abscess.  It did show pedicle edema concerning for stress reaction without evidence of fracture.  Hemangioma, and bulging disc.  Patient denies worsening symptoms since her Ortho visit or since she had the MRI done.  Exam is reassuring.  History is reassuring and without concern for cauda equina, or spinal epidural abscess.  Given MRI findings will provide with Toradol, Percocet, lidocaine patch in the emergency room.  Will prescribe few doses of Percocet and 5-day course of prednisone.  Return precautions discussed.  Discussed importance of follow-up with orthopedics.  Patient voices understanding and is in agreement with plan.   Additional history obtained: -Additional history obtained from orthopedist visit on 1/17 confirming patient's story as well as MRI scheduled.  Patient neurovascularly intact during that visit. -External records from outside source obtained and reviewed including: Chart review including previous notes, labs, imaging, consultation notes   Lab Tests: -I ordered, reviewed, and interpreted labs.   The pertinent results include:   Labs Reviewed - No data to display    EKG  EKG Interpretation  Date/Time:    Ventricular Rate:    PR Interval:    QRS Duration:   QT Interval:     QTC Calculation:   R Axis:     Text Interpretation:          Medicines ordered and prescription drug management: Meds ordered this encounter  Medications   ketorolac (TORADOL) 15 MG/ML injection 15 mg   oxyCODONE-acetaminophen (PERCOCET/ROXICET) 5-325 MG per tablet 2 tablet   lidocaine (LIDODERM) 5 % 1 patch    -I have reviewed the patients home medicines and have made adjustments as needed  Reevaluation: After the interventions noted above, I reevaluated the patient and found that they have :improved Patient following Toradol, Percocet, lidocaine patch in the emergency room reports significant improvement. Co morbidities that complicate the patient evaluation  Past Medical History:  Diagnosis Date   Anemia    Anxiety    Asthma    prn inhaler; exercise induced   Carpal tunnel syndrome    Depression    severe at 20weeks   History of seizures    states caused by Xanax - no seizures since 2015   Infection    BV with preg; recurrent yeast inf   Migraines    Seasonal allergies    Tonsillar hypertrophy 04/2016      Dispostion: Patient is appropriate for discharge.  Discharged in stable condition.  Return precautions discussed.   Final Clinical Impression(s) / ED Diagnoses Final diagnoses:  Chronic bilateral low back pain with bilateral sciatica    Rx / DC Orders ED Discharge Orders          Ordered    predniSONE (DELTASONE) 20 MG tablet  Daily with breakfast        09/11/21 1310    oxyCODONE-acetaminophen (PERCOCET/ROXICET) 5-325 MG tablet  Every 6 hours PRN        09/11/21 1310    lidocaine (LIDODERM) 5 %  Every 24 hours        09/11/21 [place], [place], PA-C 09/11/21 1321    Wynona Dove A, DO 09/11/21 1552

## 2021-09-11 NOTE — Discharge Instructions (Addendum)
Your evaluated in the emergency room for back pain.  Your exam today was reassuring against a serious cause of back pain.  I did review your MRI which showed multilevel bulging disc which could be contributing to your pain along with the shooting pain you are having down both of your legs.  It also showed stress reaction on your MRI which you are working with your orthopedist to get a grant where that may be coming from.  You received a couple medication in the emergency room.  I have sent in prednisone, Percocet to the pharmacy for you.  If you have worsening symptoms including weakness to the point you are unable to walk, difficulty controlling your bowel or bladder, fever please return to the emergency room for evaluation.  Otherwise keep your scheduled appointment with the orthopedist for 2/21.

## 2021-09-15 ENCOUNTER — Ambulatory Visit (INDEPENDENT_AMBULATORY_CARE_PROVIDER_SITE_OTHER): Payer: Worker's Compensation | Admitting: Orthopaedic Surgery

## 2021-09-15 ENCOUNTER — Other Ambulatory Visit: Payer: Self-pay

## 2021-09-15 ENCOUNTER — Encounter: Payer: Self-pay | Admitting: Orthopaedic Surgery

## 2021-09-15 DIAGNOSIS — M545 Low back pain, unspecified: Secondary | ICD-10-CM | POA: Diagnosis not present

## 2021-09-15 DIAGNOSIS — G8929 Other chronic pain: Secondary | ICD-10-CM

## 2021-09-15 NOTE — Progress Notes (Signed)
Office Visit Note   Patient: Alexandra Meadows           Date of Birth: 22-Jan-1996           MRN: [id number] Visit Date: 09/15/2021              Requested by: Laurey Morale, MD [place],  [place] [address] PCP: Laurey Morale, MD   Assessment & Plan: Visit Diagnoses:  1. Chronic bilateral low back pain without sciatica     Plan: We reviewed MRI gave her copy of the report.  She has some mild edema in the pedicle on the right also left L4 and left L3.  No previous MRI for comparison.  Plain radiographs did not show any changes in these areas.  Patient has some slight bulging at L3-4 L4-5 and L5-S1 without areas of central or foraminal compression.  We discussed at this point I would recommend she alter some the lifting activities she does with her young children.  Toddlers can crawl up in her lap resident having to lift them.  Still has to lift her children as a single parent when she puts them in the car seats.  We discussed all the contributing factors to increased stress on her life currently.  No indications for operative intervention currently.  Would recommend a walking program and work avoiding repetitive lifting activities.  Follow-Up Instructions: Return in about 4 weeks (around 10/13/2021).   Orders:  No orders of the defined types were placed in this encounter.  No orders of the defined types were placed in this encounter.     Procedures: No procedures performed   Clinical Data: No additional findings.   Subjective: Chief Complaint  Patient presents with   Lower Back - Pain    HPI 26 year old female post MRI scan lumbar.  Patient states she has had go to emergency room due to her pain.  She is given oxycodone had a steroid shot and also oral steroids.  Pain radiates from her back down her left leg more than right leg.  She has gone to the chiropractor and states the chiropractor did not want to treat her since she had an MRI scan.  Acupuncture  only was performed.  Patient states the pain limits her activities.  She did get some relief from physical therapy.  She has 2 young children she states sometimes she lays in bed and cries due to the pain.  She was in school also in addition to working and taking care of her children.  Patient has been out of work due to an injury of her back now extended to about 3 months.  She had worked as a traveling Quarry manager.  On her job she had to do a fair amount of patient lifting.  She has had problems with migraines anxiety and depression.  Review of Systems all the systems update unchanged from 08/11/2021 office visit.   Objective: Vital Signs: BP 99/66    Pulse 98    Ht 5\' 3"  (1.6 m)    Wt 150 lb (68 kg)    BMI 26.57 kg/m   Physical Exam Constitutional:      Appearance: She is well-developed.  HENT:     Head: Normocephalic.     Right Ear: External ear normal.     Left Ear: External ear normal. There is no impacted cerumen.  Eyes:     Pupils: Pupils are equal, round, and reactive to light.  Neck:  Thyroid: No thyromegaly.     Trachea: No tracheal deviation.  Cardiovascular:     Rate and Rhythm: Normal rate.  Pulmonary:     Effort: Pulmonary effort is normal.  Abdominal:     Palpations: Abdomen is soft.  Musculoskeletal:     Cervical back: No rigidity.  Skin:    General: Skin is warm and dry.  Neurological:     Mental Status: She is alert and oriented to person, place, and time.  Psychiatric:        Behavior: Behavior normal.    Ortho Exam patient is able to heel and toe walk she has intact reflexes she withdraws with palpation of the lumbar spine.  Negative lumbar pinch test.  Negative Faber.  Normal logroll.  EHL quad hamstrings hip flexors are intact.  Specialty Comments:  No specialty comments available.  Imaging: Narrative & Impression  CLINICAL DATA:  Chronic bilateral low back pain with bilateral sciatica. Lumbar radiculopathy, symptoms persist with greater than 6 weeks  treatment; spinal stenosis, lumbar. Additional history provided by scanning technologist: Patient reports low back pain radiating to bilateral lower extremities, leg tingling, symptoms following lifting injury at work.   EXAM: MRI LUMBAR SPINE WITHOUT CONTRAST   TECHNIQUE: Multiplanar, multisequence MR imaging of the lumbar spine was performed. No intravenous contrast was administered.   COMPARISON:  Radiographs of the lumbar spine 08/11/2021.   FINDINGS: Segmentation: In correlating with the prior lumbar spine radiographs of 08/11/2021, there are 5 lumbar vertebrae and the caudal most well-formed intervertebral disc space is designated L5-S1.   Alignment:  No significant spondylolisthesis.   Vertebrae: Vertebral body height is maintained. Edema is present within the right L5 pedicle, left L4 pedicle and b left L3 pedicle. Hemangioma within the S2 sacral body.   Conus medullaris and cauda equina: Conus extends to the L1-L2 level. No signal abnormality identified within the visualized distal spinal cord.   Paraspinal and other soft tissues: No abnormality identified within included portions of the abdomen/retroperitoneum. Paraspinal soft tissues unremarkable.   Disc levels:   Intervertebral disc height and hydration are preserved throughout the lumbar spine.   T12-L1: No significant disc herniation or stenosis.   L1-L2: No significant disc herniation or stenosis.   L2-L3: No significant disc herniation or stenosis.   L3-L4: Slight disc bulge. No significant spinal canal or foraminal stenosis.   L4-L5: Slight disc bulge. No significant spinal canal or foraminal stenosis.   L5-S1: N slight disc bulge. No significant spinal canal or foraminal stenosis.   IMPRESSION: Mild edema within the right L5 pedicle, left L4 pedicle and left L3 pedicle. Findings are suspicious for stress reaction.   Slight disc bulges at L3-L4, L4-L5 and L5-S1. No significant spinal canal or  foraminal stenosis.     Electronically Signed   By: Jackey Loge D.O.   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   Anxiety and depression 02/17/2014   INSOMNIA 03/20/2008   ADHD 08/29/2007   Migraine without aura 03/29/2007  Past Medical History:  Diagnosis Date   Anemia    Anxiety    Asthma    prn inhaler; exercise induced   Carpal tunnel syndrome    Depression    severe at 20weeks   History of seizures    states caused by Xanax - no seizures since 2015   Infection    BV with preg; recurrent yeast inf   Migraines    Seasonal allergies    Tonsillar hypertrophy 04/2016    Family History  Problem Relation Age of Onset   Diabetes Mother    Cancer Maternal Grandfather     Past Surgical History:  Procedure Laterality Date   KNEE ARTHROSCOPY Left 02/28/2014   Procedure: ARTHROSCOPY LEFT KNEE WITH SYNOVECTOMY LIMITED, PARTIAL LATERAL MENISECTOMY;  Surgeon: Ninetta Lights, MD;  Location: [facility];  Service: Orthopedics;  Laterality: Left;   TONSILLECTOMY AND ADENOIDECTOMY Bilateral 05/25/2016   Procedure: TONSILLECTOMY AND ADENOIDECTOMY;  Surgeon: Leta Baptist, MD;  Location: [facility];  Service: ENT;  Laterality: Bilateral;   [place] EXTRACTION  2015   Social History   Occupational History   Not on file  Tobacco Use   Smoking status: Never   Smokeless tobacco: Never   Tobacco comments:    quit  Substance and Sexual Activity   Alcohol  use: Yes    Alcohol/week: 0.0 standard drinks    Comment: occ   Drug use: No   Sexual activity: Yes    Partners: Male    Birth control/protection: None

## 2021-09-21 DIAGNOSIS — M545 Low back pain, unspecified: Secondary | ICD-10-CM | POA: Insufficient documentation

## 2021-09-28 ENCOUNTER — Telehealth: Payer: Self-pay | Admitting: Radiology

## 2021-09-28 ENCOUNTER — Telehealth: Payer: Self-pay | Admitting: Orthopaedic Surgery

## 2021-09-28 MED ORDER — ACETAMINOPHEN-CODEINE #3 300-30 MG PO TABS: 1.0000 | ORAL_TABLET | Freq: Two times a day (BID) | ORAL | 0 refills | Status: DC | PRN

## 2021-09-28 NOTE — Telephone Encounter (Signed)
Please advise 

## 2021-09-28 NOTE — Telephone Encounter (Signed)
Rx called to pharmacy. Voicemail left for patient advising. ?

## 2021-09-28 NOTE — Telephone Encounter (Signed)
Please see below and advise. Work comp is requesting work note. ? ?Zawatski, Fritz Pickerel, RT ?Adelma Bowdoin  ? ?Needing a work note for when Ms. Neels can return to work please.  ? ?Hortencia Pilar [email or web address]  ?Twanna Hy  ?*Caution - External email - see footer for warnings*  ? ?Good Morning Tisha,  ? ?Thank you but I have the office visit note already.  ? ? ?What I need is the Return To Work Note for 02.21.2023  ? ?    ? ? ?Hortencia Pilar RN, BSN, CCM\  ?Telephonic Case Manager  ?

## 2021-09-28 NOTE — Telephone Encounter (Signed)
Patient called needing Rx refilled Oxycodone. The number to contact patient is [phone] ?

## 2021-09-28 NOTE — Telephone Encounter (Signed)
Note has been entered per Dr. Ophelia Charter recommendation. I left voicemail for patient advising about note as I am not sure if she is aware he is sending her back. ?

## 2021-10-05 ENCOUNTER — Telehealth: Payer: Self-pay

## 2021-10-05 NOTE — Telephone Encounter (Signed)
Patients work comp called in stating that they need a work status note sent to the fax below, please advise  ? ?Fax # [phone] ?

## 2021-10-05 NOTE — Telephone Encounter (Signed)
Faxed to provided number  

## 2021-10-09 ENCOUNTER — Encounter: Payer: Self-pay | Admitting: Adult Health

## 2021-10-09 ENCOUNTER — Ambulatory Visit (INDEPENDENT_AMBULATORY_CARE_PROVIDER_SITE_OTHER): Payer: Medicaid Other | Admitting: Adult Health

## 2021-10-09 ENCOUNTER — Other Ambulatory Visit (HOSPITAL_COMMUNITY)
Admission: RE | Admit: 2021-10-09 | Discharge: 2021-10-09 | Disposition: A | Discharge status: Home / Self Care | Payer: Medicaid Other | Source: Ambulatory Visit | Attending: Adult Health | Admitting: Adult Health

## 2021-10-09 VITALS — BP 102/60 | HR 107 | Temp 98.0°F | Wt 150.0 lb

## 2021-10-09 DIAGNOSIS — Z113 Encounter for screening for infections with a predominantly sexual mode of transmission: Secondary | ICD-10-CM | POA: Insufficient documentation

## 2021-10-09 DIAGNOSIS — N760 Acute vaginitis: Secondary | ICD-10-CM

## 2021-10-09 DIAGNOSIS — B9689 Other specified bacterial agents as the cause of diseases classified elsewhere: Secondary | ICD-10-CM | POA: Insufficient documentation

## 2021-10-09 MED ORDER — METRONIDAZOLE 500 MG PO TABS: 500.0000 mg | ORAL_TABLET | Freq: Three times a day (TID) | ORAL | 0 refills | Status: DC

## 2021-10-09 NOTE — Progress Notes (Signed)
? ?Subjective:  ? ? Patient ID: Alexandra Meadows, female    DOB: 06/30/1996, 26 y.o.   MRN: [id number] ? ?HPI ? ?26 year old female who  has a past medical history of Anemia, Anxiety, Asthma, Carpal tunnel syndrome, Depression, History of seizures, Infection, Migraines, Seasonal allergies, and Tonsillar hypertrophy (04/2016). ? ?She presents to the office today for concern of " BV" She reports that her symptoms started about 5 days ago. Symptoms include that of white discharge that is odorous and smells like "fish"  ? ?She does report having unprotected sex prior to her symptoms starting.  Is not concerned for an STD but would like to be checked today. ? ?Denies fevers, chills, low back pain past baseline.  She has had some abdominal pain from constipation lately ? ?She does have a history of BV in the past with similar symptoms. ? ? ?Review of Systems ?See HPI  ? ?Past Medical History:  ?Diagnosis Date  ? Anemia   ? Anxiety   ? Asthma   ? prn inhaler; exercise induced  ? Carpal tunnel syndrome   ? Depression   ? severe at 20weeks  ? History of seizures   ? states caused by Xanax - no seizures since 2015  ? Infection   ? BV with preg; recurrent yeast inf  ? Migraines   ? Seasonal allergies   ? Tonsillar hypertrophy 04/2016  ? ? ?Social History  ? ?Socioeconomic History  ? Marital status: Single  ?  Spouse name: Not on file  ? Number of children: Not on file  ? Years of education: Not on file  ? Highest education level: Not on file  ?Occupational History  ? Not on file  ?Tobacco Use  ? Smoking status: Never  ? Smokeless tobacco: Never  ? Tobacco comments:  ?  quit  ?Substance and Sexual Activity  ? Alcohol use: Yes  ?  Alcohol/week: 0.0 standard drinks  ?  Comment: occ  ? Drug use: No  ? Sexual activity: Yes  ?  Partners: Male  ?  Birth control/protection: None  ?Other Topics Concern  ? Not on file  ?Social History Narrative  ? Negative history of passive tobacco smoke exposure  ? Caretaker verifies today that the  child's current immunizations are up to date.  ? Lives with parents in a 2 story home.  Currently not working.  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  Education: college.  ? ?Social Determinants of Health  ? ?Financial Resource Strain: Not on file  ?Food Insecurity: Not on file  ?Transportation Needs: Not on file  ?Physical Activity: Not on file  ?Stress: Not on file  ?Social Connections: Not on file  ?Intimate Partner Violence: Not on file  ? ? ?Past Surgical History:  ?Procedure Laterality Date  ? KNEE ARTHROSCOPY Left 02/28/2014  ? Procedure: ARTHROSCOPY LEFT KNEE WITH SYNOVECTOMY LIMITED, PARTIAL LATERAL MENISECTOMY;  Surgeon: Loreta Aveaniel F Murphy, MD;  Location: [facility];  Service: Orthopedics;  Laterality: Left;  ? TONSILLECTOMY AND ADENOIDECTOMY Bilateral 05/25/2016  ? Procedure: TONSILLECTOMY AND ADENOIDECTOMY;  Surgeon: Newman PiesSu Teoh, MD;  Location: [facility];  Service: ENT;  Laterality: Bilateral;  ? WISDOM TOOTH EXTRACTION  2015  ? ? ?Family History  ?Problem Relation Age of Onset  ? Diabetes Mother   ? Cancer Maternal Grandfather   ? ? ?Allergies  ?Allergen Reactions  ? Alprazolam Other (See Comments)  ?  SEIZURES ?Other  reaction(s): Other (See Comments) ?SEIZURES ?  ? Bee Venom Hives and Swelling  ? Tetanus Toxoids Hives  ? ? ?Current Outpatient Medications on File Prior to Visit  ?Medication Sig Dispense Refill  ? acetaminophen-codeine (TYLENOL #3) 300-30 MG tablet Take 1 tablet by mouth 2 (two) times daily as needed for moderate pain. 10 tablet 0  ? butalbital-aspirin-caffeine (FIORINAL) 50-325-40 MG capsule Take 1 capsule by mouth every 6 (six) hours as needed for headache. 60 capsule 1  ? cyclobenzaprine (FLEXERIL) 10 MG tablet TAKE ONE TABLET BY MOUTH THREE TIMES A DAY AS NEEDED FOR MUSCLE SPASMS 60 tablet 3  ? diphenhydrAMINE HCl (BENADRYL PO) Take 1 tablet by mouth as needed.    ? escitalopram (LEXAPRO) 10 MG tablet Take 1 tablet (10 mg total) by  mouth daily. 90 tablet 3  ? fluticasone (FLONASE) 50 MCG/ACT nasal spray Place 2 sprays into both nostrils daily.    ? gabapentin (NEURONTIN) 300 MG capsule Take 300 mg by mouth 2 (two) times daily.    ? Levocetirizine Dihydrochloride (XYZAL ALLERGY 24HR PO) Take 1 tablet by mouth daily.    ? lidocaine (LIDODERM) 5 % Place 1 patch onto the skin daily. Remove & Discard patch within 12 hours or as directed by MD 14 patch 0  ? meloxicam (MOBIC) 15 MG tablet Take 15 mg by mouth daily as needed.    ? [name] 3-0.02 MG tablet TAKE ONE TABLET BY MOUTH DAILY 84 tablet 1  ? ondansetron (ZOFRAN ODT) 8 MG disintegrating tablet Take 1 tablet (8 mg total) by mouth every 8 (eight) hours as needed for nausea or vomiting. 12 tablet 0  ? oxyCODONE-acetaminophen (PERCOCET/ROXICET) 5-325 MG tablet Take 1 tablet by mouth every 6 (six) hours as needed for up to 12 doses for severe pain. 12 tablet 0  ? rizatriptan (MAXALT) 10 MG tablet Take 1 tablet (10 mg total) by mouth as needed for migraine. May repeat in 2 hours if needed 10 tablet 3  ? topiramate (TOPAMAX) 25 MG tablet TAKE ONE TABLET BY MOUTH EVERY NIGHT AT BEDTIME 30 tablet 2  ? ?No current facility-administered medications on file prior to visit.  ? ? ?BP 102/60   Pulse (!) 107   Temp 98 ?F (36.7 ?C)   Wt 150 lb (68 kg)   SpO2 98%   BMI 26.57 kg/m?  ? ? ?   ?Objective:  ? Physical Exam ?Vitals and nursing note reviewed.  ?Constitutional:   ?   Appearance: Normal appearance.  ?Pulmonary:  ?   Effort: Pulmonary effort is normal.  ?   Breath sounds: Normal breath sounds.  ?Abdominal:  ?   General: Abdomen is flat. Bowel sounds are normal.  ?   Palpations: Abdomen is soft.  ?Musculoskeletal:     ?   General: Normal range of motion.  ?Skin: ?   General: Skin is warm.  ?Neurological:  ?   General: No focal deficit present.  ?   Mental Status: She is alert and oriented to person, place, and time.  ?Psychiatric:     ?   Mood and Affect: Mood normal.     ?   Behavior: Behavior normal.      ?   Thought Content: Thought content normal.     ?   Judgment: Judgment normal.  ? ?   ?Assessment & Plan:  ?1. Bacterial vaginosis ?-Treat with Flagyl due to signs and symptoms.  Encouraged safe sex practices.  Can take probiotics to help prevent  BV in the future. ?- metroNIDAZOLE (FLAGYL) 500 MG tablet; Take 1 tablet (500 mg total) by mouth 3 (three) times daily for 7 days.  Dispense: 21 tablet; Refill: 0 ?- Cervicovaginal ancillary only ? ?2. Screening examination for STD (sexually transmitted disease) ?- Encouraged safe sex practices ?- Cervicovaginal ancillary only ?- RPR; Future ?- HIV Antibody (routine testing w rflx); Future ?- RPR ?- HIV Antibody (routine testing w rflx) ? ?Shirline Frees, NP ? ? ?Time of total for care on the day of the encounter 30 minutes. This includes time spent in both face-to-face and non-face-to-face activities including preparing for the visit, reviewing the chart, time spent with patient, evaluation and counseling patient/family/caregiver, coordinating care, and time spent documenting in the chart which was performed on the date of service (10/09/2021). Note: this excludes any time spent performing billable procedures or separate charges (such as time spent counseling smoking cessation); these charges are billed separately.  ? ?

## 2021-10-12 LAB — RPR: RPR Ser Ql: NONREACTIVE

## 2021-10-12 LAB — HIV ANTIBODY (ROUTINE TESTING W REFLEX): HIV 1&2 Ab, 4th Generation: NONREACTIVE

## 2021-10-13 ENCOUNTER — Telehealth: Payer: Self-pay | Admitting: Orthopaedic Surgery

## 2021-10-13 ENCOUNTER — Other Ambulatory Visit: Payer: Self-pay | Admitting: Orthopaedic Surgery

## 2021-10-13 ENCOUNTER — Ambulatory Visit: Payer: Medicaid Other | Admitting: Orthopaedic Surgery

## 2021-10-13 DIAGNOSIS — G8929 Other chronic pain: Secondary | ICD-10-CM

## 2021-10-13 MED ORDER — ACETAMINOPHEN-CODEINE #3 300-30 MG PO TABS: 1.0000 | ORAL_TABLET | Freq: Two times a day (BID) | ORAL | 0 refills | Status: DC | PRN

## 2021-10-13 NOTE — Telephone Encounter (Signed)
Please advise 

## 2021-10-13 NOTE — Telephone Encounter (Signed)
Pt called requesting a refill of pain medication dn also asking for a referral to be sent for physical therapy. Pt states she not getting any better and she is too sick to come to appt today. Please send to pharmacy on file. Phone number is [phone]. ?

## 2021-10-13 NOTE — Telephone Encounter (Signed)
I called patient and advised. Referral put in for PT and sent to [facility] for Work Comp approval. Patient aware of process. ?

## 2021-10-14 ENCOUNTER — Other Ambulatory Visit: Payer: Self-pay | Admitting: Adult Health

## 2021-10-14 LAB — CERVICOVAGINAL ANCILLARY ONLY
Bacterial Vaginitis (gardnerella): POSITIVE — AB
Chlamydia: POSITIVE — AB
Comment: NEGATIVE
Comment: NEGATIVE
Comment: NEGATIVE
Comment: NORMAL
Neisseria Gonorrhea: NEGATIVE
Trichomonas: NEGATIVE

## 2021-10-14 MED ORDER — DOXYCYCLINE HYCLATE 100 MG PO CAPS: 100.0000 mg | ORAL_CAPSULE | Freq: Two times a day (BID) | ORAL | 0 refills | Status: DC

## 2021-10-15 ENCOUNTER — Telehealth: Payer: Self-pay | Admitting: Family Medicine

## 2021-10-15 MED ORDER — DOXYCYCLINE HYCLATE 100 MG PO CAPS: 100.0000 mg | ORAL_CAPSULE | Freq: Two times a day (BID) | ORAL | 0 refills | Status: DC

## 2021-10-15 NOTE — Telephone Encounter (Signed)
Pt is calling and her son accidentally throw abx in toilet and she only took one of doxycycline (VIBRAMYCIN) 100 MG capsule and would like another refill send to  ?[id number] - [place], [place] - [address] Phone:  [phone]  ?Fax:  [phone]  ?  ? ?

## 2021-10-15 NOTE — Telephone Encounter (Signed)
Patient was last seen on 10/09/2021 by [facility].  Pharmacy updated ?

## 2021-10-15 NOTE — Telephone Encounter (Signed)
Prescription refilled to pharmacy requested.

## 2021-10-16 ENCOUNTER — Other Ambulatory Visit: Payer: Self-pay | Admitting: Orthopaedic Surgery

## 2021-10-16 ENCOUNTER — Telehealth: Payer: Self-pay | Admitting: Orthopaedic Surgery

## 2021-10-16 ENCOUNTER — Other Ambulatory Visit: Payer: Self-pay

## 2021-10-16 DIAGNOSIS — B9689 Other specified bacterial agents as the cause of diseases classified elsewhere: Secondary | ICD-10-CM

## 2021-10-16 MED ORDER — ACETAMINOPHEN-CODEINE #3 300-30 MG PO TABS: 1.0000 | ORAL_TABLET | Freq: Two times a day (BID) | ORAL | 0 refills | Status: DC | PRN

## 2021-10-16 MED ORDER — METRONIDAZOLE 500 MG PO TABS: 500.0000 mg | ORAL_TABLET | Freq: Three times a day (TID) | ORAL | 0 refills | Status: AC

## 2021-10-16 NOTE — Progress Notes (Signed)
Tylenol # 3 resent.  ?

## 2021-10-16 NOTE — Telephone Encounter (Signed)
Pt Rx sent and notified via MyChart ?

## 2021-10-16 NOTE — Telephone Encounter (Signed)
I left voicemail for patient advising. 

## 2021-10-16 NOTE — Telephone Encounter (Signed)
Call in Metronidazole 500 mg TID for 7 days ?

## 2021-10-16 NOTE — Telephone Encounter (Signed)
You entered this rx for refill, however, it was set to phone in and not send electronically. Can you resend or can I call in? ?

## 2021-10-16 NOTE — Telephone Encounter (Signed)
Patient called. Says the pharmacy does not have the RX for the tylenol. Would like that called in for her pain. Her call back number is [phone]  ?

## 2021-10-19 ENCOUNTER — Telehealth: Payer: Self-pay | Admitting: Family Medicine

## 2021-10-19 NOTE — Telephone Encounter (Signed)
Patient called in requesting a medication refill for doxycycline (VIBRAMYCIN) 100 MG capsule [[id number]]  to be sent to her pharmacy. ? ?Please advise. ?

## 2021-10-20 NOTE — Telephone Encounter (Signed)
Patient called in stating that this is an urgent matter. Patient would like this refilled as soon as possible. ? ?Please advise. ?

## 2021-10-21 NOTE — Telephone Encounter (Signed)
Please advise 

## 2021-10-21 NOTE — Telephone Encounter (Signed)
LVM for pt to call back for clarification on request.  ? ?Medication was prescribed by [facility] on 10/14/21 & refilled at pt request on 10/15/21 b/c her son threw it in the trash.  ?Pt needs to be triaged for continued symptoms. ?

## 2021-10-21 NOTE — Telephone Encounter (Signed)
Pt is calling back and the wrong medication was refill she needs doxycycline not BV medication . Pt is still having symptoms she has STD  ?[facility] [id number] - [place], [address] Phone:  [phone]  ?Fax:  [phone]  ?  ? ?

## 2021-10-22 ENCOUNTER — Other Ambulatory Visit: Payer: Self-pay

## 2021-10-22 MED ORDER — DOXYCYCLINE HYCLATE 100 MG PO CAPS: ORAL_CAPSULE | ORAL | 0 refills | Status: DC

## 2021-10-22 NOTE — Telephone Encounter (Signed)
Finally able to speak with pt verbalized understanding of Dr Sarajane Jews advise ?

## 2021-10-22 NOTE — Telephone Encounter (Signed)
Call in Doxycycline 100 mg BID for 10 days. Remind her that her partner also needs to be treated with this medication or else she will keep getting reinfected  ?

## 2021-10-22 NOTE — Telephone Encounter (Signed)
Rx was sent to pt pharmacy and pt was notified of Dr Clent Ridges advise via MyChart ?

## 2021-10-24 ENCOUNTER — Other Ambulatory Visit: Payer: Self-pay | Admitting: Family Medicine

## 2021-11-10 ENCOUNTER — Ambulatory Visit: Payer: Medicaid Other | Admitting: Family Medicine

## 2021-11-24 ENCOUNTER — Ambulatory Visit (INDEPENDENT_AMBULATORY_CARE_PROVIDER_SITE_OTHER): Payer: Medicaid Other | Admitting: Family Medicine

## 2021-11-24 ENCOUNTER — Ambulatory Visit: Payer: Medicaid Other | Admitting: Orthopaedic Surgery

## 2021-11-24 ENCOUNTER — Encounter: Payer: Self-pay | Admitting: Family Medicine

## 2021-11-24 ENCOUNTER — Telehealth: Payer: Self-pay | Admitting: Family Medicine

## 2021-11-24 VITALS — BP 120/86 | HR 84 | Temp 98.5°F | Wt 142.0 lb

## 2021-11-24 DIAGNOSIS — J40 Bronchitis, not specified as acute or chronic: Secondary | ICD-10-CM

## 2021-11-24 DIAGNOSIS — R0989 Other specified symptoms and signs involving the circulatory and respiratory systems: Secondary | ICD-10-CM

## 2021-11-24 DIAGNOSIS — R059 Cough, unspecified: Secondary | ICD-10-CM

## 2021-11-24 LAB — POCT INFLUENZA A/B
Influenza A, POC: NEGATIVE
Influenza B, POC: NEGATIVE

## 2021-11-24 LAB — POC COVID19 BINAXNOW: SARS Coronavirus 2 Ag: NEGATIVE

## 2021-11-24 MED ORDER — AZITHROMYCIN 250 MG PO TABS: ORAL_TABLET | ORAL | 0 refills | Status: DC

## 2021-11-24 MED ORDER — HYDROCODONE BIT-HOMATROP MBR 5-1.5 MG/5ML PO SOLN: 5.0000 mL | ORAL | 0 refills | Status: DC | PRN

## 2021-11-24 MED ORDER — IRON (FERROUS SULFATE) 325 (65 FE) MG PO TABS: 325.0000 mg | ORAL_TABLET | Freq: Every day | ORAL | 3 refills | Status: DC

## 2021-11-24 NOTE — Telephone Encounter (Signed)
Pt.notified

## 2021-11-24 NOTE — Telephone Encounter (Signed)
Done

## 2021-11-24 NOTE — Progress Notes (Signed)
? ?  Subjective:  ? ? Patient ID: Alexandra Meadows, female    DOB: [birth year], 26 y.o.   MRN: [id number] ? ?HPI ?Here for 4 days of a deep dry cough, sweats, and fatigue. No fever or chest pain or SOB. Drinking fluids and taking Robitussin.  ? ? ?Review of Systems  ?Constitutional:  Positive for diaphoresis and fatigue. Negative for fever.  ?HENT: Negative.    ?Eyes: Negative.   ?Respiratory:  Positive for cough. Negative for shortness of breath and wheezing.   ?Cardiovascular: Negative.   ?Gastrointestinal: Negative.   ?Musculoskeletal:  Positive for back pain.  ? ?   ?Objective:  ? Physical Exam ?Constitutional:   ?   Comments: Crying, she appears quite ill   ?Cardiovascular:  ?   Rate and Rhythm: Normal rate and regular rhythm.  ?   Pulses: Normal pulses.  ?   Heart sounds: Normal heart sounds.  ?Pulmonary:  ?   Effort: Pulmonary effort is normal. No respiratory distress.  ?   Breath sounds: Normal breath sounds. No stridor. No wheezing, rhonchi or rales.  ?Lymphadenopathy:  ?   Cervical: No cervical adenopathy.  ?Neurological:  ?   Mental Status: She is alert.  ? ? ? ? ? ?   ?Assessment & Plan:  ?Bronchitis, treat with a Zpack. Add Hycodan for the cough.  ?Alysia Penna, MD ? ? ?

## 2021-11-24 NOTE — Telephone Encounter (Signed)
Pharmacy updated.

## 2021-11-24 NOTE — Telephone Encounter (Signed)
Original pharmacy does not have cough syrup until 12/23/2021 Pt requesting HYDROcodone bit-homatropine (HYCODAN) 5-1.5 MG/5ML syrup  and azithromycin (ZITHROMAX Z-PAK) 250 MG tablet  be sent to  ?[id number] - [place], [place] Phone:  [phone]  ?Fax:  [phone]  ?  ? ?

## 2021-12-01 ENCOUNTER — Encounter: Payer: Medicaid Other | Admitting: Family Medicine

## 2021-12-22 ENCOUNTER — Ambulatory Visit (INDEPENDENT_AMBULATORY_CARE_PROVIDER_SITE_OTHER): Payer: Worker's Compensation | Admitting: Orthopaedic Surgery

## 2021-12-22 ENCOUNTER — Encounter: Payer: Self-pay | Admitting: Orthopaedic Surgery

## 2021-12-22 VITALS — BP 128/80 | HR 82 | Ht 63.0 in | Wt 140.0 lb

## 2021-12-22 DIAGNOSIS — M5459 Other low back pain: Secondary | ICD-10-CM | POA: Diagnosis not present

## 2021-12-22 DIAGNOSIS — M545 Low back pain, unspecified: Secondary | ICD-10-CM

## 2021-12-22 DIAGNOSIS — G8929 Other chronic pain: Secondary | ICD-10-CM | POA: Diagnosis not present

## 2021-12-22 MED ORDER — NAPROXEN 500 MG PO TABS: 500.0000 mg | ORAL_TABLET | Freq: Two times a day (BID) | ORAL | 1 refills | Status: DC

## 2021-12-22 NOTE — Progress Notes (Addendum)
Office Visit Note   Patient: Alexandra Meadows           Date of Birth: May 04, 1996           MRN: [id number] Visit Date: 12/22/2021              Requested by: Laurey Morale, MD [place],  [place] [address] PCP: Laurey Morale, MD   Assessment & Plan: Visit Diagnoses: Lumbar strain with  bone stress reaction    Plan: Patient assigned impairment rating of 4% of the back.  Patient is at [employer].  She can gradually resume work activities and we discussed avoiding furniture lifting during her move.  Work slip given for work resumption without restrictions.  Patient is at [place] she is rated and released.  Follow-Up Instructions: No follow-ups on file.   Orders:  No orders of the defined types were placed in this encounter.  Meds ordered this encounter  Medications   naproxen (NAPROSYN) 500 MG tablet    Sig: Take 1 tablet (500 mg total) by mouth 2 (two) times daily with a meal.    Dispense:  60 tablet    Refill:  1      Procedures: No procedures performed   Clinical Data: No additional findings.   Subjective: Chief Complaint  Patient presents with   Lower Back - Follow-up    OTJI 06/02/2021    HPI 26 year old female returns for follow-up on-the-job injury 06/02/2021 with back pain and MRI findings of pedicle edema right L5 left L4 and L3.  Minimal disc bulges.  No severe foraminal central compression is present.  She has been to physical therapy, back brace, TENS unit.  She does take a lot of ibuprofen and is requesting a prescription for Naprosyn.  Patient's husband is a marine and he is been reassigned and they are moving to [place].  She has 2 children age 4 and the other is 28 and half she is made modifications to avoid lifting activities as best she can.  States she continues occasionally have symptoms with leg numbness.  She has depression and takes her medication with good compliance.  Review of Systems previous history of carpal tunnel release.  All  the systems are noncontributory HPI.   Objective: Vital Signs: BP 128/80   Pulse 82   Ht 5\' 3"  (1.6 m)   Wt 140 lb (63.5 kg)   BMI 24.80 kg/m   Physical Exam Constitutional:      Appearance: She is well-developed.  HENT:     Head: Normocephalic.     Right Ear: External ear normal.     Left Ear: External ear normal. There is no impacted cerumen.  Eyes:     Pupils: Pupils are equal, round, and reactive to light.  Neck:     Thyroid: No thyromegaly.     Trachea: No tracheal deviation.  Cardiovascular:     Rate and Rhythm: Normal rate.  Pulmonary:     Effort: Pulmonary effort is normal.  Abdominal:     Palpations: Abdomen is soft.  Musculoskeletal:     Cervical back: No rigidity.  Skin:    General: Skin is warm and dry.  Neurological:     Mental Status: She is alert and oriented to person, place, and time.  Psychiatric:        Behavior: Behavior normal.    Ortho Exam patient has some mild sciatic notch tenderness Leg raising 90 degrees no trochanteric bursal tenderness negative FABER  test.  She has discomfort with forward bending as well as extension less discomfort with lateral bending right and left.  Normal heel and toe walking.  Specialty Comments:  No specialty comments available.  Imaging: No results found.   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   Anxiety and depression 02/17/2014   INSOMNIA 03/20/2008   ADHD 08/29/2007   Migraine without aura 03/29/2007   Past Medical History:   Diagnosis Date   Anemia    Anxiety    Asthma    prn inhaler; exercise induced   Carpal tunnel syndrome    Depression    severe at 20weeks   History of seizures    states caused by Xanax - no seizures since 2015   Infection    BV with preg; recurrent yeast inf   Migraines    Seasonal allergies    Tonsillar hypertrophy 04/2016    Family History  Problem Relation Age of Onset   Diabetes Mother    Cancer Maternal Grandfather     Past Surgical History:  Procedure Laterality Date   KNEE ARTHROSCOPY Left 02/28/2014   Procedure: ARTHROSCOPY LEFT KNEE WITH SYNOVECTOMY LIMITED, PARTIAL LATERAL MENISECTOMY;  Surgeon: Ninetta Lights, MD;  Location: [facility];  Service: Orthopedics;  Laterality: Left;   TONSILLECTOMY AND ADENOIDECTOMY Bilateral 05/25/2016   Procedure: TONSILLECTOMY AND ADENOIDECTOMY;  Surgeon: Leta Baptist, MD;  Location: [facility];  Service: ENT;  Laterality: Bilateral;   [place] EXTRACTION  2015   Social History   Occupational History   Not on file  Tobacco Use   Smoking status: Never   Smokeless tobacco: Never   Tobacco comments:    quit  Substance and Sexual Activity   Alcohol use: Yes    Alcohol/week: 0.0 standard drinks    Comment: occ   Drug use: No   Sexual activity: Yes    Partners: Male    Birth control/protection: None

## 2021-12-24 ENCOUNTER — Other Ambulatory Visit: Payer: Self-pay | Admitting: Family Medicine

## 2022-01-04 ENCOUNTER — Ambulatory Visit (INDEPENDENT_AMBULATORY_CARE_PROVIDER_SITE_OTHER): Payer: 59 | Admitting: Family Medicine

## 2022-01-04 ENCOUNTER — Encounter: Payer: Self-pay | Admitting: Family Medicine

## 2022-01-04 VITALS — BP 100/70 | HR 90 | Temp 97.6°F | Ht 63.0 in | Wt 141.6 lb

## 2022-01-04 DIAGNOSIS — J029 Acute pharyngitis, unspecified: Secondary | ICD-10-CM

## 2022-01-04 DIAGNOSIS — J040 Acute laryngitis: Secondary | ICD-10-CM

## 2022-01-04 DIAGNOSIS — R059 Cough, unspecified: Secondary | ICD-10-CM

## 2022-01-04 MED ORDER — BENZONATATE 100 MG PO CAPS: 100.0000 mg | ORAL_CAPSULE | Freq: Three times a day (TID) | ORAL | 0 refills | Status: DC | PRN

## 2022-01-04 MED ORDER — ALBUTEROL SULFATE HFA 108 (90 BASE) MCG/ACT IN AERS: 2.0000 | INHALATION_SPRAY | Freq: Four times a day (QID) | RESPIRATORY_TRACT | 0 refills | Status: AC | PRN

## 2022-01-04 MED ORDER — AZITHROMYCIN 250 MG PO TABS: ORAL_TABLET | ORAL | 0 refills | Status: DC

## 2022-01-04 NOTE — Progress Notes (Signed)
Established Patient Office Visit  Subjective   Patient ID: Alexandra Meadows, female    DOB: Nov 07, 1995  Age: 26 y.o. MRN: [id number]  Chief Complaint  Patient presents with   Laryngitis    Patient complains of laryngitis, Patient reports greenish discharge with blood, x1 week    Sore Throat    Patient complains of sore throat, x1 week,    Cough    Patient complains of cough , x1 week, Productive cough with greenish discharge, Tried Sudafed, Afrin, Tylenol and Ibuprofen with little relief   Hoarse    HPI   Seen with multiple symptoms.  She states for the past week or so she has had some cough which is now productive of greenish sputum.  She tried multiple over-the-counter things including Afrin, Sudafed, Tylenol, ibuprofen without relief.  She had occasional blood-tinged mucus.  She had some laryngitis symptoms past week and also sore throat for the past week.  She has had chills intermittently but no documented fever.  Home COVID test was negative on Friday.  She vapes.  She has history of asthma but no obvious wheezing.  Does request refill albuterol inhaler. She states over the weekend she felt like she was getting worse instead of better.  Past Medical History:  Diagnosis Date   Anemia    Anxiety    Asthma    prn inhaler; exercise induced   Carpal tunnel syndrome    Depression    severe at 20weeks   History of seizures    states caused by Xanax - no seizures since 2015   Infection    BV with preg; recurrent yeast inf   Migraines    Seasonal allergies    Tonsillar hypertrophy 04/2016   Past Surgical History:  Procedure Laterality Date   KNEE ARTHROSCOPY Left 02/28/2014   Procedure: ARTHROSCOPY LEFT KNEE WITH SYNOVECTOMY LIMITED, PARTIAL LATERAL MENISECTOMY;  Surgeon: Loreta Ave, MD;  Location: [facility];  Service: Orthopedics;  Laterality: Left;   TONSILLECTOMY AND ADENOIDECTOMY Bilateral 05/25/2016   Procedure: TONSILLECTOMY AND ADENOIDECTOMY;   Surgeon: Newman Pies, MD;  Location: [facility];  Service: ENT;  Laterality: Bilateral;   WISDOM TOOTH EXTRACTION  2015    reports that she has never smoked. She has never used smokeless tobacco. She reports current alcohol use. She reports that she does not use drugs. family history includes Cancer in her maternal grandfather; Diabetes in her mother. Allergies  Allergen Reactions   Alprazolam Other (See Comments)    SEIZURES Other reaction(s): Other (See Comments) SEIZURES    Bee Venom Hives and Swelling   Tetanus Toxoids Hives    Review of Systems  Constitutional:  Positive for chills. Negative for fever.  HENT:  Positive for congestion and sore throat.   Respiratory:  Positive for cough.   Cardiovascular:  Negative for chest pain.      Objective:     BP 100/70 (BP Location: Left Arm, Patient Position: Sitting, Cuff Size: Normal)   Pulse 90   Temp 97.6 F (36.4 C) (Oral)   Ht 5\' 3"  (1.6 m)   Wt 141 lb 9.6 oz (64.2 kg)   SpO2 98%   BMI 25.08 kg/m    Physical Exam Vitals reviewed.  Constitutional:      General: She is not in acute distress.    Appearance: She is not ill-appearing.  HENT:     Right Ear: Tympanic membrane normal.     Left Ear: Tympanic membrane normal.  Mouth/Throat:     Pharynx: Oropharynx is clear. No oropharyngeal exudate or posterior oropharyngeal erythema.  Cardiovascular:     Rate and Rhythm: Normal rate and regular rhythm.  Pulmonary:     Effort: Pulmonary effort is normal.     Breath sounds: Normal breath sounds. No wheezing or rales.  Musculoskeletal:     Cervical back: Neck supple.  Lymphadenopathy:     Cervical: No cervical adenopathy.  Neurological:     Mental Status: She is alert.      No results found for any visits on 01/04/22.    The ASCVD Risk score (Arnett DK, et al., 2019) failed to calculate for the following reasons:   The 2019 ASCVD risk score is only valid for ages 24 to 28    Assessment & Plan:    Problem List Items Addressed This Visit   None Visit Diagnoses     Cough, unspecified type    -  Primary     Patient presents with approximately 10-day history of progressive symptoms as above.  We explained that her symptoms are frequently related to a viral illness but she feels like she is getting worse instead of better and is approximately 11 days into this.  We decided to go and cover with Zithromax.  Refill albuterol inhaler for as needed use.  Tessalon Perles 100 mg every 8 hours as needed for cough.  No follow-ups on file.    Evelena Peat, MD

## 2022-01-06 ENCOUNTER — Telehealth: Payer: Self-pay | Admitting: Orthopaedic Surgery

## 2022-01-06 NOTE — Telephone Encounter (Signed)
Pt states she need a call back right away to discuss medical condition. Pt states she is confused on last visit with dr. Ophelia Charter and what his report says. Please call pt at [phone].

## 2022-01-07 ENCOUNTER — Telehealth: Payer: Self-pay | Admitting: Orthopaedic Surgery

## 2022-01-07 NOTE — Telephone Encounter (Signed)
T called requesting a call back from Dr. Ophelia Charter. Pt states she has medical questions. Please call pt at [phone]

## 2022-01-17 ENCOUNTER — Other Ambulatory Visit: Payer: Self-pay | Admitting: Family Medicine

## 2022-01-29 ENCOUNTER — Other Ambulatory Visit: Payer: Self-pay | Admitting: Family Medicine

## 2022-02-02 ENCOUNTER — Other Ambulatory Visit: Payer: Self-pay

## 2022-02-02 ENCOUNTER — Telehealth: Payer: Self-pay | Admitting: Family Medicine

## 2022-02-02 DIAGNOSIS — F419 Anxiety disorder, unspecified: Secondary | ICD-10-CM

## 2022-02-02 DIAGNOSIS — G43009 Migraine without aura, not intractable, without status migrainosus: Secondary | ICD-10-CM

## 2022-02-02 MED ORDER — ESCITALOPRAM OXALATE 10 MG PO TABS: 10.0000 mg | ORAL_TABLET | Freq: Every day | ORAL | 0 refills | Status: DC

## 2022-02-02 MED ORDER — RIZATRIPTAN BENZOATE 10 MG PO TABS: ORAL_TABLET | ORAL | 1 refills | Status: DC

## 2022-02-02 MED ORDER — DROSPIRENONE-ETHINYL ESTRADIOL 3-0.02 MG PO TABS: 1.0000 | ORAL_TABLET | Freq: Every day | ORAL | 1 refills | Status: DC

## 2022-02-02 MED ORDER — CYCLOBENZAPRINE HCL 10 MG PO TABS: ORAL_TABLET | ORAL | 0 refills | Status: DC

## 2022-02-02 NOTE — Telephone Encounter (Signed)
Lvm for patient, [facility] [place] [place].

## 2022-02-02 NOTE — Telephone Encounter (Signed)
Patient called because she has moved to [place], [place] and will need her prescriptions transferred to a new pharmacy.   Patient needs rizatriptan (MAXALT) 10 MG tablet  escitalopram (LEXAPRO) 10 MG tablet  drospirenone-ethinyl estradiol ([name]) 3-0.02 MG tablet  cyclobenzaprine (FLEXERIL) 10 MG tablet  And any other prescriptions given by Dr.Fry   Patient is in need of the Flexeril because she was not able to pick it up from the regular pharmacy in [place] before she left for her new home, so she has none.   [facility] [address] [place], [place], [place] [address]    Please advise

## 2022-03-26 ENCOUNTER — Telehealth: Payer: Self-pay

## 2022-03-26 NOTE — Telephone Encounter (Signed)
Caller states she has a staph infection and is concerned it is MRSA. She is taking medication and having a lot of pressure in her head and her blood pressure is 220/142. No fever. Caller states that she has alot of pressure in her head and her blood pressure has been really high.  03/25/2022 1:00:31 PM Go to ED Now Shawnie Dapper, RN, [facility]  Comments User: Christy Gentles, RN Date/Time Lamount Cohen Time): 03/25/2022 1:00:16 PM [facility]

## 2022-04-15 ENCOUNTER — Ambulatory Visit (HOSPITAL_COMMUNITY): Payer: Self-pay

## 2022-04-15 ENCOUNTER — Ambulatory Visit (HOSPITAL_COMMUNITY)
Admission: RE | Admit: 2022-04-15 | Discharge: 2022-04-15 | Disposition: A | Discharge status: Home / Self Care | Payer: Medicaid Other | Source: Ambulatory Visit | Attending: Family Medicine | Admitting: Family Medicine

## 2022-04-15 ENCOUNTER — Encounter (HOSPITAL_COMMUNITY): Payer: Self-pay

## 2022-04-15 VITALS — BP 124/88 | HR 76 | Temp 98.6°F | Resp 18

## 2022-04-15 DIAGNOSIS — N898 Other specified noninflammatory disorders of vagina: Secondary | ICD-10-CM | POA: Insufficient documentation

## 2022-04-15 DIAGNOSIS — L03811 Cellulitis of head [any part, except face]: Secondary | ICD-10-CM | POA: Diagnosis not present

## 2022-04-15 HISTORY — DX: Carrier or suspected carrier of methicillin resistant Staphylococcus aureus: Z22.322

## 2022-04-15 MED ORDER — MUPIROCIN 2 % EX OINT: 1.0000 | TOPICAL_OINTMENT | Freq: Two times a day (BID) | CUTANEOUS | 0 refills | Status: DC

## 2022-04-15 MED ORDER — CEPHALEXIN 250 MG PO CAPS: 250.0000 mg | ORAL_CAPSULE | Freq: Three times a day (TID) | ORAL | 0 refills | Status: AC

## 2022-04-15 NOTE — Discharge Instructions (Addendum)
Take cephalexin 250 mg--1 capsule 3 times daily for 7 days  Put mupirocin ointment on the sore areas twice daily until improved   Staff will notify you of any positives on your swab

## 2022-04-15 NOTE — ED Triage Notes (Signed)
Pt states that she didn't take her birth control this month and she is having some clear, milky discharge started yesterday she just wants to make sure she is cleared from chlamydia which she was treated for. She would like to be checked for BV, yeast, and retest for chlamydia.

## 2022-04-15 NOTE — ED Provider Notes (Signed)
[facility]    CSN: [id number] Arrival date & time: 04/15/22  0950      History   Chief Complaint Chief Complaint  Patient presents with   Exposure to STD    Had a positive result for chlamydia months ago I want to get rechecked to make sure I'm negative - Entered by patient    HPI Alexandra Meadows is a 26 y.o. female.    Exposure to STD   Here for some clear thin vaginal discharge that began in the last few days.  No abdominal pain and no fever or chills.  In March she tested positive for chlamydia and took the treatment.  She would like to be rechecked for that.  No dysuria or itching currently.   She also notes some redness and swelling around 2 or 3 piercings on her right pinna.  She did take antibiotics for MRSA a couple weeks ago, but did not finish the prescription   No abdominal pain but she has had a little bloating  Past Medical History:  Diagnosis Date   Anemia    Anxiety    Asthma    prn inhaler; exercise induced   Carpal tunnel syndrome    Depression    severe at 20weeks   History of seizures    states caused by Xanax - no seizures since 2015   Infection    BV with preg; recurrent yeast inf   Migraines    MRSA (methicillin resistant staph aureus) culture positive    Seasonal allergies    Tonsillar hypertrophy 04/2016    Patient Active Problem List   Diagnosis Date Noted   Low back pain 09/21/2021   Vertigo 08/07/2020   Environmental and seasonal allergies 03/10/2020   Chronic bilateral thoracic back pain 08/25/2018   Vaginal bleeding 01/21/2018   SVD (spontaneous vaginal delivery) 12/27/2017   Uterine size date discrepancy pregnancy, third trimester    Umbilical vein abnormality affecting pregnancy    Vitamin D deficiency 07/05/2017   Supervision of high-risk pregnancy 06/30/2017   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   Anxiety and depression 02/17/2014   INSOMNIA 03/20/2008   ADHD 08/29/2007   Migraine without aura 03/29/2007     Past Surgical History:  Procedure Laterality Date   KNEE ARTHROSCOPY Left 02/28/2014   Procedure: ARTHROSCOPY LEFT KNEE WITH SYNOVECTOMY LIMITED, PARTIAL LATERAL MENISECTOMY;  Surgeon: Ninetta Lights, MD;  Location: [facility];  Service: Orthopedics;  Laterality: Left;   TONSILLECTOMY AND ADENOIDECTOMY Bilateral 05/25/2016   Procedure: TONSILLECTOMY AND ADENOIDECTOMY;  Surgeon: Leta Baptist, MD;  Location: [facility];  Service: ENT;  Laterality: Bilateral;   WISDOM TOOTH EXTRACTION  2015    OB History     Gravida  2   Para  2   Term  2   Preterm      AB      Living  2      SAB      IAB      Ectopic      Multiple  0   Live Births  2            Home Medications    Prior to Admission medications   Medication Sig Start Date End Date Taking? Authorizing Provider  cephALEXin (KEFLEX) 250 MG capsule Take 1 capsule (250 mg total) by mouth 3 (three) times daily for 7 days. 04/15/22 04/22/22 Yes Zilda No, Gwenlyn Perking, MD  cyclobenzaprine (FLEXERIL) 10 MG tablet TAKE ONE TABLET BY MOUTH  THREE TIMES A DAY AS NEEDED FOR MUSCLE SPASMS 02/02/22  Yes Nelwyn Salisbury, MD  drospirenone-ethinyl estradiol ([name]) 3-0.02 MG tablet Take 1 tablet by mouth daily. 02/02/22  Yes Nelwyn Salisbury, MD  escitalopram (LEXAPRO) 10 MG tablet Take 1 tablet (10 mg total) by mouth daily. 02/02/22  Yes Nelwyn Salisbury, MD  mupirocin ointment (BACTROBAN) 2 % Apply 1 Application topically 2 (two) times daily. To affected area till better 04/15/22  Yes Junius Faucett, Janace Aris, MD  rizatriptan (MAXALT) 10 MG tablet TAKE ONE TABLET BY MOUTH AT ONSET OF HEADACHE; MAY REPEAT ONE TABLET IN 2 HOURS IF NEEDED. 02/02/22  Yes Nelwyn Salisbury, MD  albuterol (VENTOLIN HFA) 108 (90 Base) MCG/ACT inhaler Inhale 2 puffs into the lungs every 6 (six) hours as needed for wheezing or shortness of breath. 01/04/22   Burchette, Elberta Fortis, MD  azithromycin (ZITHROMAX) 250 MG tablet Take 250 mg by mouth daily.     [provider]  benzonatate (TESSALON PERLES) 100 MG capsule Take 1 capsule (100 mg total) by mouth 3 (three) times daily as needed for cough. 01/04/22   Burchette, Elberta Fortis, MD  diphenhydrAMINE HCl (BENADRYL PO) Take 1 tablet by mouth as needed.    [provider]  fluticasone (FLONASE) 50 MCG/ACT nasal spray Place 2 sprays into both nostrils daily.    [provider]  gabapentin (NEURONTIN) 300 MG capsule Take 300 mg by mouth 2 (two) times daily. 04/09/21   [provider]  Iron, Ferrous Sulfate, 325 (65 Fe) MG TABS Take 325 mg by mouth daily. 11/24/21   Nelwyn Salisbury, MD  naproxen (NAPROSYN) 500 MG tablet Take 1 tablet (500 mg total) by mouth 2 (two) times daily with a meal. 12/22/21   Eldred Manges, MD  ondansetron (ZOFRAN ODT) 8 MG disintegrating tablet Take 1 tablet (8 mg total) by mouth every 8 (eight) hours as needed for nausea or vomiting. 10/21/20   Burchette, Elberta Fortis, MD    Family History Family History  Problem Relation Age of Onset   Diabetes Mother    Cancer Maternal Grandfather     Social History Social History   Tobacco Use   Smoking status: Never   Smokeless tobacco: Never   Tobacco comments:    quit  Substance Use Topics   Alcohol use: Yes    Alcohol/week: 0.0 standard drinks of alcohol    Comment: occ   Drug use: No     Allergies   Alprazolam, Bee venom, and Tetanus toxoids   Review of Systems Review of Systems   Physical Exam Triage Vital Signs ED Triage Vitals  Enc Vitals Group     BP 04/15/22 1011 124/88     Pulse Rate 04/15/22 1011 76     Resp 04/15/22 1011 18     Temp 04/15/22 1011 98.6 F (37 C)     Temp Source 04/15/22 1011 Oral     SpO2 04/15/22 1011 98 %     Weight --      Height --      Head Circumference --      Peak Flow --      Pain Score 04/15/22 1008 0     Pain Loc --      Pain Edu? --      Excl. in GC? --    No data found.  Updated Vital Signs BP 124/88 (BP Location: Left Arm)   Pulse 76    Temp 98.6 F (37  C) (Oral)   Resp 18   LMP 04/02/2022 (Exact Date)   SpO2 98%   Visual Acuity Right Eye Distance:   Left Eye Distance:   Bilateral Distance:    Right Eye Near:   Left Eye Near:    Bilateral Near:     Physical Exam Constitutional:      General: She is not in acute distress.    Appearance: She is not ill-appearing, toxic-appearing or diaphoretic.  HENT:     Mouth/Throat:     Mouth: Mucous membranes are moist.  Eyes:     Extraocular Movements: Extraocular movements intact.     Pupils: Pupils are equal, round, and reactive to light.  Cardiovascular:     Rate and Rhythm: Normal rate and regular rhythm.  Skin:    Coloration: Skin is not jaundiced or pale.     Comments: There is some erythema and mild swelling around piercings on her right pinna; no drainage currently  Neurological:     General: No focal deficit present.     Mental Status: She is alert and oriented to person, place, and time.  Psychiatric:        Behavior: Behavior normal.      UC Treatments / Results  Labs (all labs ordered are listed, but only abnormal results are displayed) Labs Reviewed  CERVICOVAGINAL ANCILLARY ONLY    EKG   Radiology No results found.  Procedures Procedures (including critical care time)  Medications Ordered in UC Medications - No data to display  Initial Impression / Assessment and Plan / UC Course  I have reviewed the triage vital signs and the nursing notes.  Pertinent labs & imaging results that were available during my care of the patient were reviewed by me and considered in my medical decision making (see chart for details).        Swab is done, and we will notify her of any positives.  I did send in cephalexin for her possible cellulitis around the piercings Final Clinical Impressions(s) / UC Diagnoses   Final diagnoses:  Vaginal discharge  Cellulitis of head except face     Discharge Instructions      Take cephalexin 250 mg--1  capsule 3 times daily for 7 days  Put mupirocin ointment on the sore areas twice daily until improved   Staff will notify you of any positives on your swab    ED Prescriptions     Medication Sig Dispense Auth. Provider   cephALEXin (KEFLEX) 250 MG capsule Take 1 capsule (250 mg total) by mouth 3 (three) times daily for 7 days. 21 capsule Barrett Henle, MD   mupirocin ointment (BACTROBAN) 2 % Apply 1 Application topically 2 (two) times daily. To affected area till better 22 g Windy Carina Gwenlyn Perking, MD      PDMP not reviewed this encounter.   Barrett Henle, MD 04/15/22 [phone]

## 2022-04-19 LAB — CERVICOVAGINAL ANCILLARY ONLY
Bacterial Vaginitis (gardnerella): NEGATIVE
Chlamydia: NEGATIVE
Comment: NEGATIVE
Comment: NEGATIVE
Comment: NEGATIVE
Comment: NORMAL
Neisseria Gonorrhea: NEGATIVE
Trichomonas: NEGATIVE

## 2022-04-29 ENCOUNTER — Telehealth: Payer: Self-pay | Admitting: Orthopaedic Surgery

## 2022-04-29 NOTE — Telephone Encounter (Signed)
Can you please advise? It looks like patient was rated and released to return to work with no restrictions. Ok to send the note that states no restrictions?

## 2022-04-29 NOTE — Telephone Encounter (Signed)
We can send the same note to her, which states no restrictions. If she needs it dated for now, I will have to enter a new one for her. Do you mind finding out for me and I will take care of whatever she needs.

## 2022-04-29 NOTE — Telephone Encounter (Signed)
Pt called and states she is getting a new job so she will need another restriction note! Can we just send the same one or has anything changed?   CB [phone]

## 2022-04-30 NOTE — Telephone Encounter (Signed)
Note entered, may work with no restrictions per Dr. Lorin Mercy.

## 2022-04-30 NOTE — Telephone Encounter (Signed)
I left voicemail advising note available by My Chart and I will put a copy at front desk for pick up.

## 2022-05-17 ENCOUNTER — Telehealth: Payer: Self-pay | Admitting: Family Medicine

## 2022-05-17 NOTE — Telephone Encounter (Signed)
Pt is no longer living in [place]  and she has moved back to [place] and would like refills on cyclobenzaprine (FLEXERIL) 10 MG tablet, drospirenone-ethinyl estradiol ([name]) 3-0.02 MG tablet,escitalopram (LEXAPRO) 10 MG tablet send to   [id number] - [place], [place] - [address] Phone:  [phone]  Fax:  [phone]

## 2022-05-18 ENCOUNTER — Other Ambulatory Visit: Payer: Self-pay

## 2022-05-18 DIAGNOSIS — F419 Anxiety disorder, unspecified: Secondary | ICD-10-CM

## 2022-05-18 MED ORDER — ESCITALOPRAM OXALATE 10 MG PO TABS: 10.0000 mg | ORAL_TABLET | Freq: Every day | ORAL | 0 refills | Status: DC

## 2022-05-18 MED ORDER — DROSPIRENONE-ETHINYL ESTRADIOL 3-0.02 MG PO TABS: 1.0000 | ORAL_TABLET | Freq: Every day | ORAL | 1 refills | Status: DC

## 2022-05-18 MED ORDER — CYCLOBENZAPRINE HCL 10 MG PO TABS: ORAL_TABLET | ORAL | 0 refills | Status: DC

## 2022-05-18 NOTE — Telephone Encounter (Signed)
Rx sent to pharmacy   

## 2022-05-25 ENCOUNTER — Ambulatory Visit (HOSPITAL_COMMUNITY)
Admission: RE | Admit: 2022-05-25 | Discharge: 2022-05-25 | Disposition: A | Discharge status: Home / Self Care | Payer: Medicaid Other | Source: Ambulatory Visit | Attending: Family Medicine | Admitting: Family Medicine

## 2022-05-25 ENCOUNTER — Encounter (HOSPITAL_COMMUNITY): Payer: Self-pay

## 2022-05-25 VITALS — BP 135/85 | HR 93 | Temp 98.4°F | Resp 18

## 2022-05-25 DIAGNOSIS — R1032 Left lower quadrant pain: Secondary | ICD-10-CM

## 2022-05-25 NOTE — ED Triage Notes (Signed)
Pt states she has LLQ pain radiating to her back. She has diarrhea and needed to go home from work yesterday. She took some Mag citrate last night, she took an enema last night about midnight. She said the enema did help her have a BM but the pain isnt resolved. She said she thinks she has a impaction so she wanted to see if the laxatives would help. She took a flexeril last night without relief.

## 2022-05-25 NOTE — ED Provider Notes (Signed)
[facility]    CSN: [id number] Arrival date & time: 05/25/22  1544      History   Chief Complaint Chief Complaint  Patient presents with   Abdominal Pain    HPI Alexandra Meadows is a 26 y.o. female.  Patient complaining of left lower quadrant abdominal pain x 2 days.  Patient reports onset of symptoms began with diarrhea and turned into straining with bowel movements.  Patient reports she took mag citrate and use an enema with no relief of symptoms.  Patient reports bloating.  Patient reports urinary frequency.  Patient reports episodes of chills.  Patient denies fever.  Patient denies history of abdominal surgery gastrointestinal problems.  Patient has been sent to emergency room for further evaluation.    Abdominal Pain Associated symptoms: chills, diarrhea, fatigue and nausea   Associated symptoms: no dysuria, no fever, no hematuria and no vomiting     Past Medical History:  Diagnosis Date   Anemia    Anxiety    Asthma    prn inhaler; exercise induced   Carpal tunnel syndrome    Depression    severe at 20weeks   History of seizures    states caused by Xanax - no seizures since 2015   Infection    BV with preg; recurrent yeast inf   Migraines    MRSA (methicillin resistant staph aureus) culture positive    Seasonal allergies    Tonsillar hypertrophy 04/2016    Patient Active Problem List   Diagnosis Date Noted   Low back pain 09/21/2021   Vertigo 08/07/2020   Environmental and seasonal allergies 03/10/2020   Chronic bilateral thoracic back pain 08/25/2018   Vaginal bleeding 01/21/2018   SVD (spontaneous vaginal delivery) 12/27/2017   Uterine size date discrepancy pregnancy, third trimester    Umbilical vein abnormality affecting pregnancy    Vitamin D deficiency 07/05/2017   Supervision of high-risk pregnancy 06/30/2017   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   Anxiety and depression 02/17/2014   INSOMNIA 03/20/2008   ADHD 08/29/2007   Migraine  without aura 03/29/2007    Past Surgical History:  Procedure Laterality Date   KNEE ARTHROSCOPY Left 02/28/2014   Procedure: ARTHROSCOPY LEFT KNEE WITH SYNOVECTOMY LIMITED, PARTIAL LATERAL MENISECTOMY;  Surgeon: Ninetta Lights, MD;  Location: [facility];  Service: Orthopedics;  Laterality: Left;   TONSILLECTOMY AND ADENOIDECTOMY Bilateral 05/25/2016   Procedure: TONSILLECTOMY AND ADENOIDECTOMY;  Surgeon: Leta Baptist, MD;  Location: [facility];  Service: ENT;  Laterality: Bilateral;   WISDOM TOOTH EXTRACTION  2015    OB History     Gravida  2   Para  2   Term  2   Preterm      AB      Living  2      SAB      IAB      Ectopic      Multiple  0   Live Births  2            Home Medications    Prior to Admission medications   Medication Sig Start Date End Date Taking? Authorizing Provider  cyclobenzaprine (FLEXERIL) 10 MG tablet TAKE ONE TABLET BY MOUTH THREE TIMES A DAY AS NEEDED FOR MUSCLE SPASMS 05/18/22  Yes Laurey Morale, MD  drospirenone-ethinyl estradiol ([name]) 3-0.02 MG tablet Take 1 tablet by mouth daily. 05/18/22  Yes Laurey Morale, MD  escitalopram (LEXAPRO) 10 MG tablet Take 1 tablet (10 mg total)  by mouth daily. 05/18/22  Yes Nelwyn SalisburyFry, Stephen A, MD  albuterol (VENTOLIN HFA) 108 (90 Base) MCG/ACT inhaler Inhale 2 puffs into the lungs every 6 (six) hours as needed for wheezing or shortness of breath. 01/04/22   Burchette, Elberta FortisBruce W, MD  azithromycin (ZITHROMAX) 250 MG tablet Take 250 mg by mouth daily.    [provider]  benzonatate (TESSALON PERLES) 100 MG capsule Take 1 capsule (100 mg total) by mouth 3 (three) times daily as needed for cough. 01/04/22   Burchette, Elberta FortisBruce W, MD  diphenhydrAMINE HCl (BENADRYL PO) Take 1 tablet by mouth as needed.    [provider]  fluticasone (FLONASE) 50 MCG/ACT nasal spray Place 2 sprays into both nostrils daily.    [provider]  gabapentin (NEURONTIN) 300 MG capsule  Take 300 mg by mouth 2 (two) times daily. 04/09/21   [provider]  Iron, Ferrous Sulfate, 325 (65 Fe) MG TABS Take 325 mg by mouth daily. 11/24/21   Nelwyn SalisburyFry, Stephen A, MD  mupirocin ointment (BACTROBAN) 2 % Apply 1 Application topically 2 (two) times daily. To affected area till better 04/15/22   Zenia ResidesBanister, Pamela K, MD  naproxen (NAPROSYN) 500 MG tablet Take 1 tablet (500 mg total) by mouth 2 (two) times daily with a meal. 12/22/21   Eldred MangesYates, Mark C, MD  ondansetron (ZOFRAN ODT) 8 MG disintegrating tablet Take 1 tablet (8 mg total) by mouth every 8 (eight) hours as needed for nausea or vomiting. 10/21/20   Burchette, Elberta FortisBruce W, MD  rizatriptan (MAXALT) 10 MG tablet TAKE ONE TABLET BY MOUTH AT ONSET OF HEADACHE; MAY REPEAT ONE TABLET IN 2 HOURS IF NEEDED. 02/02/22   Nelwyn SalisburyFry, Stephen A, MD    Family History Family History  Problem Relation Age of Onset   Diabetes Mother    Cancer Maternal Grandfather     Social History Social History   Tobacco Use   Smoking status: Never   Smokeless tobacco: Never   Tobacco comments:    quit  Substance Use Topics   Alcohol use: Yes    Alcohol/week: 0.0 standard drinks of alcohol    Comment: occ   Drug use: No     Allergies   Alprazolam, Bee venom, and Tetanus toxoids   Review of Systems Review of Systems  Constitutional:  Positive for activity change, appetite change, chills and fatigue. Negative for fever.  Gastrointestinal:  Positive for abdominal distention, abdominal pain, diarrhea and nausea. Negative for blood in stool, rectal pain and vomiting.       Straining with stools after initially having diarrhea   Genitourinary:  Positive for frequency. Negative for decreased urine volume, difficulty urinating, dysuria, flank pain and hematuria.     Physical Exam Triage Vital Signs ED Triage Vitals  Enc Vitals Group     BP 05/25/22 1611 135/85     Pulse Rate 05/25/22 1611 93     Resp 05/25/22 1611 18     Temp 05/25/22 1611 98.4 F (36.9 C)      Temp Source 05/25/22 1611 Oral     SpO2 05/25/22 1611 98 %     Weight --      Height --      Head Circumference --      Peak Flow --      Pain Score 05/25/22 1607 7     Pain Loc --      Pain Edu? --      Excl. in GC? --    No  data found.  Updated Vital Signs BP 135/85 (BP Location: Left Arm)   Pulse 93   Temp 98.4 F (36.9 C) (Oral)   Resp 18   LMP 05/02/2022 (Exact Date)   SpO2 98%     Physical Exam Vitals and nursing note reviewed.  Cardiovascular:     Rate and Rhythm: Normal rate and regular rhythm.     Heart sounds: Normal heart sounds, S1 normal and S2 normal.  Pulmonary:     Effort: Pulmonary effort is normal.     Breath sounds: Normal breath sounds and air entry. No decreased breath sounds, wheezing, rhonchi or rales.  Abdominal:     General: Bowel sounds are normal. There is no distension or abdominal bruit. There are no signs of injury.     Palpations: Abdomen is soft. There is no shifting dullness, fluid wave, hepatomegaly, splenomegaly, mass or pulsatile mass.     Tenderness: There is abdominal tenderness in the periumbilical area and left lower quadrant. There is right CVA tenderness and left CVA tenderness. There is no guarding or rebound. Negative signs include Murphy's sign and McBurney's sign.      UC Treatments / Results  Labs (all labs ordered are listed, but only abnormal results are displayed) Labs Reviewed - No data to display  EKG   Radiology No results found.  Procedures Procedures (including critical care time)  Medications Ordered in UC Medications - No data to display  Initial Impression / Assessment and Plan / UC Course  I have reviewed the triage vital signs and the nursing notes.  Pertinent labs & imaging results that were available during my care of the patient were reviewed by me and considered in my medical decision making (see chart for details).     Patient and the option to do an office lab work or refer to ED for  stat lab work and CT imaging. Patient decided she would present to ED for further evaluation.  Final Clinical Impressions(s) / UC Diagnoses   Final diagnoses:  Left lower quadrant abdominal pain     Discharge Instructions      Please go to the emergency department for stat blood work and CT imaging.  Due to left lower quadrant abdominal pain and periumbilical tenderness upon assessment.      ED Prescriptions   None    PDMP not reviewed this encounter.   Flossie Dibble, NP 05/25/22 1650

## 2022-05-25 NOTE — Discharge Instructions (Signed)
Please go to the emergency department for stat blood work and CT imaging.  Due to left lower quadrant abdominal pain and periumbilical tenderness upon assessment.

## 2022-05-25 NOTE — ED Notes (Signed)
Patient is being discharged from the Urgent Care and sent to the Emergency Department via POV . Per Juventino Slovak, NP, patient is in need of higher level of care due to higher level of care needed. Patient is aware and verbalizes understanding of plan of care.  Vitals:   05/25/22 1611  BP: 135/85  Pulse: 93  Resp: 18  Temp: 98.4 F (36.9 C)  SpO2: 98%

## 2022-05-26 ENCOUNTER — Emergency Department (HOSPITAL_BASED_OUTPATIENT_CLINIC_OR_DEPARTMENT_OTHER): Payer: Medicaid Other

## 2022-05-26 ENCOUNTER — Encounter (HOSPITAL_BASED_OUTPATIENT_CLINIC_OR_DEPARTMENT_OTHER): Payer: Self-pay | Admitting: Emergency Medicine

## 2022-05-26 ENCOUNTER — Other Ambulatory Visit: Payer: Self-pay

## 2022-05-26 ENCOUNTER — Emergency Department (HOSPITAL_BASED_OUTPATIENT_CLINIC_OR_DEPARTMENT_OTHER)
Admission: EM | Admit: 2022-05-26 | Discharge: 2022-05-26 | Disposition: A | Discharge status: Home / Self Care | Payer: Medicaid Other | Attending: Emergency Medicine | Admitting: Emergency Medicine

## 2022-05-26 DIAGNOSIS — N83202 Unspecified ovarian cyst, left side: Secondary | ICD-10-CM | POA: Diagnosis not present

## 2022-05-26 DIAGNOSIS — R197 Diarrhea, unspecified: Secondary | ICD-10-CM | POA: Diagnosis not present

## 2022-05-26 DIAGNOSIS — R630 Anorexia: Secondary | ICD-10-CM | POA: Insufficient documentation

## 2022-05-26 DIAGNOSIS — R1032 Left lower quadrant pain: Secondary | ICD-10-CM | POA: Diagnosis present

## 2022-05-26 LAB — CBC WITH DIFFERENTIAL/PLATELET
Abs Immature Granulocytes: 0.03 10*3/uL (ref 0.00–0.07)
Basophils Absolute: 0 10*3/uL (ref 0.0–0.1)
Basophils Relative: 0 %
Eosinophils Absolute: 0.1 10*3/uL (ref 0.0–0.5)
Eosinophils Relative: 1 %
HCT: 35.8 % — ABNORMAL LOW (ref 36.0–46.0)
Hemoglobin: 11.9 g/dL — ABNORMAL LOW (ref 12.0–15.0)
Immature Granulocytes: 0 %
Lymphocytes Relative: 42 %
Lymphs Abs: 4.3 10*3/uL — ABNORMAL HIGH (ref 0.7–4.0)
MCH: 27.8 pg (ref 26.0–34.0)
MCHC: 33.2 g/dL (ref 30.0–36.0)
MCV: 83.6 fL (ref 80.0–100.0)
Monocytes Absolute: 0.4 10*3/uL (ref 0.1–1.0)
Monocytes Relative: 4 %
Neutro Abs: 5.4 10*3/uL (ref 1.7–7.7)
Neutrophils Relative %: 53 %
Platelets: 298 10*3/uL (ref 150–400)
RBC: 4.28 MIL/uL (ref 3.87–5.11)
RDW: 13.5 % (ref 11.5–15.5)
WBC: 10.2 10*3/uL (ref 4.0–10.5)
nRBC: 0 % (ref 0.0–0.2)

## 2022-05-26 LAB — URINALYSIS, ROUTINE W REFLEX MICROSCOPIC
Bilirubin Urine: NEGATIVE
Glucose, UA: NEGATIVE mg/dL
Hgb urine dipstick: NEGATIVE
Ketones, ur: NEGATIVE mg/dL
Leukocytes,Ua: NEGATIVE
Nitrite: NEGATIVE
Protein, ur: NEGATIVE mg/dL
Specific Gravity, Urine: 1.015 (ref 1.005–1.030)
pH: 6.5 (ref 5.0–8.0)

## 2022-05-26 LAB — COMPREHENSIVE METABOLIC PANEL
ALT: 26 U/L (ref 0–44)
AST: 21 U/L (ref 15–41)
Albumin: 4.2 g/dL (ref 3.5–5.0)
Alkaline Phosphatase: 54 U/L (ref 38–126)
Anion gap: 9 (ref 5–15)
BUN: 7 mg/dL (ref 6–20)
CO2: 27 mmol/L (ref 22–32)
Calcium: 9 mg/dL (ref 8.9–10.3)
Chloride: 101 mmol/L (ref 98–111)
Creatinine, Ser: 0.71 mg/dL (ref 0.44–1.00)
GFR, Estimated: >60 mL/min (ref >60)
Glucose, Bld: 96 mg/dL (ref 70–99)
Potassium: 3.9 mmol/L (ref 3.5–5.1)
Sodium: 137 mmol/L (ref 135–145)
Total Bilirubin: 0.6 mg/dL (ref 0.3–1.2)
Total Protein: 7 g/dL (ref 6.5–8.1)

## 2022-05-26 LAB — PREGNANCY, URINE: Preg Test, Ur: NEGATIVE

## 2022-05-26 MED ORDER — LACTATED RINGERS IV BOLUS
1000.0000 mL | Freq: Once | INTRAVENOUS | Status: AC
  Administered 2022-05-26: 1000 mL via INTRAVENOUS

## 2022-05-26 MED ORDER — ONDANSETRON HCL 4 MG/2ML IJ SOLN
4.0000 mg | Freq: Once | INTRAMUSCULAR | Status: AC
  Administered 2022-05-26: 4 mg via INTRAVENOUS
  Filled 2022-05-26: qty 2

## 2022-05-26 MED ORDER — KETOROLAC TROMETHAMINE 15 MG/ML IJ SOLN
15.0000 mg | Freq: Once | INTRAMUSCULAR | Status: AC
  Administered 2022-05-26: 15 mg via INTRAVENOUS
  Filled 2022-05-26: qty 1

## 2022-05-26 MED ORDER — IOHEXOL 300 MG/ML  SOLN
100.0000 mL | Freq: Once | INTRAMUSCULAR | Status: AC | PRN
  Administered 2022-05-26: 80 mL via INTRAVENOUS

## 2022-05-26 MED ORDER — HYDROCODONE-ACETAMINOPHEN 5-325 MG PO TABS: 1.0000 | ORAL_TABLET | Freq: Four times a day (QID) | ORAL | 0 refills | Status: DC | PRN

## 2022-05-26 MED ORDER — MORPHINE SULFATE (PF) 4 MG/ML IV SOLN
4.0000 mg | Freq: Once | INTRAVENOUS | Status: AC
  Administered 2022-05-26: 4 mg via INTRAVENOUS
  Filled 2022-05-26: qty 1

## 2022-05-26 NOTE — ED Triage Notes (Signed)
Presents for N/D, loss of appetite, LLQ pain and periumbilical pain which was diagnosed as diverticulitis at [facility] yesterday. Was instructed to present to the ER yesterday but waited until today when pain worsened.

## 2022-05-26 NOTE — Discharge Instructions (Signed)
You can take 2 Tylenol and 2 ibuprofen together every 6 hours to help with the pain but if that does not take care of the pain you are given a short course of pain medication to use.

## 2022-05-26 NOTE — ED Provider Notes (Signed)
[facility] [place] EMERGENCY DEPT Provider Note   CSN: [id number] Arrival date & time: 05/26/22  5885     History  Chief Complaint  Patient presents with   Abdominal Pain    Alexandra Meadows is a 26 y.o. female.  Patient is a 26 year old female presenting today with complaints of abdominal pain and diarrhea.  Patient reports on Sunday which is 4 days prior to arrival she started having some pain in her lower abdomen and feeling slightly unwell.  Monday she really started feeling bad with lower abdominal pain, significant diarrhea and anorexia.  She had to leave work and went home and slept but after waking up the diarrhea had stopped but she continued to have lower abdominal pain.  Because it persisted she went to urgent care yesterday.  At that time she was found to have left lower quadrant tenderness without rebound or guarding.  She reports frequent urination but no dysuria or urgency and no localizing flank pain.  She has no fevers, cough, congestion or shortness of breath.  She has had no vomiting but does feel nauseated.  She takes no opiates regularly and denies any recent medication changes for her depression.  She did try to take magnesium citrate because she felt like she needed to go to the bathroom however having multiple episodes of diarrhea did not resolve her abdominal pain.  The history is provided by the patient and medical records.  Abdominal Pain      Home Medications Prior to Admission medications   Medication Sig Start Date End Date Taking? Authorizing Provider  HYDROcodone-acetaminophen (NORCO/VICODIN) 5-325 MG tablet Take 1 tablet by mouth every 6 (six) hours as needed for severe pain. 05/26/22  Yes Cathryne Mancebo, Loree Fee, MD  albuterol (VENTOLIN HFA) 108 (90 Base) MCG/ACT inhaler Inhale 2 puffs into the lungs every 6 (six) hours as needed for wheezing or shortness of breath. 01/04/22   Burchette, Alinda Sierras, MD  azithromycin (ZITHROMAX) 250 MG tablet Take 250 mg  by mouth daily.    [provider]  benzonatate (TESSALON PERLES) 100 MG capsule Take 1 capsule (100 mg total) by mouth 3 (three) times daily as needed for cough. 01/04/22   Burchette, Alinda Sierras, MD  cyclobenzaprine (FLEXERIL) 10 MG tablet TAKE ONE TABLET BY MOUTH THREE TIMES A DAY AS NEEDED FOR MUSCLE SPASMS 05/18/22   Laurey Morale, MD  diphenhydrAMINE HCl (BENADRYL PO) Take 1 tablet by mouth as needed.    [provider]  drospirenone-ethinyl estradiol ([name]) 3-0.02 MG tablet Take 1 tablet by mouth daily. 05/18/22   Laurey Morale, MD  escitalopram (LEXAPRO) 10 MG tablet Take 1 tablet (10 mg total) by mouth daily. 05/18/22   Laurey Morale, MD  fluticasone (FLONASE) 50 MCG/ACT nasal spray Place 2 sprays into both nostrils daily.    [provider]  gabapentin (NEURONTIN) 300 MG capsule Take 300 mg by mouth 2 (two) times daily. 04/09/21   [provider]  Iron, Ferrous Sulfate, 325 (65 Fe) MG TABS Take 325 mg by mouth daily. 11/24/21   Laurey Morale, MD  mupirocin ointment (BACTROBAN) 2 % Apply 1 Application topically 2 (two) times daily. To affected area till better 04/15/22   Barrett Henle, MD  naproxen (NAPROSYN) 500 MG tablet Take 1 tablet (500 mg total) by mouth 2 (two) times daily with a meal. 12/22/21   Marybelle Killings, MD  ondansetron (ZOFRAN ODT) 8 MG disintegrating tablet Take 1 tablet (8 mg total) by mouth every  8 (eight) hours as needed for nausea or vomiting. 10/21/20   Burchette, Elberta Fortis, MD  rizatriptan (MAXALT) 10 MG tablet TAKE ONE TABLET BY MOUTH AT ONSET OF HEADACHE; MAY REPEAT ONE TABLET IN 2 HOURS IF NEEDED. 02/02/22   Nelwyn Salisbury, MD      Allergies    Alprazolam, Bee venom, and Tetanus toxoids    Review of Systems   Review of Systems  Gastrointestinal:  Positive for abdominal pain.    Physical Exam Updated Vital Signs BP 118/70   Pulse 70   Temp 98 F (36.7 C) (Oral)   Resp 15   Wt 65.8 kg   LMP 05/02/2022 (Exact Date)   SpO2  99%   BMI 25.69 kg/m  Physical Exam Vitals and nursing note reviewed.  Constitutional:      General: She is not in acute distress.    Appearance: She is well-developed.  HENT:     Head: Normocephalic and atraumatic.     Comments: Eyes are slightly sunken    Mouth/Throat:     Mouth: Mucous membranes are dry.  Eyes:     Pupils: Pupils are equal, round, and reactive to light.  Cardiovascular:     Rate and Rhythm: Normal rate and regular rhythm.     Heart sounds: Normal heart sounds. No murmur heard.    No friction rub.  Pulmonary:     Effort: Pulmonary effort is normal.     Breath sounds: Normal breath sounds. No wheezing or rales.  Abdominal:     General: Bowel sounds are normal. There is no distension.     Palpations: Abdomen is soft.     Tenderness: There is abdominal tenderness in the suprapubic area and left lower quadrant. There is no right CVA tenderness, left CVA tenderness, guarding or rebound.  Musculoskeletal:        General: No tenderness. Normal range of motion.     Comments: No edema  Skin:    General: Skin is warm and dry.     Coloration: Skin is pale.     Findings: No rash.  Neurological:     Mental Status: She is alert and oriented to person, place, and time. Mental status is at baseline.     Cranial Nerves: No cranial nerve deficit.  Psychiatric:        Behavior: Behavior normal.     ED Results / Procedures / Treatments   Labs (all labs ordered are listed, but only abnormal results are displayed) Labs Reviewed  CBC WITH DIFFERENTIAL/PLATELET - Abnormal; Notable for the following components:      Result Value   Hemoglobin 11.9 (*)    HCT 35.8 (*)    Lymphs Abs 4.3 (*)    All other components within normal limits  COMPREHENSIVE METABOLIC PANEL  URINALYSIS, ROUTINE W REFLEX MICROSCOPIC  PREGNANCY, URINE    EKG None  Radiology CT ABDOMEN PELVIS W CONTRAST  Result Date: 05/26/2022 CLINICAL DATA:  LEFT lower quadrant abdominal pain, nausea,  diarrhea, loss of appetite, periumbilical pain, diagnosed as diverticulitis yesterday at urgent care center, worsened pain today, history of anemia EXAM: CT ABDOMEN AND PELVIS WITH CONTRAST TECHNIQUE: Multidetector CT imaging of the abdomen and pelvis was performed using the standard protocol following bolus administration of intravenous contrast. RADIATION DOSE REDUCTION: This exam was performed according to the departmental dose-optimization program which includes automated exposure control, adjustment of the mA and/or kV according to patient size and/or use of iterative reconstruction technique. CONTRAST:  28mL  OMNIPAQUE IOHEXOL 300 MG/ML  SOLN IV COMPARISON:  04/09/2019 FINDINGS: Lower chest: Small collection of fluid at medial RIGHT lung base 21 x 16 mm likely representing pericardial sleeve recess inferior to the RIGHT inferior pulmonary vein, better seen than on previous exam. Lung bases otherwise clear. Hepatobiliary: Focal fatty infiltration of liver adjacent to falciform fissure. Gallbladder and liver otherwise normal appearance Pancreas: Normal appearance Spleen: Normal appearance Adrenals/Urinary Tract: Adrenal glands normal appearance. Kidneys, ureters, and bladder normal appearance. Stomach/Bowel: Normal appendix. Stomach and bowel loops normal appearance. No colonic diverticula or evidence of diverticulitis. Vascular/Lymphatic: Aorta normal caliber. Vascular structures patent. No adenopathy. Reproductive: Uterus and RIGHT ovary normal appearance. Large low-attenuation cystic lesion LEFT ovary 4.8 x 4.8 x 5.0 cm consistent with simple appearing ovarian cyst. Fluid is seen tracking posteriorly from the ovarian cyst into the cul-de-sac question ruptured cyst. Other: Small amount of free fluid as above. No free air. No hernia. Musculoskeletal: Osseous structures unremarkable. IMPRESSION: Large simple appearing LEFT ovarian cyst 4.8 x 4.8 x 5.0 cm with fluid tracking posteriorly from the ovarian cyst into  the cul-de-sac question ruptured cyst; no follow-up imaging recommended. No other intra-abdominal or intrapelvic abnormalities. Electronically Signed   By: Ulyses Southward M.D.   On: 05/26/2022 09:25    Procedures Procedures    Medications Ordered in ED Medications  ketorolac (TORADOL) 15 MG/ML injection 15 mg (has no administration in time range)  morphine (PF) 4 MG/ML injection 4 mg (4 mg Intravenous Given 05/26/22 0742)  ondansetron (ZOFRAN) injection 4 mg (4 mg Intravenous Given 05/26/22 0742)  lactated ringers bolus 1,000 mL (0 mLs Intravenous Stopped 05/26/22 0912)  iohexol (OMNIPAQUE) 300 MG/ML solution 100 mL (80 mLs Intravenous Contrast Given 05/26/22 5093)    ED Course/ Medical Decision Making/ A&P                           Medical Decision Making Amount and/or Complexity of Data Reviewed External Data Reviewed: notes.    Details: Urgent care Labs: ordered. Decision-making details documented in ED Course. Radiology: ordered and independent interpretation performed. Decision-making details documented in ED Course.  Risk Prescription drug management.   Pt presenting today with a complaint that caries a high risk for morbidity and mortality.  Here today with complaint of abdominal pain, recurrent diarrhea and poor appetite.  Patient has lower abdominal pain more significant in the left lower quadrant.  She denies any specific urinary symptoms.  No flank pain.  Concern for diverticulitis versus colitis versus foodborne illness versus ovarian pathology versus atypical appendicitis.  Lower suspicion for pregnancy as patient has had routine menses and denies being sexually active at this time.  Lower suspicion for TOA, pyelonephritis, renal stone.  Patient is up to her abdomen is nontender and low suspicion for pancreatitis, cholecystitis or hepatitis.  Patient given IV fluids, pain control.  Labs and imaging are pending.  10:12 AM I have independently visualized and interpreted pt's  images today. CT without evidence of diverticulitis, hydronephrosis or renal stones today.  Radiology reports large ovarian cyst with possible mild rupture but no other acute intra-abdominal process.  This would explain the patient's pain.  I independently interpreted patient's labs today and CBC, CMP, UA and UPT are all within normal limits.  Findings discussed with the patient.  Pain is improved after medications.  She was given Toradol and was discharged home to use Tylenol, ibuprofen but was given a short course of pain medication to  use as needed.  Also encouraged follow-up with her OB/GYN.        Final Clinical Impression(s) / ED Diagnoses Final diagnoses:  Left ovarian cyst    Rx / DC Orders ED Discharge Orders          Ordered    HYDROcodone-acetaminophen (NORCO/VICODIN) 5-325 MG tablet  Every 6 hours PRN        05/26/22 1011              Gwyneth Sprout, MD 05/26/22 1012

## 2022-05-26 NOTE — ED Notes (Signed)
Patient transported to CT 

## 2022-05-27 ENCOUNTER — Telehealth: Payer: Self-pay

## 2022-05-27 NOTE — Telephone Encounter (Signed)
Transition Care Management Unsuccessful Follow-up Telephone Call  Date of discharge and from where:  05/26/22 from [facility] ED for Unspecified ovarian cyst, left side  Attempts:  1st Attempt  Reason for unsuccessful TCM follow-up call:  Left voice message LVM instructions for pt to return call & ask for [facility]

## 2022-06-04 ENCOUNTER — Telehealth: Payer: Self-pay

## 2022-06-04 ENCOUNTER — Emergency Department (HOSPITAL_BASED_OUTPATIENT_CLINIC_OR_DEPARTMENT_OTHER)
Admission: EM | Admit: 2022-06-04 | Discharge: 2022-06-04 | Discharge status: Against Medical Advice | Payer: Medicaid Other

## 2022-06-04 NOTE — Telephone Encounter (Signed)
Transition Care Management Unsuccessful Follow-up Telephone Call  Date of discharge and from where:  [facility] on 06/04/22 from [facility]  Attempts:  1st Attempt  Reason for unsuccessful TCM follow-up call:  Left voice message

## 2022-06-07 NOTE — Telephone Encounter (Signed)
Transition Care Management Follow-up Telephone Call Date of discharge and from where: [facility] on 06/04/22 from [facility] ED  How have you been since you were released from the hospital? Pt states she has an ovarian cyst. Has a lot of bleeding, was told that the cyst ruptured on  Any questions or concerns? No    Follow up appointments reviewed:  PCP Hospital f/u appt confirmed? No  Pt states "it's nothing Dr Clent Ridges can help me with." Specialist Hospital f/u appt confirmed? No  Pt was offered 06/28/22 appt with GYN, but declined it b/c she believes the issue will be resolved by then. Pt encouraged to make/keep appt with GYN, but declines.  If their condition worsens, is the pt aware to call PCP or go to the Emergency Dept.? Yes Was the patient provided with contact information for the PCP's office or ED? Yes Was to pt encouraged to call back with questions or concerns? Yes

## 2022-08-23 ENCOUNTER — Encounter: Payer: Self-pay | Admitting: Neurology

## 2022-08-29 ENCOUNTER — Other Ambulatory Visit: Payer: Self-pay | Admitting: Family Medicine

## 2022-08-29 DIAGNOSIS — G43009 Migraine without aura, not intractable, without status migrainosus: Secondary | ICD-10-CM

## 2022-09-13 ENCOUNTER — Other Ambulatory Visit: Payer: Self-pay | Admitting: Family Medicine
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
## 2022-09-22 ENCOUNTER — Ambulatory Visit: Payer: Medicaid Other | Admitting: Neurology

## 2022-09-22 ENCOUNTER — Encounter: Payer: Self-pay | Admitting: Neurology

## 2022-09-22 VITALS — BP 120/82 | HR 89 | Ht 63.0 in | Wt 147.4 lb

## 2022-09-22 DIAGNOSIS — R519 Headache, unspecified: Secondary | ICD-10-CM

## 2022-09-22 DIAGNOSIS — S0990XS Unspecified injury of head, sequela: Secondary | ICD-10-CM

## 2022-09-22 DIAGNOSIS — R202 Paresthesia of skin: Secondary | ICD-10-CM

## 2022-09-22 DIAGNOSIS — G8929 Other chronic pain: Secondary | ICD-10-CM

## 2022-09-22 DIAGNOSIS — R200 Anesthesia of skin: Secondary | ICD-10-CM

## 2022-09-22 DIAGNOSIS — M5442 Lumbago with sciatica, left side: Secondary | ICD-10-CM | POA: Diagnosis not present

## 2022-09-22 MED ORDER — METHYLPREDNISOLONE 4 MG PO TBPK: ORAL_TABLET | ORAL | 0 refills | Status: DC

## 2022-09-22 NOTE — Patient Instructions (Signed)
I would like to investigate your symptoms further with the following: -Blood work -EMG (muscle and nerve test) to look for nerve damage  I will send a prescription for a medrol dose pack to your pharmacy for your headache as this can help with severe headache. Concussion symptoms can take time and require rest to improve.  I will be in touch when I have your results to discuss next steps.  Please let me know if you have any questions or concerns in the meantime.   The physicians and staff at [facility] are committed to providing excellent care. You may receive a survey requesting feedback about your experience at our office. We strive to receive "very good" responses to the survey questions. If you feel that your experience would prevent you from giving the office a "very good " response, please contact our office to try to remedy the situation. We may be reached at [phone]. Thank you for taking the time out of your busy day to complete the survey.  Kai Levins, MD [facility]

## 2022-10-06 ENCOUNTER — Ambulatory Visit: Payer: Medicaid Other | Admitting: Family Medicine

## 2022-10-19 ENCOUNTER — Ambulatory Visit: Payer: Medicaid Other | Admitting: Physician Assistant

## 2022-10-19 ENCOUNTER — Ambulatory Visit: Payer: Medicaid Other | Admitting: Family Medicine

## 2022-10-28 ENCOUNTER — Ambulatory Visit
Admission: RE | Admit: 2022-10-28 | Discharge: 2022-10-28 | Disposition: A | Discharge status: Home / Self Care | Payer: Medicaid Other | Source: Ambulatory Visit | Attending: Family Medicine | Admitting: Family Medicine

## 2022-10-28 VITALS — BP 131/96 | HR 96 | Temp 98.6°F | Resp 16 | Ht 63.0 in | Wt 138.0 lb

## 2022-10-28 DIAGNOSIS — A6004 Herpesviral vulvovaginitis: Secondary | ICD-10-CM

## 2022-10-28 MED ORDER — VALACYCLOVIR HCL 1 G PO TABS: 1000.0000 mg | ORAL_TABLET | Freq: Three times a day (TID) | ORAL | 0 refills | Status: DC

## 2022-10-28 NOTE — Discharge Instructions (Signed)
Take the valacyclovir 3 x a day for 7 days This is an antiviral medicine Condoms recommended during sex Test result will be available in my chart

## 2022-10-28 NOTE — ED Triage Notes (Signed)
Patient c/o possible herpes in the vaginal area x 2-3 days.  Patient did knick herself while shaving on Tuesday.  Noticed red lesions on labia 2-3 days ago.  The area is extremely painful.  Denies any vaginal discharge or odor.

## 2022-10-28 NOTE — ED Provider Notes (Signed)
[name]    CSN: [id number] Arrival date & time: 10/28/22  1250      History   Chief Complaint Chief Complaint  Patient presents with   Exposure to STD    Entered by patient    HPI Alexandra Meadows is a 27 y.o. female.   HPI This is a pleasant 27 year old female.  On birth control pills.  Has 2 children.  She has a monogamous relationship with a boyfriend for the last 6 months.  She noticed a couple of bumps 2 days ago.  It was the day after she had shaved.  She did not think anything of it but today she has noticed there is more of them, and it is very painful.  She is worried it may be herpes.  She has never had a herpes outbreak before.  Discussed with patient she should have a full STD panel workup.  She refuses.  She just wants herpes testing.   Past Medical History:  Diagnosis Date   Anemia    Anxiety    Asthma    prn inhaler; exercise induced   Carpal tunnel syndrome    Depression    severe at 20weeks   History of seizures    states caused by Xanax - no seizures since 2015   Infection    BV with preg; recurrent yeast inf   Migraines    MRSA (methicillin resistant staph aureus) culture positive    Seasonal allergies    Tonsillar hypertrophy 04/2016    Patient Active Problem List   Diagnosis Date Noted   Low back pain 09/21/2021   Vertigo 08/07/2020   Environmental and seasonal allergies 03/10/2020   Chronic bilateral thoracic back pain 08/25/2018   Vaginal bleeding 01/21/2018   SVD (spontaneous vaginal delivery) 12/27/2017   Uterine size date discrepancy pregnancy, third trimester    Umbilical vein abnormality affecting pregnancy    Vitamin D deficiency 07/05/2017   Supervision of high-risk pregnancy 06/30/2017   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   Anxiety and depression 02/17/2014   INSOMNIA 03/20/2008   ADHD 08/29/2007   Migraine without aura 03/29/2007    Past Surgical History:  Procedure Laterality Date   CARPAL TUNNEL RELEASE  Bilateral    2022   KNEE ARTHROSCOPY Left 02/28/2014   Procedure: ARTHROSCOPY LEFT KNEE WITH SYNOVECTOMY LIMITED, PARTIAL LATERAL MENISECTOMY;  Surgeon: Ninetta Lights, MD;  Location: [place];  Service: Orthopedics;  Laterality: Left;   TONSILLECTOMY AND ADENOIDECTOMY Bilateral 05/25/2016   Procedure: TONSILLECTOMY AND ADENOIDECTOMY;  Surgeon: Leta Baptist, MD;  Location: [place];  Service: ENT;  Laterality: Bilateral;   WISDOM TOOTH EXTRACTION  2015    OB History     Gravida  2   Para  2   Term  2   Preterm      AB      Living  2      SAB      IAB      Ectopic      Multiple  0   Live Births  2            Home Medications    Prior to Admission medications   Medication Sig Start Date End Date Taking? Authorizing Provider  albuterol (VENTOLIN HFA) 108 (90 Base) MCG/ACT inhaler Inhale 2 puffs into the lungs every 6 (six) hours as needed for wheezing or shortness of breath. 01/04/22  Yes Burchette, Alinda Sierras, MD  cyclobenzaprine (FLEXERIL) 10 MG tablet  TAKE ONE TABLET BY MOUTH THREE TIMES A DAY AS NEEDED FOR MUSCLE SPASMS 05/18/22  Yes Laurey Morale, MD  [name] 3-0.02 MG tablet TAKE 1 TABLET BY MOUTH DAILY 09/16/22  Yes Laurey Morale, MD  rizatriptan (MAXALT) 10 MG tablet TAKE ONE TABLET BY MOUTH AT ONSET OF HEADACHE; MAY REPEAT ONE TABLET IN 2 HOURS IF NEEDED. 09/01/22  Yes Laurey Morale, MD  valACYclovir (VALTREX) 1000 MG tablet Take 1 tablet (1,000 mg total) by mouth 3 (three) times daily. 10/28/22  Yes Raylene Everts, MD  escitalopram (LEXAPRO) 10 MG tablet Take 1 tablet (10 mg total) by mouth daily. 05/18/22   Laurey Morale, MD    Family History Family History  Problem Relation Age of Onset   Diabetes Mother    Cancer Maternal Grandfather     Social History Social History   Tobacco Use   Smoking status: Never   Smokeless tobacco: Never   Tobacco comments:    quit  Vaping Use   Vaping Use: Former  Substance Use  Topics   Alcohol use: Yes    Alcohol/week: 0.0 standard drinks of alcohol    Comment: occ   Drug use: No     Allergies   Alprazolam, Bee venom, and Tetanus toxoids   Review of Systems Review of Systems See HPI  Physical Exam Triage Vital Signs ED Triage Vitals  Enc Vitals Group     BP 10/28/22 1301 (!) 131/96     Pulse Rate 10/28/22 1301 96     Resp 10/28/22 1301 16     Temp 10/28/22 1301 98.6 F (37 C)     Temp Source 10/28/22 1301 Oral     SpO2 10/28/22 1301 99 %     Weight 10/28/22 1303 138 lb (62.6 kg)     Height 10/28/22 1303 5\' 3"  (1.6 m)     Head Circumference --      Peak Flow --      Pain Score 10/28/22 1302 3     Pain Loc --      Pain Edu? --      Excl. in [place]? --    No data found.  Updated Vital Signs BP (!) 131/96 (BP Location: Left Arm)   Pulse 96   Temp 98.6 F (37 C) (Oral)   Resp 16   Ht 5\' 3"  (1.6 m)   Wt 62.6 kg   SpO2 99%   BMI 24.45 kg/m      Physical Exam Exam conducted with a chaperone present.  Constitutional:      General: She is not in acute distress.    Appearance: She is well-developed.     Comments: Upset.  Anxious.  Tearful  HENT:     Head: Normocephalic and atraumatic.  Eyes:     Conjunctiva/sclera: Conjunctivae normal.     Pupils: Pupils are equal, round, and reactive to light.  Cardiovascular:     Rate and Rhythm: Normal rate.  Pulmonary:     Effort: Pulmonary effort is normal. No respiratory distress.  Abdominal:     General: There is no distension.     Palpations: Abdomen is soft.  Genitourinary:    Exam position: Lithotomy position.     Pubic Area: Rash present.     Labia:        Right: Rash present.        Left: No rash.        Comments: Cluster of vesicles that extend down the  right labia to the posterior fourchette.  A couple are blister still intact, others are small circular ulcers.  Very tender.  No vaginal discharge. Musculoskeletal:        General: Normal range of motion.     Cervical back:  Normal range of motion.  Skin:    General: Skin is warm and dry.  Neurological:     Mental Status: She is alert.  Psychiatric:     Comments: Very upset and tearful      UC Treatments / Results  Labs (all labs ordered are listed, but only abnormal results are displayed) Labs Reviewed  HSV CULTURE AND TYPING    EKG   Radiology No results found.  Procedures Procedures (including critical care time)  Medications Ordered in UC Medications - No data to display  Initial Impression / Assessment and Plan / UC Course  I have reviewed the triage vital signs and the nursing notes.  Pertinent labs & imaging results that were available during my care of the patient were reviewed by me and considered in my medical decision making (see chart for details).     Discussed with patient that this is a very typical appearance for herpes.  We discussed the infection.  The importance of condoms.  Treatment with valacyclovir.  She is given written information.  Is encouraged to call for questions. Final Clinical Impressions(s) / UC Diagnoses   Final diagnoses:  Herpes simplex vulvovaginitis     Discharge Instructions      Take the valacyclovir 3 x a day for 7 days This is an antiviral medicine Condoms recommended during sex Test result will be available in my chart   ED Prescriptions     Medication Sig Dispense Auth. Provider   valACYclovir (VALTREX) 1000 MG tablet Take 1 tablet (1,000 mg total) by mouth 3 (three) times daily. 21 tablet Raylene Everts, MD      PDMP not reviewed this encounter.   Raylene Everts, MD 10/28/22 1357

## 2022-10-29 ENCOUNTER — Ambulatory Visit: Payer: Medicaid Other | Admitting: Family Medicine

## 2022-10-30 ENCOUNTER — Telehealth: Payer: Self-pay

## 2022-10-30 NOTE — Telephone Encounter (Signed)
Pt called for HSV results. Advised still pending and cytology lab is closed on weekends. Check Mon eve or Tues am, will call if any thing is pos.

## 2022-10-31 LAB — HSV CULTURE AND TYPING

## 2022-11-03 ENCOUNTER — Telehealth: Payer: Self-pay | Admitting: Family Medicine

## 2022-11-03 NOTE — Telephone Encounter (Signed)
Pt requesting a referral to an ob gyn that takes [facility]

## 2022-11-03 NOTE — Telephone Encounter (Signed)
Recommend she call her case worker- they should be able to tell her which local GYN offices take medicaid.

## 2022-11-11 ENCOUNTER — Emergency Department (HOSPITAL_BASED_OUTPATIENT_CLINIC_OR_DEPARTMENT_OTHER)
Admission: EM | Admit: 2022-11-11 | Discharge: 2022-11-11 | Disposition: A | Discharge status: Home / Self Care | Payer: Medicaid Other | Attending: Emergency Medicine | Admitting: Emergency Medicine

## 2022-11-11 ENCOUNTER — Encounter (HOSPITAL_BASED_OUTPATIENT_CLINIC_OR_DEPARTMENT_OTHER): Payer: Self-pay

## 2022-11-11 ENCOUNTER — Other Ambulatory Visit: Payer: Self-pay

## 2022-11-11 ENCOUNTER — Other Ambulatory Visit: Payer: Self-pay | Admitting: Family Medicine

## 2022-11-11 DIAGNOSIS — S86111A Strain of other muscle(s) and tendon(s) of posterior muscle group at lower leg level, right leg, initial encounter: Secondary | ICD-10-CM

## 2022-11-11 DIAGNOSIS — R000 Tachycardia, unspecified: Secondary | ICD-10-CM | POA: Diagnosis not present

## 2022-11-11 DIAGNOSIS — S81811A Laceration without foreign body, right lower leg, initial encounter: Secondary | ICD-10-CM | POA: Diagnosis not present

## 2022-11-11 DIAGNOSIS — S8991XA Unspecified injury of right lower leg, initial encounter: Secondary | ICD-10-CM | POA: Diagnosis present

## 2022-11-11 DIAGNOSIS — W268XXA Contact with other sharp object(s), not elsewhere classified, initial encounter: Secondary | ICD-10-CM | POA: Diagnosis not present

## 2022-11-11 MED ORDER — OXYCODONE-ACETAMINOPHEN 5-325 MG PO TABS
1.0000 | ORAL_TABLET | Freq: Once | ORAL | Status: AC
  Administered 2022-11-11: 1 via ORAL
  Filled 2022-11-11: qty 1

## 2022-11-11 MED ORDER — CYCLOBENZAPRINE HCL 10 MG PO TABS: 10.0000 mg | ORAL_TABLET | Freq: Two times a day (BID) | ORAL | 0 refills | Status: DC | PRN

## 2022-11-11 MED ORDER — LIDOCAINE-EPINEPHRINE (PF) 2 %-1:200000 IJ SOLN
10.0000 mL | Freq: Once | INTRAMUSCULAR | Status: AC
  Administered 2022-11-11: 10 mL via INTRADERMAL
  Filled 2022-11-11: qty 20

## 2022-11-11 MED ORDER — IBUPROFEN 600 MG PO TABS: 600.0000 mg | ORAL_TABLET | Freq: Four times a day (QID) | ORAL | 0 refills | Status: DC | PRN
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
## 2022-11-11 NOTE — Discharge Instructions (Signed)
You have been evaluated for your laceration.  It appears you also suffered a partial tear of your calf muscle.  Try your best to avoid extending your calf for the next 2 weeks to allow for this muscle to heal.  Your sutures will need to be removed in 7 days.  Monitor for any signs of infection.  You may use crutches as needed.  You may follow-up with orthopedist for further care.
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
## 2022-11-11 NOTE — ED Notes (Signed)
States allergy to tetanus

## 2022-11-12 ENCOUNTER — Ambulatory Visit: Payer: Medicaid Other | Admitting: Adult Health

## 2022-11-12 ENCOUNTER — Encounter: Payer: Self-pay | Admitting: Adult Health

## 2022-11-12 VITALS — BP 120/60 | HR 126 | Temp 97.8°F

## 2022-11-12 DIAGNOSIS — S81811A Laceration without foreign body, right lower leg, initial encounter: Secondary | ICD-10-CM | POA: Diagnosis not present
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
## 2022-11-15 ENCOUNTER — Telehealth: Payer: Self-pay | Admitting: Family Medicine

## 2022-11-15 ENCOUNTER — Other Ambulatory Visit: Payer: Self-pay | Admitting: Family Medicine

## 2022-11-15 NOTE — Telephone Encounter (Signed)
Patient dropped off document  Return to Work Form , to be filled out by provider. Patient requested to Call Patient to pick up within 5-days. Document is located in providers tray at front office.Please advise at Mobile [phone] (mobile)   Pt stated first form was given to [facility] so I will be adding his team to this encounter as well.

## 2022-11-16 NOTE — Telephone Encounter (Signed)
I understand. Alexandra Meadows would be the one to fill out the form, thanks

## 2022-11-17 ENCOUNTER — Telehealth: Payer: Self-pay | Admitting: Adult Health

## 2022-11-17 NOTE — Telephone Encounter (Signed)
Pt form was received and handed to [facility] this morning

## 2022-11-17 NOTE — Telephone Encounter (Signed)
Form filled out. Called pt but unable to hear and the phone was disconnected.

## 2022-11-17 NOTE — Telephone Encounter (Signed)
Pt notified form is ready for pickup. Form placed in front office filing cabinet.

## 2022-11-17 NOTE — Telephone Encounter (Signed)
Pt is requesting for form for work to be fixed, she said the dates on the form were incorrect. She is requesting that her back to work date for full duty to be put on 12/17/22. She also will need the form stamped. Form was given to CMA.

## 2022-11-18 NOTE — Telephone Encounter (Signed)
Noted  

## 2022-11-18 NOTE — Telephone Encounter (Signed)
Patient notified of update  and verbalized understanding. Ppw placed in front office filing cabinet.

## 2022-11-22 ENCOUNTER — Ambulatory Visit: Payer: Medicaid Other | Admitting: Family Medicine

## 2022-11-22 ENCOUNTER — Encounter: Payer: Self-pay | Admitting: Family Medicine

## 2022-11-22 VITALS — BP 110/80 | HR 103 | Temp 98.0°F | Wt 137.0 lb

## 2022-11-22 DIAGNOSIS — F419 Anxiety disorder, unspecified: Secondary | ICD-10-CM | POA: Diagnosis not present

## 2022-11-22 DIAGNOSIS — A6004 Herpesviral vulvovaginitis: Secondary | ICD-10-CM | POA: Diagnosis not present

## 2022-11-22 DIAGNOSIS — F900 Attention-deficit hyperactivity disorder, predominantly inattentive type: Secondary | ICD-10-CM | POA: Diagnosis not present

## 2022-11-22 DIAGNOSIS — F32A Depression, unspecified: Secondary | ICD-10-CM

## 2022-11-22 DIAGNOSIS — S81811D Laceration without foreign body, right lower leg, subsequent encounter: Secondary | ICD-10-CM | POA: Diagnosis not present

## 2022-11-22 MED ORDER — BUPROPION HCL ER (XL) 150 MG PO TB24: 150.0000 mg | ORAL_TABLET | Freq: Every day | ORAL | 2 refills | Status: DC

## 2022-11-22 MED ORDER — VALACYCLOVIR HCL 500 MG PO TABS: 500.0000 mg | ORAL_TABLET | Freq: Every day | ORAL | 3 refills | Status: DC

## 2022-11-22 MED ORDER — AMPHETAMINE-DEXTROAMPHET ER 10 MG PO CP24: 10.0000 mg | ORAL_CAPSULE | Freq: Every day | ORAL | 0 refills | Status: DC
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
## 2022-11-23 DIAGNOSIS — A6000 Herpesviral infection of urogenital system, unspecified: Secondary | ICD-10-CM | POA: Insufficient documentation

## 2022-11-26 ENCOUNTER — Telehealth (INDEPENDENT_AMBULATORY_CARE_PROVIDER_SITE_OTHER): Payer: Medicaid Other | Admitting: Family Medicine

## 2022-11-26 ENCOUNTER — Encounter: Payer: Self-pay | Admitting: Family Medicine

## 2022-11-26 DIAGNOSIS — J019 Acute sinusitis, unspecified: Secondary | ICD-10-CM

## 2022-11-26 DIAGNOSIS — S81811D Laceration without foreign body, right lower leg, subsequent encounter: Secondary | ICD-10-CM

## 2022-11-26 DIAGNOSIS — G43009 Migraine without aura, not intractable, without status migrainosus: Secondary | ICD-10-CM

## 2022-11-26 MED ORDER — AIMOVIG 70 MG/ML ~~LOC~~ SOAJ: 1.0000 | SUBCUTANEOUS | 11 refills | Status: DC

## 2022-11-26 MED ORDER — AZITHROMYCIN 250 MG PO TABS: ORAL_TABLET | ORAL | 0 refills | Status: DC

## 2022-11-26 NOTE — Progress Notes (Signed)
Subjective:    Patient ID: Alexandra Meadows, female    DOB: [birth year], 27 y.o.   MRN: [id number]  HPI Virtual Visit via Video Note  I connected with the patient on 11/26/22 at  2:15 PM EDT by a video enabled telemedicine application and verified that I am speaking with the correct person using two identifiers.  Location patient: home Location provider:work or home office Persons participating in the virtual visit: patient, provider  I discussed the limitations of evaluation and management by telemedicine and the availability of in person appointments. The patient expressed understanding and agreed to proceed.   HPI: Here for several issues. First she continues to have a lot of pain in her right calf after a deep laceration on 11-11-22. At the ED sutures were placed to close this, and these were removed 7 days later. Since then the wound continues to be very painful, though it looks clean and it is not swollen or red or warm. Second, she thinks she has a sinus infection. For the past 3 days she has had a headache, facial pressure, PND, and a dry cough. No fever. Third she has frequent migraine headaches, and she wants to try a different preventive agent.    ROS: See pertinent positives and negatives per HPI.  Past Medical History:  Diagnosis Date   Anemia    Anxiety    Asthma    prn inhaler; exercise induced   Carpal tunnel syndrome    Depression    severe at 20weeks   History of seizures    states caused by Xanax - no seizures since 2015   Infection    BV with preg; recurrent yeast inf   Migraines    MRSA (methicillin resistant staph aureus) culture positive    Seasonal allergies    Tonsillar hypertrophy 04/2016    Past Surgical History:  Procedure Laterality Date   CARPAL TUNNEL RELEASE Bilateral    2022   KNEE ARTHROSCOPY Left 02/28/2014   Procedure: ARTHROSCOPY LEFT KNEE WITH SYNOVECTOMY LIMITED, PARTIAL LATERAL MENISECTOMY;  Surgeon: Loreta Ave, MD;  Location:  [facility];  Service: Orthopedics;  Laterality: Left;   TONSILLECTOMY AND ADENOIDECTOMY Bilateral 05/25/2016   Procedure: TONSILLECTOMY AND ADENOIDECTOMY;  Surgeon: Newman Pies, MD;  Location: [facility];  Service: ENT;  Laterality: Bilateral;   WISDOM TOOTH EXTRACTION  2015    Family History  Problem Relation Age of Onset   Diabetes Mother    Cancer Maternal Grandfather      Current Outpatient Medications:    albuterol (VENTOLIN HFA) 108 (90 Base) MCG/ACT inhaler, Inhale 2 puffs into the lungs every 6 (six) hours as needed for wheezing or shortness of breath., Disp: 8 g, Rfl: 0   amphetamine-dextroamphetamine (ADDERALL XR) 10 MG 24 hr capsule, Take 1 capsule (10 mg total) by mouth daily., Disp: 30 capsule, Rfl: 0   buPROPion (WELLBUTRIN XL) 150 MG 24 hr tablet, Take 1 tablet (150 mg total) by mouth daily., Disp: 30 tablet, Rfl: 2   cyclobenzaprine (FLEXERIL) 10 MG tablet, Take 1 tablet (10 mg total) by mouth 2 (two) times daily as needed for muscle spasms., Disp: 20 tablet, Rfl: 0   ibuprofen (ADVIL) 600 MG tablet, Take 1 tablet (600 mg total) by mouth every 6 (six) hours as needed., Disp: 30 tablet, Rfl: 0   [name] 3-0.02 MG tablet, TAKE 1 TABLET BY MOUTH DAILY, Disp: 28 tablet, Rfl: 1   rizatriptan (MAXALT) 10 MG tablet, TAKE ONE TABLET  BY MOUTH AT ONSET OF HEADACHE; MAY REPEAT ONE TABLET IN 2 HOURS IF NEEDED., Disp: 10 tablet, Rfl: 1   valACYclovir (VALTREX) 500 MG tablet, Take 1 tablet (500 mg total) by mouth daily., Disp: 90 tablet, Rfl: 3  EXAM:  VITALS per patient if applicable:  GENERAL: alert, oriented, appears well and in no acute distress  HEENT: atraumatic, conjunttiva clear, no obvious abnormalities on inspection of external nose and ears  NECK: normal movements of the head and neck  LUNGS: on inspection no signs of respiratory distress, breathing rate appears normal, no obvious gross SOB, gasping or wheezing  CV: no obvious cyanosis  MS:  moves all visible extremities without noticeable abnormality  PSYCH/NEURO: pleasant and cooperative, no obvious depression or anxiety, speech and thought processing grossly intact  ASSESSMENT AND PLAN: For the laceration, she will go to the [facility] urgent [facility] orthopedic clinic tomorrow. For the sinusitis, we will treat this with a Zpack. For the migraines, she will try Aimovig 70 mg monthly.  Gershon Crane, MD  Discussed the following assessment and plan:  No diagnosis found.     I discussed the assessment and treatment plan with the patient. The patient was provided an opportunity to ask questions and all were answered. The patient agreed with the plan and demonstrated an understanding of the instructions.   The patient was advised to call back or seek an in-person evaluation if the symptoms worsen or if the condition fails to improve as anticipated.      Review of Systems     Objective:   Physical Exam        Assessment & Plan:

## 2022-11-27 ENCOUNTER — Other Ambulatory Visit: Payer: Self-pay | Admitting: Family Medicine

## 2022-11-27 DIAGNOSIS — G43009 Migraine without aura, not intractable, without status migrainosus: Secondary | ICD-10-CM

## 2022-11-29 ENCOUNTER — Ambulatory Visit: Payer: Self-pay

## 2022-11-30 ENCOUNTER — Ambulatory Visit: Payer: Medicaid Other | Admitting: Family Medicine

## 2022-11-30 ENCOUNTER — Encounter: Payer: Self-pay | Admitting: Family Medicine

## 2022-11-30 VITALS — BP 110/62 | HR 119 | Temp 98.4°F | Wt 139.8 lb

## 2022-11-30 DIAGNOSIS — F419 Anxiety disorder, unspecified: Secondary | ICD-10-CM

## 2022-11-30 DIAGNOSIS — S81819A Laceration without foreign body, unspecified lower leg, initial encounter: Secondary | ICD-10-CM | POA: Insufficient documentation

## 2022-11-30 DIAGNOSIS — F900 Attention-deficit hyperactivity disorder, predominantly inattentive type: Secondary | ICD-10-CM | POA: Diagnosis not present

## 2022-11-30 DIAGNOSIS — J029 Acute pharyngitis, unspecified: Secondary | ICD-10-CM | POA: Diagnosis not present

## 2022-11-30 DIAGNOSIS — S81811D Laceration without foreign body, right lower leg, subsequent encounter: Secondary | ICD-10-CM

## 2022-11-30 DIAGNOSIS — F32A Depression, unspecified: Secondary | ICD-10-CM | POA: Diagnosis not present

## 2022-11-30 LAB — POCT RAPID STREP A (OFFICE): Rapid Strep A Screen: NEGATIVE

## 2022-11-30 MED ORDER — AMPHETAMINE-DEXTROAMPHETAMINE 20 MG PO TABS: 20.0000 mg | ORAL_TABLET | Freq: Two times a day (BID) | ORAL | 0 refills | Status: DC

## 2022-11-30 NOTE — Progress Notes (Signed)
   Subjective:    Patient ID: Alexandra Meadows, female    DOB: Mar 06, 1996, 27 y.o.   MRN: [id number]  HPI Here for several issues. First she continues to have a ST, although today she feels much better than yesterday. No fever. She was seen virtually on 11-26-22 for a ST, and she was given a Zpack. She is now on day 3 of this. Secondly, she has been trying Adderall XR for her ADHD. She tried taking 10 mg in the morning and then 20 mg in the morning. Each time she felt that it helped her focus, but it took several hours before it became effective. Thirdly she has been taking Wellbutrin XL for her anxiety and depression. She says this has not helped much so far. She sleeps well. Fourthly we had seen her for a deep laceration to the right calf, and suggested she see Orthopedics. She was seen at the [facility] walk in clinic, and they are concerned she may have a major muscle injury. She is being scheduled for an MRI in a few weeks to get a better look. This wound is feeling a little better with less pain.    Review of Systems  Constitutional:  Positive for fatigue. Negative for fever.  HENT:  Positive for sore throat. Negative for congestion, ear pain, postnasal drip and sinus pressure.   Eyes: Negative.   Respiratory: Negative.    Psychiatric/Behavioral:  Positive for decreased concentration and dysphoric mood. Negative for agitation, hallucinations and sleep disturbance. The patient is nervous/anxious.        Objective:   Physical Exam Constitutional:      Appearance: Normal appearance. She is not ill-appearing.  HENT:     Right Ear: Tympanic membrane, ear canal and external ear normal.     Left Ear: Tympanic membrane, ear canal and external ear normal.     Nose: Nose normal.     Mouth/Throat:     Pharynx: Oropharynx is clear.  Eyes:     Conjunctiva/sclera: Conjunctivae normal.  Pulmonary:     Effort: Pulmonary effort is normal.     Breath sounds: Normal breath sounds.   Lymphadenopathy:     Cervical: No cervical adenopathy.  Neurological:     Mental Status: She is alert.  Psychiatric:        Mood and Affect: Mood normal.        Behavior: Behavior normal.        Thought Content: Thought content normal.           Assessment & Plan:  For the ST, this has tested negative for strep but she will finish the Zpack. She seems to be responding to this. For the ADHD, we will stop the Adderall XR and instead try immediate release Adderall 20 mg BID. For the anxiety and depression, she will stay on the Wellbutrin XL. This should reach therapeutic levels in the next wek or two. For the calk injury, she will follow up with orthopedics.  Gershon Crane, MD

## 2022-12-06 ENCOUNTER — Telehealth: Payer: Self-pay | Admitting: Family Medicine

## 2022-12-06 ENCOUNTER — Telehealth: Payer: Self-pay

## 2022-12-06 ENCOUNTER — Other Ambulatory Visit: Payer: Self-pay

## 2022-12-06 NOTE — Telephone Encounter (Signed)
Pt FMLA form was faxed to her Orthopedic Dr Romero Belling

## 2022-12-06 NOTE — Telephone Encounter (Signed)
Pt called to say she was stung by a bee.   Pt states she had trouble breathing after being stung, and the site of the bite is very red and painful.  Pt would like an epi-pen sent to her pharmacy, for future use.  [facility] [id number] - [place], [place] - [address] Phone: [phone]  Fax: [phone]

## 2022-12-06 NOTE — Telephone Encounter (Signed)
FYI Spoke with Alexandra Meadows states that she is ok today and just wants a new Rx for Epi-pen to keep with her for Emergencies. Alexandra Meadows is ok to wait for Dr Clent Ridges to return to the office

## 2022-12-07 MED ORDER — EPINEPHRINE 0.3 MG/0.3ML IJ SOAJ: 0.3000 mg | INTRAMUSCULAR | 2 refills | Status: AC | PRN

## 2022-12-07 NOTE — Telephone Encounter (Signed)
Done

## 2022-12-07 NOTE — Addendum Note (Signed)
Addended by: Gershon Crane A on: 12/07/2022 12:31 PM   Modules accepted: Orders

## 2022-12-09 NOTE — Telephone Encounter (Signed)
Spoke with pt aware that new Rx for epi-pen was sent to her pharmacy, pt is aware that her FMLA form was received but faxed to her Orthopaedic doctor per Dr Clent Ridges

## 2022-12-14 ENCOUNTER — Telehealth: Payer: Self-pay | Admitting: Family Medicine

## 2022-12-14 NOTE — Telephone Encounter (Signed)
Pt states she has been noticing that her blood sugar has been unusually high lately, and she is very concerned.   Pt is wondering if the Adderall she has been taking is causing her blood sugar to spike ?  Please advise.

## 2022-12-14 NOTE — Telephone Encounter (Signed)
Left pt a message to call the office back.

## 2022-12-14 NOTE — Telephone Encounter (Signed)
Pt is returning Alexandra Meadows call °

## 2022-12-15 ENCOUNTER — Telehealth: Payer: Self-pay

## 2022-12-15 NOTE — Telephone Encounter (Signed)
Pt is scheduled to see Dr Clent Ridges tomorrow 12/16/22

## 2022-12-15 NOTE — Telephone Encounter (Signed)
Patient Advocate Encounter  Prior Authorization for Aimovig 70MG /ML has been approved with [employer].    Effective dates: 12/09/22 through 03/09/23

## 2022-12-16 ENCOUNTER — Encounter: Payer: Self-pay | Admitting: Family Medicine

## 2022-12-16 ENCOUNTER — Ambulatory Visit: Payer: Medicaid Other | Admitting: Family Medicine

## 2022-12-16 VITALS — BP 110/64 | HR 103 | Temp 98.7°F | Wt 136.0 lb

## 2022-12-16 DIAGNOSIS — F900 Attention-deficit hyperactivity disorder, predominantly inattentive type: Secondary | ICD-10-CM

## 2022-12-16 DIAGNOSIS — R739 Hyperglycemia, unspecified: Secondary | ICD-10-CM

## 2022-12-16 MED ORDER — AMPHETAMINE-DEXTROAMPHETAMINE 20 MG PO TABS: 20.0000 mg | ORAL_TABLET | Freq: Two times a day (BID) | ORAL | 0 refills | Status: DC

## 2022-12-16 NOTE — Progress Notes (Signed)
   Subjective:    Patient ID: Alexandra Meadows, female    DOB: 25-Apr-1996, 27 y.o.   MRN: [id number]  HPI Here to follow up on her ADHD and with questions about her blood glucose levels. She has been taking immediate release Adderall 20 mg BID, and this has been working very well for her. She wants to stay on this. She has also been checking her blood glucoses because her mother has type 2 diabetes, and she is afraid of developing this. In the past 48 hours she had a reading of 149 two hours after eating a veggie burger and a baked potato. She also had a fasting reading of 111.    Review of Systems  Constitutional: Negative.   Respiratory: Negative.    Cardiovascular: Negative.   Neurological: Negative.        Objective:   Physical Exam Constitutional:      Appearance: Normal appearance.  Cardiovascular:     Rate and Rhythm: Normal rate and regular rhythm.     Pulses: Normal pulses.     Heart sounds: Normal heart sounds.  Pulmonary:     Effort: Pulmonary effort is normal.     Breath sounds: Normal breath sounds.  Neurological:     General: No focal deficit present.     Mental Status: She is alert and oriented to person, place, and time.           Assessment & Plan:  ADHD, now well controlled. We will continue her on the current regimen. She may have some prediabetes, so we will get an A1c. We discussed the importance of her limiting her intake of sugars and carbs.  Gershon Crane, MD

## 2022-12-16 NOTE — Telephone Encounter (Signed)
Pt has been notified.

## 2023-01-01 ENCOUNTER — Encounter (HOSPITAL_COMMUNITY): Payer: Self-pay

## 2023-01-01 ENCOUNTER — Ambulatory Visit (HOSPITAL_COMMUNITY)
Admission: RE | Admit: 2023-01-01 | Discharge: 2023-01-01 | Disposition: A | Discharge status: Home / Self Care | Payer: Medicaid Other | Source: Ambulatory Visit | Attending: Emergency Medicine | Admitting: Emergency Medicine

## 2023-01-01 VITALS — BP 119/74 | HR 117 | Temp 98.3°F | Resp 16 | Ht 63.0 in | Wt 132.0 lb

## 2023-01-01 DIAGNOSIS — R350 Frequency of micturition: Secondary | ICD-10-CM | POA: Insufficient documentation

## 2023-01-01 DIAGNOSIS — R102 Pelvic and perineal pain: Secondary | ICD-10-CM | POA: Insufficient documentation

## 2023-01-01 DIAGNOSIS — J0100 Acute maxillary sinusitis, unspecified: Secondary | ICD-10-CM | POA: Diagnosis not present

## 2023-01-01 DIAGNOSIS — R590 Localized enlarged lymph nodes: Secondary | ICD-10-CM | POA: Diagnosis present

## 2023-01-01 LAB — POCT URINALYSIS DIP (MANUAL ENTRY)
Bilirubin, UA: NEGATIVE
Glucose, UA: NEGATIVE mg/dL
Ketones, POC UA: NEGATIVE mg/dL
Leukocytes, UA: NEGATIVE
Nitrite, UA: NEGATIVE
Protein Ur, POC: NEGATIVE mg/dL
Spec Grav, UA: >=1.03 — AB (ref 1.010–1.025)
Urobilinogen, UA: 0.2 E.U./dL
pH, UA: 5.5 (ref 5.0–8.0)

## 2023-01-01 MED ORDER — FLUCONAZOLE 150 MG PO TABS: 150.0000 mg | ORAL_TABLET | Freq: Every day | ORAL | 0 refills | Status: AC

## 2023-01-01 MED ORDER — AZITHROMYCIN 250 MG PO TABS: 250.0000 mg | ORAL_TABLET | Freq: Every day | ORAL | 0 refills | Status: DC

## 2023-01-01 NOTE — ED Triage Notes (Signed)
Patient here today with c/o urinary frequency and abd pain X 3 weeks. Seems to be worsening. A couple of months ago she had an ovarian cyst. She also has some LB pain. Did home test for UTI and showed positive leukocytes. She has also had some swollen lymph nodes X 6 weeks.   She also thinks that she has a sinus infections. Symptoms include, nasal drainage, green mucous, ST. Has been using Afrin nasal spray which helps.

## 2023-01-01 NOTE — Discharge Instructions (Signed)
As your lymph nodes have been swelling intermittently for 7 days and he has begun to show signs of a sinus infection we will place you on antibiotics to rid body of any germs contributing to illness  Take azithromycin as directed    You can take Tylenol and/or Ibuprofen as needed for fever reduction and pain relief.   For cough: honey 1/2 to 1 teaspoon (you can dilute the honey in water or another fluid).  You can also use guaifenesin and dextromethorphan for cough. You can use a humidifier for chest congestion and cough.  If you don't have a humidifier, you can sit in the bathroom with the hot shower running.      For sore throat: try warm salt water gargles, cepacol lozenges, throat spray, warm tea or water with lemon/honey, popsicles or ice, or OTC cold relief medicine for throat discomfort.   For congestion: take a daily anti-histamine like Zyrtec, Claritin, and a oral decongestant, such as pseudoephedrine.  You can also use Flonase 1-2 sprays in each nostril daily.   It is important to stay hydrated: drink plenty of fluids (water, gatorade/powerade/pedialyte, juices, or teas) to keep your throat moisturized and help further relieve irritation/discomfort.    Exam you are experiencing pain within the pelvic region, may follow-up with your primary doctor to assist checked  Urinalysis is negative for any signs of infection  Vaginal swab checking for bacterial vaginosis and yeast is pending  Today you are being treated prophylactically for yeast.   Take diflucan 150 mg once then after completing all antibiotics take second dose  Please refrain from having sex until labs results, if positive please refrain from having sex until treatment complete and symptoms resolve   If positive for Chlamydia  gonorrhea or trichomoniasis please notify partner or partners so they may tested as well  Moving forward, it is recommended you use some form of protection against the transmission of sti  infections  such as condoms or dental dams with each sexual encounter    In addition:   Avoid baths, hot tubs and whirlpool spas.  Don't use scented or harsh soaps, such as those with deodorant or antibacterial action. Avoid irritants. These include scented tampons and pads. Wipe from front to back after using the toilet.  Don't douche. Your vagina doesn't require cleansing other than normal bathing.  Use a  condom. Wear cotton underwear, this fabric helps absorb moisture

## 2023-01-01 NOTE — ED Provider Notes (Signed)
[facility]    CSN: [id number] Arrival date & time: 01/01/23  1249      History   Chief Complaint Chief Complaint  Patient presents with   Urinary Frequency    HPI Alexandra Meadows is a 27 y.o. female.   Presents for evaluation of intermittent sore throat and intermittent bilateral lymph node swelling for the next 7 weeks, over the last 4 to 5 days has begun to experience nasal congestion, rhinorrhea, nonproductive cough, night sweats, facial pain, bilateral ear pain, chills, body aches and increased fatigue.  Coughing worse at nighttime.  Tolerating food and liquids.  Known sick contacts.  Was evaluated by her PCP during this timeframe, strep testing negative.  Has begun to experience intermittent lower abdominal pelvic pain, worse to the left side present for 3 weeks.  Associated urinary frequency for 3 weeks but now has begun to experience dysuria and itching.  Has not attempted treatment.  Sexually active, no known exposure.  Last menstrual period 12/10/2022.  Denies hematuria, flank pain, fever.  History of an ovarian cyst.      Past Medical History:  Diagnosis Date   Anemia    Anxiety    Asthma    prn inhaler; exercise induced   Carpal tunnel syndrome    Depression    severe at 20weeks   History of seizures    states caused by Xanax - no seizures since 2015   Infection    BV with preg; recurrent yeast inf   Migraines    MRSA (methicillin resistant staph aureus) culture positive    Seasonal allergies    Tonsillar hypertrophy 04/2016    Patient Active Problem List   Diagnosis Date Noted   Laceration of calf 11/30/2022   Genital herpes 11/23/2022   Low back pain 09/21/2021   Vertigo 08/07/2020   Environmental and seasonal allergies 03/10/2020   Chronic bilateral thoracic back pain 08/25/2018   Vaginal bleeding 01/21/2018   SVD (spontaneous vaginal delivery) 12/27/2017   Uterine size date discrepancy pregnancy, third trimester    Umbilical vein  abnormality affecting pregnancy    Vitamin D deficiency 07/05/2017   Supervision of high-risk pregnancy 06/30/2017   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   Anxiety and depression 02/17/2014   INSOMNIA 03/20/2008   ADHD (attention deficit hyperactivity disorder), 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   Migraine without aura 03/29/2007    Past Surgical History:  Procedure Laterality Date   CARPAL TUNNEL RELEASE Bilateral    2022   KNEE ARTHROSCOPY Left 02/28/2014   Procedure: ARTHROSCOPY LEFT KNEE WITH SYNOVECTOMY LIMITED, PARTIAL LATERAL MENISECTOMY;  Surgeon: Loreta Ave, MD;  Location: [facility];  Service: Orthopedics;  Laterality: Left;   TONSILLECTOMY AND ADENOIDECTOMY Bilateral 05/25/2016   Procedure: TONSILLECTOMY AND ADENOIDECTOMY;  Surgeon: Newman Pies, MD;  Location: [facility];  Service: ENT;  Laterality: Bilateral;   WISDOM TOOTH EXTRACTION  2015    OB History     Gravida  2   Para  2   Term  2   Preterm      AB      Living  2      SAB      IAB      Ectopic      Multiple  0   Live Births  2            Home Medications    Prior to Admission medications   Medication Sig Start Date End Date Taking? Authorizing Provider  amphetamine-dextroamphetamine (ADDERALL) 20 MG tablet Take 1 tablet (20 mg total) by mouth 2 (two) times daily. 01/30/23  Yes Nelwyn Salisbury, MD  buPROPion (WELLBUTRIN XL) 150 MG 24 hr tablet Take 1 tablet (150 mg total) by mouth daily. 11/22/22  Yes Nelwyn Salisbury, MD  cyclobenzaprine (FLEXERIL) 10 MG tablet Take 1 tablet (10 mg total) by mouth 2 (two) times daily as needed for muscle spasms. 11/11/22  Yes Fayrene Helper, PA-C  ibuprofen (ADVIL) 600 MG tablet Take 1 tablet (600 mg total) by mouth every 6 (six) hours as needed. 11/11/22  Yes Fayrene Helper, PA-C  [name] 3-0.02 MG tablet TAKE 1 TABLET BY MOUTH DAILY 11/12/22  Yes Nelwyn Salisbury, MD  rizatriptan (MAXALT) 10 MG tablet TAKE 1 TABLET BY MOUTH AT ONSET OF HEADACHE; MAY  REPEAT 1 TABLET IN 2 HOURS IF NEEDED. 11/29/22  Yes Nelwyn Salisbury, MD  valACYclovir (VALTREX) 500 MG tablet Take 1 tablet (500 mg total) by mouth daily. 11/22/22  Yes Nelwyn Salisbury, MD  albuterol (VENTOLIN HFA) 108 (90 Base) MCG/ACT inhaler Inhale 2 puffs into the lungs every 6 (six) hours as needed for wheezing or shortness of breath. 01/04/22   Burchette, Elberta Fortis, MD  amphetamine-dextroamphetamine (ADDERALL) 20 MG tablet Take 1 tablet (20 mg total) by mouth 2 (two) times daily. 12/31/22   Nelwyn Salisbury, MD  EPINEPHrine (EPIPEN 2-PAK) 0.3 mg/0.3 mL IJ SOAJ injection Inject 0.3 mg into the muscle as needed for anaphylaxis. 12/07/22   Nelwyn Salisbury, MD  Erenumab-aooe (AIMOVIG) 70 MG/ML SOAJ Inject 1 Application into the skin every 30 (thirty) days. 11/26/22   Nelwyn Salisbury, MD    Family History Family History  Problem Relation Age of Onset   Diabetes Mother    Cancer Maternal Grandfather     Social History Social History   Tobacco Use   Smoking status: Never   Smokeless tobacco: Never   Tobacco comments:    quit  Vaping Use   Vaping Use: Former  Substance Use Topics   Alcohol use: Yes    Alcohol/week: 0.0 standard drinks of alcohol    Comment: occ   Drug use: No     Allergies   Alprazolam, Bee venom, and Tetanus toxoids   Review of Systems Review of Systems   Physical Exam Triage Vital Signs ED Triage Vitals  Enc Vitals Group     BP 01/01/23 1317 119/74     Pulse Rate 01/01/23 1317 (!) 117     Resp 01/01/23 1317 16     Temp 01/01/23 1317 98.3 F (36.8 C)     Temp Source 01/01/23 1317 Oral     SpO2 01/01/23 1317 98 %     Weight 01/01/23 1317 132 lb (59.9 kg)     Height 01/01/23 1317 5\' 3"  (1.6 m)     Head Circumference --      Peak Flow --      Pain Score 01/01/23 1316 6     Pain Loc --      Pain Edu? --      Excl. in GC? --    No data found.  Updated Vital Signs BP 119/74 (BP Location: Left Arm)   Pulse (!) 117   Temp 98.3 F (36.8 C) (Oral)   Resp 16    Ht 5\' 3"  (1.6 m)   Wt 132 lb (59.9 kg)   SpO2 98%   BMI 23.38 kg/m   Visual Acuity Right Eye Distance:  Left Eye Distance:   Bilateral Distance:    Right Eye Near:   Left Eye Near:    Bilateral Near:     Physical Exam Constitutional:      Appearance: Normal appearance.  HENT:     Head: Normocephalic.     Right Ear: Tympanic membrane, ear canal and external ear normal.     Left Ear: Tympanic membrane, ear canal and external ear normal.     Nose: Congestion and rhinorrhea present.     Right Sinus: Maxillary sinus tenderness present. No frontal sinus tenderness.     Left Sinus: Maxillary sinus tenderness present. No frontal sinus tenderness.     Mouth/Throat:     Pharynx: Oropharynx is clear.     Tonsils: No tonsillar exudate. 1+ on the right. 1+ on the left.  Cardiovascular:     Rate and Rhythm: Normal rate and regular rhythm.     Pulses: Normal pulses.     Heart sounds: Normal heart sounds.  Pulmonary:     Effort: Pulmonary effort is normal.     Breath sounds: Normal breath sounds.  Abdominal:     General: Abdomen is flat. Bowel sounds are normal.     Palpations: Abdomen is soft.     Tenderness: There is abdominal tenderness in the suprapubic area.  Musculoskeletal:     Cervical back: Normal range of motion.  Lymphadenopathy:     Cervical: Cervical adenopathy present.  Skin:    General: Skin is warm and dry.  Neurological:     Mental Status: She is alert and oriented to person, place, and time. Mental status is at baseline.      UC Treatments / Results  Labs (all labs ordered are listed, but only abnormal results are displayed) Labs Reviewed  POCT URINALYSIS DIP (MANUAL ENTRY) - Abnormal; Notable for the following components:      Result Value   Spec Grav, UA >=1.030 (*)    Blood, UA trace-intact (*)    All other components within normal limits    EKG   Radiology No results found.  Procedures Procedures (including critical care time)  Medications  Ordered in UC Medications - No data to display  Initial Impression / Assessment and Plan / UC Course  I have reviewed the triage vital signs and the nursing notes.  Pertinent labs & imaging results that were available during my care of the patient were reviewed by me and considered in my medical decision making (see chart for details).  Acute nonrecurrent maxillary sinusitis, cervical adenopathy Urinary frequency, acute suprapubic pain   Stable patient is in no signs of distress nontoxic-appearing, presentation consistent with a sinusitis at this time we will provide bacterial coverage, azithromycin sent to pharmacy recommended additional over-the-counter medications for supportive care, advise follow-up with her PCP if symptoms continue to persist or worsen  Urinalysis is negative, discussed with patient, STI panel pending, treated prophylactically for yeast due to vaginal itching being present, discussed administration for treatment with Diflucan, advised abstinence until lab results, treatment is complete and all symptoms have resolved, recommended follow-up with PCP if symptoms persist or worsen  Final Clinical Impressions(s) / UC Diagnoses   Final diagnoses:  None   Discharge Instructions   None    ED Prescriptions   None    PDMP not reviewed this encounter.   Valinda Hoar, NP 01/02/23 1029

## 2023-01-02 ENCOUNTER — Other Ambulatory Visit: Payer: Self-pay | Admitting: Family Medicine

## 2023-01-03 ENCOUNTER — Emergency Department (HOSPITAL_BASED_OUTPATIENT_CLINIC_OR_DEPARTMENT_OTHER)
Admission: EM | Admit: 2023-01-03 | Discharge: 2023-01-03 | Disposition: A | Discharge status: Home / Self Care | Payer: Medicaid Other | Attending: Emergency Medicine | Admitting: Emergency Medicine

## 2023-01-03 ENCOUNTER — Other Ambulatory Visit: Payer: Self-pay

## 2023-01-03 ENCOUNTER — Encounter (HOSPITAL_BASED_OUTPATIENT_CLINIC_OR_DEPARTMENT_OTHER): Payer: Self-pay | Admitting: Emergency Medicine

## 2023-01-03 ENCOUNTER — Emergency Department (HOSPITAL_BASED_OUTPATIENT_CLINIC_OR_DEPARTMENT_OTHER): Payer: Medicaid Other

## 2023-01-03 ENCOUNTER — Ambulatory Visit: Payer: Medicaid Other | Admitting: Family Medicine

## 2023-01-03 DIAGNOSIS — R319 Hematuria, unspecified: Secondary | ICD-10-CM | POA: Insufficient documentation

## 2023-01-03 DIAGNOSIS — J45909 Unspecified asthma, uncomplicated: Secondary | ICD-10-CM | POA: Insufficient documentation

## 2023-01-03 DIAGNOSIS — R591 Generalized enlarged lymph nodes: Secondary | ICD-10-CM | POA: Insufficient documentation

## 2023-01-03 DIAGNOSIS — R5383 Other fatigue: Secondary | ICD-10-CM | POA: Insufficient documentation

## 2023-01-03 DIAGNOSIS — R110 Nausea: Secondary | ICD-10-CM | POA: Insufficient documentation

## 2023-01-03 DIAGNOSIS — K5900 Constipation, unspecified: Secondary | ICD-10-CM | POA: Diagnosis not present

## 2023-01-03 DIAGNOSIS — R61 Generalized hyperhidrosis: Secondary | ICD-10-CM | POA: Diagnosis not present

## 2023-01-03 DIAGNOSIS — R1032 Left lower quadrant pain: Secondary | ICD-10-CM | POA: Insufficient documentation

## 2023-01-03 DIAGNOSIS — N941 Unspecified dyspareunia: Secondary | ICD-10-CM | POA: Diagnosis not present

## 2023-01-03 LAB — URINALYSIS, ROUTINE W REFLEX MICROSCOPIC
Bacteria, UA: NONE SEEN
Bilirubin Urine: NEGATIVE
Glucose, UA: NEGATIVE mg/dL
Ketones, ur: NEGATIVE mg/dL
Leukocytes,Ua: NEGATIVE
Nitrite: NEGATIVE
Specific Gravity, Urine: 1.024 (ref 1.005–1.030)
pH: 5.5 (ref 5.0–8.0)

## 2023-01-03 LAB — CERVICOVAGINAL ANCILLARY ONLY
Bacterial Vaginitis (gardnerella): NEGATIVE
Candida Glabrata: NEGATIVE
Candida Vaginitis: NEGATIVE
Chlamydia: NEGATIVE
Comment: NEGATIVE
Comment: NEGATIVE
Comment: NEGATIVE
Comment: NEGATIVE
Comment: NEGATIVE
Comment: NORMAL
Neisseria Gonorrhea: NEGATIVE
Trichomonas: NEGATIVE

## 2023-01-03 LAB — CBC
HCT: 40.1 % (ref 36.0–46.0)
Hemoglobin: 13.6 g/dL (ref 12.0–15.0)
MCH: 28.1 pg (ref 26.0–34.0)
MCHC: 33.9 g/dL (ref 30.0–36.0)
MCV: 82.9 fL (ref 80.0–100.0)
Platelets: 385 10*3/uL (ref 150–400)
RBC: 4.84 MIL/uL (ref 3.87–5.11)
RDW: 13.2 % (ref 11.5–15.5)
WBC: 11.6 10*3/uL — ABNORMAL HIGH (ref 4.0–10.5)
nRBC: 0 % (ref 0.0–0.2)

## 2023-01-03 LAB — COMPREHENSIVE METABOLIC PANEL
ALT: 19 U/L (ref 0–44)
AST: 27 U/L (ref 15–41)
Albumin: 4.7 g/dL (ref 3.5–5.0)
Alkaline Phosphatase: 49 U/L (ref 38–126)
Anion gap: 14 (ref 5–15)
BUN: 8 mg/dL (ref 6–20)
CO2: 22 mmol/L (ref 22–32)
Calcium: 9.6 mg/dL (ref 8.9–10.3)
Chloride: 101 mmol/L (ref 98–111)
Creatinine, Ser: 0.65 mg/dL (ref 0.44–1.00)
GFR, Estimated: >60 mL/min (ref >60)
Glucose, Bld: 98 mg/dL (ref 70–99)
Potassium: 3.6 mmol/L (ref 3.5–5.1)
Sodium: 137 mmol/L (ref 135–145)
Total Bilirubin: 0.6 mg/dL (ref 0.3–1.2)
Total Protein: 7.8 g/dL (ref 6.5–8.1)

## 2023-01-03 LAB — WET PREP, GENITAL
Clue Cells Wet Prep HPF POC: NONE SEEN
Sperm: NONE SEEN
Trich, Wet Prep: NONE SEEN
WBC, Wet Prep HPF POC: >=10 — AB (ref <10)
Yeast Wet Prep HPF POC: NONE SEEN

## 2023-01-03 LAB — PREGNANCY, URINE: Preg Test, Ur: NEGATIVE

## 2023-01-03 LAB — LIPASE, BLOOD: Lipase: 24 U/L (ref 11–51)

## 2023-01-03 MED ORDER — SODIUM CHLORIDE 0.9 % IV BOLUS
1000.0000 mL | Freq: Once | INTRAVENOUS | Status: AC
  Administered 2023-01-03: 1000 mL via INTRAVENOUS

## 2023-01-03 MED ORDER — IOHEXOL 300 MG/ML  SOLN
100.0000 mL | Freq: Once | INTRAMUSCULAR | Status: AC | PRN
  Administered 2023-01-03: 60 mL via INTRAVENOUS

## 2023-01-03 MED ORDER — KETOROLAC TROMETHAMINE 30 MG/ML IJ SOLN
30.0000 mg | Freq: Once | INTRAMUSCULAR | Status: AC
  Administered 2023-01-03: 30 mg via INTRAVENOUS
  Filled 2023-01-03: qty 1

## 2023-01-03 NOTE — ED Notes (Signed)
Returned from ct scan 

## 2023-01-03 NOTE — ED Provider Notes (Signed)
[facility] Provider Note   CSN: [id number] Arrival date & time: 01/03/23  1258     History  Chief Complaint  Patient presents with   Abdominal Pain   Hematuria    Alexandra Meadows is a 27 y.o. female.  With a history of anxiety, depression, asthma, anemia, migraines who presents to the ED for evaluation of left lower quadrant abdominal pain.  She states she has had this pain for "a few months."  It occasionally radiates to the left side of the back.  It is described as a sharp and shooting pain.  It is intermittent.  She reports some associated nausea without emesis.  She states that the pain has gotten worse recently which is the reason for presentation today.  She also complains of fatigue and night sweats as well as cervical lymphadenopathy.  States she presented to her PCP about this but was unsatisfied with the workup that was initiated.  She states that she has not had much for upper respiratory infection type symptoms, however her urgent care visit yesterday had a diagnosis of maxillary sinusitis.  She reports some urinary leaking but denies dysuria, frequency, urgency.  She reports pain with intercourse but denies resting vaginal pain, discharge, odor.  She denies any fevers or chills.  She is sexually active with 1 female partner.  Does not use protection.  She does struggle with constipation.  States her last bowel movement was 2 days ago.  She is still passing gas.  No history of abdominal surgeries.   Abdominal Pain Hematuria Associated symptoms include abdominal pain.       Home Medications Prior to Admission medications   Medication Sig Start Date End Date Taking? Authorizing Provider  albuterol (VENTOLIN HFA) 108 (90 Base) MCG/ACT inhaler Inhale 2 puffs into the lungs every 6 (six) hours as needed for wheezing or shortness of breath. 01/04/22   Burchette, Elberta Fortis, MD  amphetamine-dextroamphetamine (ADDERALL) 20 MG tablet Take 1  tablet (20 mg total) by mouth 2 (two) times daily. 12/31/22   Nelwyn Salisbury, MD  amphetamine-dextroamphetamine (ADDERALL) 20 MG tablet Take 1 tablet (20 mg total) by mouth 2 (two) times daily. 01/30/23   Nelwyn Salisbury, MD  azithromycin (ZITHROMAX) 250 MG tablet Take 1 tablet (250 mg total) by mouth daily. Take first 2 tablets together, then 1 every day until finished. 01/01/23   White, Elita Boone, NP  buPROPion (WELLBUTRIN XL) 150 MG 24 hr tablet Take 1 tablet (150 mg total) by mouth daily. 11/22/22   Nelwyn Salisbury, MD  cyclobenzaprine (FLEXERIL) 10 MG tablet Take 1 tablet (10 mg total) by mouth 2 (two) times daily as needed for muscle spasms. 11/11/22   Fayrene Helper, PA-C  drospirenone-ethinyl estradiol ([name]) 3-0.02 MG tablet TAKE 1 TABLET BY MOUTH DAILY 01/03/23   Nelwyn Salisbury, MD  EPINEPHrine (EPIPEN 2-PAK) 0.3 mg/0.3 mL IJ SOAJ injection Inject 0.3 mg into the muscle as needed for anaphylaxis. 12/07/22   Nelwyn Salisbury, MD  Erenumab-aooe (AIMOVIG) 70 MG/ML SOAJ Inject 1 Application into the skin every 30 (thirty) days. 11/26/22   Nelwyn Salisbury, MD  fluconazole (DIFLUCAN) 150 MG tablet Take 1 tablet (150 mg total) by mouth daily for 2 doses. 01/01/23 01/03/23  Valinda Hoar, NP  ibuprofen (ADVIL) 600 MG tablet Take 1 tablet (600 mg total) by mouth every 6 (six) hours as needed. 11/11/22   Fayrene Helper, PA-C  rizatriptan (MAXALT) 10 MG tablet TAKE 1  TABLET BY MOUTH AT ONSET OF HEADACHE; MAY REPEAT 1 TABLET IN 2 HOURS IF NEEDED. 11/29/22   Nelwyn Salisbury, MD  valACYclovir (VALTREX) 500 MG tablet Take 1 tablet (500 mg total) by mouth daily. 11/22/22   Nelwyn Salisbury, MD      Allergies    Alprazolam, Bee venom, and Tetanus toxoids    Review of Systems   Review of Systems  Gastrointestinal:  Positive for abdominal pain.  Genitourinary:  Positive for pelvic pain.  All other systems reviewed and are negative.   Physical Exam Updated Vital Signs BP 113/80   Pulse 83   Temp 99.1 F (37.3 C) (Oral)    Resp 17   SpO2 100%  Physical Exam Vitals and nursing note reviewed. Exam conducted with a chaperone present Judeth Cornfield, NT).  Constitutional:      General: She is not in acute distress.    Appearance: She is well-developed.  HENT:     Head: Normocephalic and atraumatic.  Eyes:     Conjunctiva/sclera: Conjunctivae normal.  Cardiovascular:     Rate and Rhythm: Normal rate and regular rhythm.     Heart sounds: No murmur heard. Pulmonary:     Effort: Pulmonary effort is normal. No respiratory distress.     Breath sounds: Normal breath sounds.  Abdominal:     Palpations: Abdomen is soft.     Tenderness: There is no abdominal tenderness.  Genitourinary:    Comments: Small amount of white discharge in the vaginal canal.  Left-sided adnexal and mild uterine tenderness to palpation.  No right sided adnexal tenderness.  No masses or fullness appreciated.  No cervical motion tenderness.  No external lesions Musculoskeletal:        General: No swelling.     Cervical back: Neck supple.  Skin:    General: Skin is warm and dry.     Capillary Refill: Capillary refill takes less than 2 seconds.  Neurological:     Mental Status: She is alert.  Psychiatric:        Mood and Affect: Mood normal.     ED Results / Procedures / Treatments   Labs (all labs ordered are listed, but only abnormal results are displayed) Labs Reviewed  WET PREP, GENITAL - Abnormal; Notable for the following components:      Result Value   WBC, Wet Prep HPF POC >=10 (*)    All other components within normal limits  CBC - Abnormal; Notable for the following components:   WBC 11.6 (*)    All other components within normal limits  URINALYSIS, ROUTINE W REFLEX MICROSCOPIC - Abnormal; Notable for the following components:   Hgb urine dipstick TRACE (*)    Protein, ur TRACE (*)    All other components within normal limits  LIPASE, BLOOD  COMPREHENSIVE METABOLIC PANEL  PREGNANCY, URINE    EKG None  Radiology CT  ABDOMEN PELVIS W CONTRAST  Result Date: 01/03/2023 CLINICAL DATA:  Left lower quadrant abdominopelvic pain. Patient reports hematuria. EXAM: CT ABDOMEN AND PELVIS WITH CONTRAST TECHNIQUE: Multidetector CT imaging of the abdomen and pelvis was performed using the standard protocol following bolus administration of intravenous contrast. RADIATION DOSE REDUCTION: This exam was performed according to the departmental dose-optimization program which includes automated exposure control, adjustment of the mA and/or kV according to patient size and/or use of iterative reconstruction technique. CONTRAST:  60mL OMNIPAQUE IOHEXOL 300 MG/ML  SOLN COMPARISON:  CT 05/26/2022 FINDINGS: Lower chest: Clear lung bases. Right lung base  fluid density structure is not seen on the current exam. Hepatobiliary: No focal liver abnormality is seen. No gallstones, gallbladder wall thickening, or biliary dilatation. Pancreas: Unremarkable. No pancreatic ductal dilatation or surrounding inflammatory changes. Spleen: Normal in size without focal abnormality. Adrenals/Urinary Tract: Normal adrenal glands. No hydronephrosis, renal calculi or focal renal abnormality. No perinephric edema or renal inflammation. Both ureters are decompressed without stones along the course. Urinary bladder is completely empty and not well assessed. No bladder stone. Stomach/Bowel: Stomach is within normal limits. Appendix appears normal. No evidence of bowel wall thickening, distention, or inflammatory changes. Small volume of colonic stool. Vascular/Lymphatic: Normal caliber abdominal aorta. Patent portal vein. No acute vascular findings. No enlarged lymph nodes in the abdomen or pelvis. Reproductive: Uterus and bilateral adnexa are unremarkable. Ovaries are quiescent. Previous left adnexal cyst has resolved Other: No abdominal wall or inguinal hernia. No ascites or free air. Musculoskeletal: There are no acute or suspicious osseous abnormalities. IMPRESSION: No  acute abnormality or explanation for left lower quadrant pain. Electronically Signed   By: Narda Rutherford M.D.   On: 01/03/2023 15:16    Procedures Procedures    Medications Ordered in ED Medications  sodium chloride 0.9 % bolus 1,000 mL (0 mLs Intravenous Stopped 01/03/23 1442)  iohexol (OMNIPAQUE) 300 MG/ML solution 100 mL (60 mLs Intravenous Contrast Given 01/03/23 1437)  ketorolac (TORADOL) 30 MG/ML injection 30 mg (30 mg Intravenous Given 01/03/23 1511)    ED Course/ Medical Decision Making/ A&P                             Medical Decision Making Amount and/or Complexity of Data Reviewed Labs: ordered. Radiology: ordered.  Risk Prescription drug management.  This patient presents to the ED for concern of left lower quadrant abdominal pain, this involves an extensive number of treatment options, and is a complaint that carries with it a high risk of complications and morbidity.  Differential diagnosis of her lower abdominal considerations include pelvic inflammatory disease, ectopic pregnancy, appendicitis, urinary calculi, primary dysmenorrhea, septic abortion, ruptured ovarian cyst or tumor, ovarian torsion, tubo-ovarian abscess, degeneration of fibroid, endometriosis, diverticulitis, cystitis.   Co morbidities that complicate the patient evaluation  anxiety, depression, asthma, anemia, migraines  My initial workup includes labs, CT abdomen pelvis, pelvic exam  Additional history obtained from: Nursing notes from this visit. Previous records within EMR system urgent care visit from yesterday  I ordered, reviewed and interpreted labs which include: CBC, CMP, lipase, urinalysis, urine pregnancy, wet prep.  Leukocytosis of 11.6  I ordered imaging studies including CT abdomen pelvis I independently visualized and interpreted imaging which showed no acute intra-abdominal abnormalities I agree with the radiologist interpretation  Afebrile, hemodynamically stable.   27 year old female presents to the ED for evaluation of left lower quadrant abdominal pain.  This appears to be chronic issue as it has been going on for multiple months.  States he got worse recently.  She is also reporting dyspareunia.  She had chlamydia and gonorrhea testing done at urgent care yesterday.  Results are not back yet.  Repeated wet prep today which was overall reassuring.  Does have some white blood cells.  She does have some left adnexal and uterine tenderness but no right adnexal or cervical motion tenderness.  PID was on the differential but considered less likely.  Ovarian torsion is also on differential but considered significantly less likely given her chronicity of symptoms.  Her physical exam  is overall reassuring.  The remainder of her labs are reassuring as well.  CT abdomen pelvis without any acute abnormalities.  She states she cannot follow-up with a gynecologist because of her insurance.  States she has not attempted to call the [facility].  Will give her contact information for this and encouraged her to follow-up. She was given return precautions.  Stable at discharge.  At this time there does not appear to be any evidence of an acute emergency medical condition and the patient appears stable for discharge with appropriate outpatient follow up. Diagnosis was discussed with patient who verbalizes understanding of care plan and is agreeable to discharge. I have discussed return precautions with patient who verbalizes understanding. Patient encouraged to follow-up with their PCP within 1 week. All questions answered.  Note: Portions of this report may have been transcribed using voice recognition software. Every effort was made to ensure accuracy; however, inadvertent computerized transcription errors may still be present.        Final Clinical Impression(s) / ED Diagnoses Final diagnoses:  Left lower quadrant abdominal pain  Dyspareunia in  female    Rx / DC Orders ED Discharge Orders     None         Michelle Piper, Cordelia Poche 01/03/23 1539    Melene Plan, DO 01/04/23 [phone]

## 2023-01-03 NOTE — ED Notes (Signed)
RN reviewed discharge instructions with pt. Pt verbalized understanding and had no further questions. VSS upon discharge.  

## 2023-01-03 NOTE — Discharge Instructions (Addendum)
You have been seen today for your complaint of abdominal pain. Your lab work  was overall reassuring. Your imaging was reassuring and showed no abnormalities. Your discharge medications include Alternate tylenol and ibuprofen for pain. You may alternate these every 4 hours. You may take up to 800 mg of ibuprofen at a time and up to 1000 mg of tylenol. Follow up with: [facility].  Call to schedule an appointment for an ED follow-up visit. Please seek immediate medical care if you develop any of the following symptoms: You cannot stop vomiting. Your pain is only in one part of the abdomen. Pain on the right side could be caused by appendicitis. You have bloody or black poop (stool), or poop that looks like tar. You have trouble breathing. You have chest pain. At this time there does not appear to be the presence of an emergent medical condition, however there is always the potential for conditions to change. Please read and follow the below instructions.  Do not take your medicine if  develop an itchy rash, swelling in your mouth or lips, or difficulty breathing; call 911 and seek immediate emergency medical attention if this occurs.  You may review your lab tests and imaging results in their entirety on your MyChart account.  Please discuss all results of fully with your primary care provider and other specialist at your follow-up visit.  Note: Portions of this text may have been transcribed using voice recognition software. Every effort was made to ensure accuracy; however, inadvertent computerized transcription errors may still be present.

## 2023-01-03 NOTE — ED Triage Notes (Signed)
Pt arrives to ED with c/o abdominal/pelvic pain, cervical lymphadenopathy, constipation, hematuria x7 weeks.

## 2023-01-07 ENCOUNTER — Ambulatory Visit: Payer: Medicaid Other | Admitting: Family Medicine

## 2023-01-13 ENCOUNTER — Telehealth: Payer: Self-pay | Admitting: Family Medicine

## 2023-01-13 NOTE — Telephone Encounter (Addendum)
Pt called to say the pharmacy is giving her a hard time refilling Rx for her Muscle relaxers  Pt states she has been taking this for years.

## 2023-01-18 MED ORDER — CYCLOBENZAPRINE HCL 10 MG PO TABS: 10.0000 mg | ORAL_TABLET | Freq: Three times a day (TID) | ORAL | 5 refills | Status: DC | PRN

## 2023-01-18 NOTE — Addendum Note (Signed)
Addended by: Kern Reap B on: 01/18/2023 03:17 PM   Modules accepted: Orders

## 2023-01-18 NOTE — Telephone Encounter (Signed)
Done

## 2023-01-18 NOTE — Addendum Note (Signed)
Addended by: Gershon Crane A on: 01/18/2023 05:35 PM   Modules accepted: Orders

## 2023-01-18 NOTE — Telephone Encounter (Signed)
Spoke to patient and she is requesting a refill of  cyclobenzaprine (FLEXERIL) 10 MG tablet  Okay to refill?

## 2023-02-17 ENCOUNTER — Ambulatory Visit (INDEPENDENT_AMBULATORY_CARE_PROVIDER_SITE_OTHER): Payer: Medicaid Other | Admitting: Family Medicine

## 2023-02-17 VITALS — BP 106/56 | HR 103 | Temp 98.1°F | Wt 128.4 lb

## 2023-02-17 DIAGNOSIS — S01331A Puncture wound without foreign body of right ear, initial encounter: Secondary | ICD-10-CM | POA: Diagnosis not present

## 2023-02-17 DIAGNOSIS — H6011 Cellulitis of right external ear: Secondary | ICD-10-CM | POA: Diagnosis not present

## 2023-02-17 DIAGNOSIS — H9201 Otalgia, right ear: Secondary | ICD-10-CM | POA: Diagnosis not present

## 2023-02-17 DIAGNOSIS — L089 Local infection of the skin and subcutaneous tissue, unspecified: Secondary | ICD-10-CM | POA: Diagnosis not present

## 2023-02-17 MED ORDER — SULFAMETHOXAZOLE-TRIMETHOPRIM 800-160 MG PO TABS
1.0000 | ORAL_TABLET | Freq: Two times a day (BID) | ORAL | 0 refills | Status: AC
Start: 2023-02-17 — End: 2023-02-24

## 2023-02-17 NOTE — Progress Notes (Signed)
Established Patient Office Visit   Subjective  Patient ID: Alexandra Meadows, female    DOB: [birth year]  Age: 27 y.o. MRN: [id number]  Chief Complaint  Patient presents with   Wound Infection    Has worked with a lot MRSA, had 2 this week. Not sure if she picked up the MRSA or just a nasty infection in her piercing. Started yesterday and had drainagethis morning. It is painful    Pt is a 27 yo female followed by Dr. Clent Ridges and seen for acute concern.  Patient endorses right ear piercing at top of ear with purulent drainage and tenderness starting yesterday.  Patient denies fever, chills, nausea, vomiting.  Patient states piercing is not new, has been present times a while.  Patient notes sensitivity to certain metals causing prior infections.    Past Medical History:  Diagnosis Date   Anemia    Anxiety    Asthma    prn inhaler; exercise induced   Carpal tunnel syndrome    Depression    severe at 20weeks   History of seizures    states caused by Xanax - no seizures since 2015   Infection    BV with preg; recurrent yeast inf   Migraines    MRSA (methicillin resistant staph aureus) culture positive    Seasonal allergies    Tonsillar hypertrophy 04/2016   Past Surgical History:  Procedure Laterality Date   CARPAL TUNNEL RELEASE Bilateral    2022   KNEE ARTHROSCOPY Left 02/28/2014   Procedure: ARTHROSCOPY LEFT KNEE WITH SYNOVECTOMY LIMITED, PARTIAL LATERAL MENISECTOMY;  Surgeon: Loreta Ave, MD;  Location: [facility];  Service: Orthopedics;  Laterality: Left;   TONSILLECTOMY AND ADENOIDECTOMY Bilateral 05/25/2016   Procedure: TONSILLECTOMY AND ADENOIDECTOMY;  Surgeon: Newman Pies, MD;  Location: [facility];  Service: ENT;  Laterality: Bilateral;   WISDOM TOOTH EXTRACTION  2015   Social History   Tobacco Use   Smoking status: Never   Smokeless tobacco: Never   Tobacco comments:    quit  Vaping Use   Vaping status: Former  Substance Use Topics    Alcohol use: Yes    Alcohol/week: 0.0 standard drinks of alcohol    Comment: occ   Drug use: No   Family History  Problem Relation Age of Onset   Diabetes Mother    Cancer Maternal Grandfather    Allergies  Allergen Reactions   Alprazolam Other (See Comments)    SEIZURES Other reaction(s): Other (See Comments) SEIZURES    Bee Venom Hives and Swelling   Tetanus Toxoids Hives      ROS Negative unless stated above    Objective:     BP (!) 106/56 (BP Location: Right Arm, Patient Position: Sitting, Cuff Size: Normal)   Pulse (!) 103   Temp 98.1 F (36.7 C) (Oral)   Wt 128 lb 6.4 oz (58.2 kg)   SpO2 99%   BMI 22.75 kg/m    Physical Exam Constitutional:      General: She is not in acute distress.    Appearance: Normal appearance.  HENT:     Head: Normocephalic and atraumatic.     Ears:      Comments: Several piercings in right ear helix.  Right helix with erythema, edema, purulent drainage at proximal first piercing.  Barbell piercing of right crus helix with purulent drainage/crusting on shaft of metal barbell    Nose: Nose normal.     Mouth/Throat:  Mouth: Mucous membranes are moist.  Eyes:     Extraocular Movements: Extraocular movements intact.     Conjunctiva/sclera: Conjunctivae normal.  Cardiovascular:     Rate and Rhythm: Normal rate.     Heart sounds: No murmur heard.    No gallop.  Pulmonary:     Effort: Pulmonary effort is normal. No respiratory distress.     Breath sounds: No wheezing, rhonchi or rales.  Skin:    General: Skin is warm and dry.  Neurological:     Mental Status: She is alert and oriented to person, place, and time.      No results found for any visits on 02/17/23.    Assessment & Plan:  Cellulitis of right ear -     Sulfamethoxazole-Trimethoprim; Take 1 tablet by mouth 2 (two) times daily for 7 days.  Dispense: 14 tablet; Refill: 0  Pierced ear infection, right, initial encounter  Right ear pain  Infected  piercings of right ear at helix and right crus helix.  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  Switch out piercings after resolution of current symptoms.  Given strict precautions.  Return if symptoms worsen or fail to improve.   Deeann Saint, MD

## 2023-02-23 ENCOUNTER — Encounter: Payer: Self-pay | Admitting: Family Medicine

## 2023-03-01 ENCOUNTER — Other Ambulatory Visit: Payer: Self-pay | Admitting: Family Medicine

## 2023-03-11 ENCOUNTER — Other Ambulatory Visit: Payer: Self-pay | Admitting: Family Medicine

## 2023-04-12 ENCOUNTER — Telehealth: Payer: Self-pay | Admitting: Family Medicine

## 2023-04-12 NOTE — Telephone Encounter (Signed)
Prescription Request  04/12/2023  LOV: 12/16/2022  What is the name of the medication or equipment? amphetamine-dextroamphetamine (ADDERALL) 20 MG tablet   Have you contacted your pharmacy to request a refill? No   Which pharmacy would you like this sent to?  [facility] [id number] - [place], [place] - [address] [place] [address] Phone: [phone] Fax: [phone]    Patient notified that their request is being sent to the clinical staff for review and that they should receive a response within 2 business days.   Please advise at Mobile [phone] (mobile)

## 2023-04-14 MED ORDER — AMPHETAMINE-DEXTROAMPHETAMINE 20 MG PO TABS: 20.0000 mg | ORAL_TABLET | Freq: Two times a day (BID) | ORAL | 0 refills | Status: DC

## 2023-04-14 NOTE — Telephone Encounter (Signed)
Done

## 2023-04-14 NOTE — Telephone Encounter (Signed)
Pt was notified via MyChart.

## 2023-05-24 ENCOUNTER — Telehealth: Payer: Self-pay | Admitting: Family Medicine

## 2023-05-24 NOTE — Telephone Encounter (Signed)
Pt called to say she is starting a new job and needs a letter from MD stating her Rx is medically necessary.  Pt is asking that letter be signed by MD and uploaded to her Mychart, as soon as possible.  amphetamine-dextroamphetamine amphetamine-dextroamphetamine (ADDERALL) 20 MG tablet

## 2023-05-24 NOTE — Telephone Encounter (Signed)
Noted  

## 2023-05-24 NOTE — Telephone Encounter (Signed)
I see she is scheduled to see [place] tomorrow. I will take care of this letter at that time

## 2023-05-25 ENCOUNTER — Encounter: Payer: Self-pay | Admitting: Family Medicine

## 2023-05-25 ENCOUNTER — Ambulatory Visit (INDEPENDENT_AMBULATORY_CARE_PROVIDER_SITE_OTHER): Payer: Medicaid Other | Admitting: Family Medicine

## 2023-05-25 VITALS — BP 108/72 | HR 118 | Temp 98.4°F | Wt 118.0 lb

## 2023-05-25 DIAGNOSIS — J019 Acute sinusitis, unspecified: Secondary | ICD-10-CM | POA: Diagnosis not present

## 2023-05-25 MED ORDER — AMOXICILLIN-POT CLAVULANATE 875-125 MG PO TABS
1.0000 | ORAL_TABLET | Freq: Two times a day (BID) | ORAL | 0 refills | Status: DC
Start: 2023-05-25 — End: 2023-07-01

## 2023-05-25 NOTE — Progress Notes (Signed)
Subjective:    Patient ID: Alexandra Meadows, female    DOB: 1996/04/16, 27 y.o.   MRN: [id number]  HPI Here for 2 weeks of sinus congestion, PND, and blowing out green mucus from the nose. No cough or fever or ST.    Review of Systems  Constitutional: Negative.   HENT:  Positive for congestion, postnasal drip and sinus pressure. Negative for ear pain and sore throat.   Eyes: Negative.   Respiratory: Negative.         Objective:   Physical Exam Constitutional:      Appearance: Normal appearance.  HENT:     Right Ear: Tympanic membrane, ear canal and external ear normal.     Left Ear: Tympanic membrane, ear canal and external ear normal.     Nose: Nose normal.     Mouth/Throat:     Pharynx: Oropharynx is clear.  Eyes:     Conjunctiva/sclera: Conjunctivae normal.  Pulmonary:     Effort: Pulmonary effort is normal.     Breath sounds: Normal breath sounds.  Lymphadenopathy:     Cervical: No cervical adenopathy.  Neurological:     Mental Status: She is alert.           Assessment & Plan:  Sinusitis, treat with 10 days of Augmentin.  Gershon Crane, MD

## 2023-06-10 ENCOUNTER — Other Ambulatory Visit: Payer: Self-pay | Admitting: Family Medicine

## 2023-06-29 ENCOUNTER — Telehealth: Payer: Self-pay | Admitting: Family Medicine

## 2023-06-29 MED ORDER — AMPHETAMINE-DEXTROAMPHETAMINE 20 MG PO TABS: 20.0000 mg | ORAL_TABLET | Freq: Two times a day (BID) | ORAL | 0 refills | Status: DC

## 2023-06-29 NOTE — Telephone Encounter (Signed)
Prescription Request  06/29/2023  LOV: 05/25/2023  What is the name of the medication or equipment? amphetamine-dextroamphetamine (ADDERALL) 20 MG tablet   Have you contacted your pharmacy to request a refill? No   Which pharmacy would you like this sent to?  [facility] [id number] - [place], [place] - [address] [place] [address] Phone: [phone] Fax: [phone]    Patient notified that their request is being sent to the clinical staff for review and that they should receive a response within 2 business days.   Please advise at Mobile [phone] (mobile)

## 2023-06-29 NOTE — Telephone Encounter (Signed)
Done

## 2023-06-29 NOTE — Telephone Encounter (Signed)
Pt LOV was 05/25/2023

## 2023-06-30 ENCOUNTER — Other Ambulatory Visit: Payer: Self-pay

## 2023-06-30 ENCOUNTER — Encounter (HOSPITAL_BASED_OUTPATIENT_CLINIC_OR_DEPARTMENT_OTHER): Payer: Self-pay

## 2023-06-30 ENCOUNTER — Emergency Department (HOSPITAL_BASED_OUTPATIENT_CLINIC_OR_DEPARTMENT_OTHER)
Admission: EM | Admit: 2023-06-30 | Discharge: 2023-07-01 | Disposition: A | Discharge status: Home / Self Care | Payer: Medicaid Other | Attending: Emergency Medicine | Admitting: Emergency Medicine

## 2023-06-30 ENCOUNTER — Emergency Department (HOSPITAL_BASED_OUTPATIENT_CLINIC_OR_DEPARTMENT_OTHER): Payer: Medicaid Other

## 2023-06-30 DIAGNOSIS — N3001 Acute cystitis with hematuria: Secondary | ICD-10-CM | POA: Diagnosis not present

## 2023-06-30 DIAGNOSIS — J45909 Unspecified asthma, uncomplicated: Secondary | ICD-10-CM | POA: Insufficient documentation

## 2023-06-30 DIAGNOSIS — N899 Noninflammatory disorder of vagina, unspecified: Secondary | ICD-10-CM | POA: Diagnosis present

## 2023-06-30 DIAGNOSIS — D72829 Elevated white blood cell count, unspecified: Secondary | ICD-10-CM | POA: Diagnosis not present

## 2023-06-30 LAB — CBC WITH DIFFERENTIAL/PLATELET
Abs Immature Granulocytes: 0.03 10*3/uL (ref 0.00–0.07)
Basophils Absolute: 0 10*3/uL (ref 0.0–0.1)
Basophils Relative: 0 %
Eosinophils Absolute: 0.1 10*3/uL (ref 0.0–0.5)
Eosinophils Relative: 1 %
HCT: 34.1 % — ABNORMAL LOW (ref 36.0–46.0)
Hemoglobin: 11.5 g/dL — ABNORMAL LOW (ref 12.0–15.0)
Immature Granulocytes: 0 %
Lymphocytes Relative: 49 %
Lymphs Abs: 5.2 10*3/uL — ABNORMAL HIGH (ref 0.7–4.0)
MCH: 27.9 pg (ref 26.0–34.0)
MCHC: 33.7 g/dL (ref 30.0–36.0)
MCV: 82.8 fL (ref 80.0–100.0)
Monocytes Absolute: 0.4 10*3/uL (ref 0.1–1.0)
Monocytes Relative: 4 %
Neutro Abs: 4.9 10*3/uL (ref 1.7–7.7)
Neutrophils Relative %: 46 %
Platelets: 304 10*3/uL (ref 150–400)
RBC: 4.12 MIL/uL (ref 3.87–5.11)
RDW: 13.5 % (ref 11.5–15.5)
WBC: 10.7 10*3/uL — ABNORMAL HIGH (ref 4.0–10.5)
nRBC: 0 % (ref 0.0–0.2)

## 2023-06-30 LAB — URINALYSIS, ROUTINE W REFLEX MICROSCOPIC
Bilirubin Urine: NEGATIVE
Glucose, UA: NEGATIVE mg/dL
Ketones, ur: NEGATIVE mg/dL
Leukocytes,Ua: NEGATIVE
Nitrite: NEGATIVE
Protein, ur: NEGATIVE mg/dL
Specific Gravity, Urine: 1.015 (ref 1.005–1.030)
pH: 5.5 (ref 5.0–8.0)

## 2023-06-30 LAB — PREGNANCY, URINE: Preg Test, Ur: NEGATIVE

## 2023-06-30 MED ORDER — SODIUM CHLORIDE 0.9 % IV BOLUS
1000.0000 mL | Freq: Once | INTRAVENOUS | Status: AC
  Administered 2023-06-30: 1000 mL via INTRAVENOUS

## 2023-06-30 MED ORDER — ACETAMINOPHEN 500 MG PO TABS
1000.0000 mg | ORAL_TABLET | Freq: Once | ORAL | Status: AC
  Administered 2023-07-01: 1000 mg via ORAL
  Filled 2023-06-30: qty 2

## 2023-06-30 MED ORDER — DIPHENHYDRAMINE HCL 50 MG/ML IJ SOLN
25.0000 mg | Freq: Once | INTRAMUSCULAR | Status: AC
  Administered 2023-07-01: 25 mg via INTRAVENOUS
  Filled 2023-06-30: qty 1

## 2023-06-30 MED ORDER — PROCHLORPERAZINE EDISYLATE 10 MG/2ML IJ SOLN
5.0000 mg | Freq: Once | INTRAMUSCULAR | Status: AC
  Administered 2023-07-01: 5 mg via INTRAVENOUS
  Filled 2023-06-30: qty 2

## 2023-06-30 NOTE — ED Notes (Signed)
Pt transported to ultrasound via wheelchair.

## 2023-06-30 NOTE — ED Provider Notes (Signed)
[place] EMERGENCY DEPARTMENT AT [facility] Provider Note  CSN: [id number] Arrival date & time: 06/30/23 2206  Chief Complaint(s) No chief complaint on file.  HPI Alexandra Meadows is a 27 y.o. female with past medical history as below, significant for asthma, anxiety, depression, migraines, adhd who presents to the ED with complaint of vaginal discharge.  Patient reports vaginal discharge over the past 4 days, dark brown.  No dysuria but does have some increased urgency and frequency.  Lower pelvic pain over the past few days.  No nausea or vomiting.  No change to bowel movements.  No fevers.  No abdominal trauma.  She is not concerned for STI, she is sexually active.  She is also having a headache, similar to her prior migraines.  She takes triptan at home.  Past Medical History Past Medical History:  Diagnosis Date   Anemia    Anxiety    Asthma    prn inhaler; exercise induced   Carpal tunnel syndrome    Depression    severe at 20weeks   History of seizures    states caused by Xanax - no seizures since 2015   Infection    BV with preg; recurrent yeast inf   Migraines    MRSA (methicillin resistant staph aureus) culture positive    Seasonal allergies    Tonsillar hypertrophy 04/2016   Patient Active Problem List   Diagnosis Date Noted   Laceration of calf 11/30/2022   Genital herpes 11/23/2022   Low back pain 09/21/2021   Vertigo 08/07/2020   Environmental and seasonal allergies 03/10/2020   Chronic bilateral thoracic back pain 08/25/2018   Vaginal bleeding 01/21/2018   SVD (spontaneous vaginal delivery) 12/27/2017   Uterine size date discrepancy pregnancy, third trimester    Umbilical vein abnormality affecting pregnancy    Vitamin D deficiency 07/05/2017   Supervision of high-risk pregnancy 06/30/2017   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 Authorizing Provider  cephALEXin (KEFLEX) 500 MG capsule Take 1 capsule (500 mg total) by mouth 3 (three) times daily for 7 days. 07/01/23 07/08/23 Yes Sloan Leiter, DO  metoCLOPramide (REGLAN) 5 MG tablet Take 1 tablet (5 mg total) by mouth every 8 (eight) hours as needed (for HEADACHE). 07/01/23  Yes Tanda Rockers A, DO  albuterol (VENTOLIN HFA) 108 (90 Base) MCG/ACT inhaler Inhale 2 puffs into the lungs every 6 (six) hours as needed for wheezing or shortness of breath. 01/04/22   Burchette, Elberta Fortis, MD  amphetamine-dextroamphetamine (ADDERALL) 20 MG tablet Take 1 tablet (20 mg total) by mouth 2 (two) times daily. 07/14/23   Nelwyn Salisbury, MD  amphetamine-dextroamphetamine (ADDERALL) 20 MG tablet Take 1 tablet (20 mg total) by mouth 2 (two) times daily. 07/14/23   Nelwyn Salisbury, MD  amphetamine-dextroamphetamine (ADDERALL) 20 MG tablet Take 1 tablet (20 mg total) by mouth 2 (two) times daily. 07/14/23   Nelwyn Salisbury, MD  buPROPion (WELLBUTRIN XL) 150 MG 24 hr tablet TAKE 1 TABLET BY MOUTH DAILY 03/01/23   Nelwyn Salisbury, MD  cyclobenzaprine (FLEXERIL) 10 MG tablet Take 1 tablet (10 mg total) by mouth 3 (three) times daily as needed for muscle spasms. 01/18/23   Nelwyn Salisbury, MD  EPINEPHrine (EPIPEN 2-PAK) 0.3 mg/0.3 mL IJ  SOAJ injection Inject 0.3 mg into the muscle as needed for anaphylaxis. 12/07/22   Nelwyn Salisbury, MD  Erenumab-aooe (AIMOVIG) 70 MG/ML SOAJ Inject 1 Application into the skin every 30 (thirty) days. 11/26/22   Nelwyn Salisbury, MD  ibuprofen (ADVIL) 600 MG tablet Take 1 tablet (600 mg total) by mouth every 6 (six) hours as needed. 11/11/22   Fayrene Helper, PA-C  [name] 3-0.02 MG tablet TAKE 1 TABLET BY MOUTH DAILY 06/10/23   Nelwyn Salisbury, MD  rizatriptan (MAXALT) 10 MG tablet TAKE 1 TABLET BY MOUTH AT ONSET OF HEADACHE; MAY REPEAT 1  TABLET IN 2 HOURS IF NEEDED. 11/29/22   Nelwyn Salisbury, MD  valACYclovir (VALTREX) 500 MG tablet Take 1 tablet (500 mg total) by mouth daily. 11/22/22   Nelwyn Salisbury, MD                                                                                                                                    Past Surgical History Past Surgical History:  Procedure Laterality Date   CARPAL TUNNEL RELEASE Bilateral    2022   KNEE ARTHROSCOPY Left 02/28/2014   Procedure: ARTHROSCOPY LEFT KNEE WITH SYNOVECTOMY LIMITED, PARTIAL LATERAL MENISECTOMY;  Surgeon: Loreta Ave, MD;  Location: [facility];  Service: Orthopedics;  Laterality: Left;   TONSILLECTOMY AND ADENOIDECTOMY Bilateral 05/25/2016   Procedure: TONSILLECTOMY AND ADENOIDECTOMY;  Surgeon: Newman Pies, MD;  Location: [facility];  Service: ENT;  Laterality: Bilateral;   WISDOM TOOTH EXTRACTION  2015   Family History Family History  Problem Relation Age of Onset   Diabetes Mother    Cancer Maternal Grandfather     Social History Social History   Tobacco Use   Smoking status: Never   Smokeless tobacco: Never   Tobacco comments:    quit  Vaping Use   Vaping status: Former  Substance Use Topics   Alcohol use: Yes    Alcohol/week: 0.0 standard drinks of alcohol    Comment: occ   Drug use: No   Allergies Alprazolam, Bee venom, and Tetanus toxoids  Review of Systems Review of Systems  Constitutional:  Negative for chills and fever.  Respiratory:  Negative for chest tightness.   Cardiovascular:  Negative for chest pain.  Gastrointestinal:  Negative for abdominal pain, nausea and vomiting.  Genitourinary:  Positive for pelvic pain and vaginal discharge.  Skin:  Negative for rash.  Neurological:  Positive for headaches.  All other systems reviewed and are negative.   Physical Exam Vital Signs  I have reviewed the triage vital signs BP 113/69   Pulse 93   Temp 98.3 F (36.8 C) (Oral)   Resp 16    SpO2 99%  Physical Exam Vitals and nursing note reviewed.  Constitutional:      General: She is not in acute distress.    Appearance: Normal appearance.  HENT:  Head: Normocephalic and atraumatic.     Right Ear: External ear normal.     Left Ear: External ear normal.     Nose: Nose normal.     Mouth/Throat:     Mouth: Mucous membranes are moist.  Eyes:     General: No scleral icterus.       Right eye: No discharge.        Left eye: No discharge.  Cardiovascular:     Rate and Rhythm: Normal rate.     Pulses: Normal pulses.  Pulmonary:     Effort: Pulmonary effort is normal. No respiratory distress.     Breath sounds: No stridor.  Abdominal:     General: Abdomen is flat. There is no distension.     Palpations: Abdomen is soft.     Tenderness: There is abdominal tenderness.    Musculoskeletal:     Cervical back: No rigidity.     Right lower leg: No edema.     Left lower leg: No edema.  Skin:    General: Skin is warm and dry.     Capillary Refill: Capillary refill takes less than 2 seconds.  Neurological:     Mental Status: She is alert and oriented to person, place, and time.     GCS: GCS eye subscore is 4. GCS verbal subscore is 5. GCS motor subscore is 6.     Cranial Nerves: No dysarthria or facial asymmetry.     Sensory: No sensory deficit.     Motor: No tremor or seizure activity.     Coordination: Coordination is intact.     Gait: Gait is intact.  Psychiatric:        Mood and Affect: Mood normal.        Behavior: Behavior normal. Behavior is cooperative.     ED Results and Treatments Labs (all labs ordered are listed, but only abnormal results are displayed) Labs Reviewed  WET PREP, GENITAL - Abnormal; Notable for the following components:      Result Value   WBC, Wet Prep HPF POC >=10 (*)    All other components within normal limits  URINALYSIS, ROUTINE W REFLEX MICROSCOPIC - Abnormal; Notable for the following components:   Hgb urine dipstick TRACE  (*)    Bacteria, UA MANY (*)    All other components within normal limits  CBC WITH DIFFERENTIAL/PLATELET - Abnormal; Notable for the following components:   WBC 10.7 (*)    Hemoglobin 11.5 (*)    HCT 34.1 (*)    Lymphs Abs 5.2 (*)    All other components within normal limits  COMPREHENSIVE METABOLIC PANEL - Abnormal; Notable for the following components:   Potassium 3.3 (*)    AST 11 (*)    Alkaline Phosphatase 34 (*)    All other components within normal limits  URINE CULTURE  PREGNANCY, URINE  LIPASE, BLOOD  GC/CHLAMYDIA PROBE AMP ([place]) NOT AT [facility]  Radiology US PELVIC TRANSABD W/PELVIC DOPPLER  Result Date: 07/01/2023 CLINICAL DATA:  Pelvic discomfort with vaginal discharge x5 days. EXAM: TRANSABDOMINAL ULTRASOUND OF PELVIS DOPPLER ULTRASOUND OF OVARIES TECHNIQUE: Transabdominal ultrasound examination of the pelvis was performed including evaluation of the uterus, ovaries, adnexal regions, and pelvic cul-de-sac. Color and duplex Doppler ultrasound was utilized to evaluate blood flow to the ovaries. COMPARISON:  None Available. FINDINGS: Uterus Measurements: 8.1 cm x 3.0 cm x 4.3 cm = volume: 54.4 mL. No fibroids or other mass visualized. Endometrium Thickness: 5.5 mm.  No focal abnormality visualized. Right ovary Measurements: 3.6 cm x 1.7 cm x 2.0 cm = volume: 6.3 mL. Normal appearance/no adnexal mass. Left ovary Measurements: 2.9 cm x 1.9 cm x 2.1 cm = volume: 6.3 mL. Normal appearance/no adnexal mass. Pulsed Doppler evaluation demonstrates normal low-resistance arterial and venous waveforms in both ovaries. Other: No pelvic free fluid is noted. IMPRESSION: Unremarkable pelvic ultrasound. Electronically Signed   By: Aram Candela M.D.   On: 07/01/2023 00:44    Pertinent labs & imaging results that were available during my care of the patient were reviewed  by me and considered in my medical decision making (see MDM for details).  Medications Ordered in ED Medications  acetaminophen (TYLENOL) tablet 1,000 mg (1,000 mg Oral Given 07/01/23 0026)  sodium chloride 0.9 % bolus 1,000 mL (0 mLs Intravenous Stopped 07/01/23 0128)  prochlorperazine (COMPAZINE) injection 5 mg (5 mg Intravenous Given 07/01/23 0026)  diphenhydrAMINE (BENADRYL) injection 25 mg (25 mg Intravenous Given 07/01/23 0026)                                                                                                                                     Procedures Procedures  (including critical care time)  Medical Decision Making / ED Course    Medical Decision Making:    Zella Aboulhosn is a 27 y.o. female with past medical history as below, significant for asthma, anxiety, depression, migraines, adhd who presents to the ED with complaint of vaginal discharge. . The complaint involves an extensive differential diagnosis and also carries with it a high risk of complications and morbidity.  Serious etiology was considered. Ddx includes but is not limited to: UTI, STI, FB, preg, menstrual cycle, BV, yeast, etc  Differential diagnosis includes but is not exclusive to ectopic pregnancy, ovarian cyst, ovarian torsion, acute appendicitis, urinary tract infection, endometriosis, bowel obstruction, hernia, colitis, renal colic, gastroenteritis, volvulus etc.   Complete initial physical exam performed, notably the patient was in NAD, tachycardia noted.    Reviewed and confirmed nursing documentation for past medical history, family history, social history.  Vital signs reviewed.    Patient with vaginal discharge, brown.  Will do urinalysis, screening labs, TVUS  Clinical Course as of 07/01/23 0327  Thu Jun 30, 2023  2320 Bacteria, UA(!): MANY [SG]  2320 WBC, UA: 11-20 UA concerning for UTI [SG]  Fri Jul 01, 2023  0050 Clue Cells Wet Prep HPF POC: NONE SEEN [SG]  0051 Trich,  Wet Prep: NONE SEEN [SG]  0051 Yeast Wet Prep HPF POC: NONE SEEN Wet prep reviewed  [SG]  0051 Ultrasound is unremarkable  [SG]  0137 Headache resolved, she takes triptan at home. Will give reglan for home [SG]    Clinical Course User Index [SG] Sloan Leiter, DO    Brief summary: 27 year old female history as above here with vaginal discharge, increased urgency and frequency, suprapubic/lower pelvic pain.  Her exam is reassuring.  Labs reviewed, concern for UTI.  She does not want empiric treatment for STI.  Ultrasound was reassuring.  Pelvic exam was offered, she prefers to follow-up with OB/GYN.  Will start antibiotics for UTI with Keflex.   Patient presents with headache; characteristic of her typical migraine headaches.  Headache has resolved after Compazine, Tylenol, fluids and Benadryl.  Based on the patient's history and physical there is very low clinical suspicion for significant intracranial pathology. The headache was not sudden onset, not maximal at onset, there are no neurologic findings on exam, the patient does not have a fever, the patient does not have any jaw claudication, the patient does not endorse a clotting disorder, patient denies any trauma or eye pain and the headache is not associated with dizziness, weakness on one side of the body, diplopia, vertigo, slurred speech, or ataxia. Given the extremely low risk of these diagnoses further testing and evaluation for these possibilities does not appear to be indicated at this time.  The patient improved significantly and was discharged in stable condition. Detailed discussions were had with the patient regarding current findings, and need for close f/u with PCP or on call doctor. The patient has been instructed to return immediately if the symptoms worsen in any way for re-evaluation. Patient verbalized understanding and is in agreement with current care plan. All questions answered prior to  discharge.                  Additional history obtained: -Additional history obtained from na -External records from outside source obtained and reviewed including: Chart review including previous notes, labs, imaging, consultation notes including  Primary care recommendation, prior ED visits Prior cultures  Lab Tests: -I ordered, reviewed, and interpreted labs.   The pertinent results include:   Labs Reviewed  WET PREP, GENITAL - Abnormal; Notable for the following components:      Result Value   WBC, Wet Prep HPF POC >=10 (*)    All other components within normal limits  URINALYSIS, ROUTINE W REFLEX MICROSCOPIC - Abnormal; Notable for the following components:   Hgb urine dipstick TRACE (*)    Bacteria, UA MANY (*)    All other components within normal limits  CBC WITH DIFFERENTIAL/PLATELET - Abnormal; Notable for the following components:   WBC 10.7 (*)    Hemoglobin 11.5 (*)    HCT 34.1 (*)    Lymphs Abs 5.2 (*)    All other components within normal limits  COMPREHENSIVE METABOLIC PANEL - Abnormal; Notable for the following components:   Potassium 3.3 (*)    AST 11 (*)    Alkaline Phosphatase 34 (*)    All other components within normal limits  URINE CULTURE  PREGNANCY, URINE  LIPASE, BLOOD  GC/CHLAMYDIA PROBE AMP ([place]) NOT AT [facility]    Notable for UTI  EKG   EKG Interpretation Date/Time:    Ventricular Rate:    PR Interval:  QRS Duration:    QT Interval:    QTC Calculation:   R Axis:      Text Interpretation:           Imaging Studies ordered: I ordered imaging studies including ultrasound trans abd (per RT refused TV) I independently visualized the following imaging with scope of interpretation limited to determining acute life threatening conditions related to emergency care; findings noted above I independently visualized and interpreted imaging. I agree with the radiologist interpretation   Medicines ordered and  prescription drug management: Meds ordered this encounter  Medications   acetaminophen (TYLENOL) tablet 1,000 mg   sodium chloride 0.9 % bolus 1,000 mL   prochlorperazine (COMPAZINE) injection 5 mg   diphenhydrAMINE (BENADRYL) injection 25 mg   cephALEXin (KEFLEX) 500 MG capsule    Sig: Take 1 capsule (500 mg total) by mouth 3 (three) times daily for 7 days.    Dispense:  21 capsule    Refill:  0   metoCLOPramide (REGLAN) 5 MG tablet    Sig: Take 1 tablet (5 mg total) by mouth every 8 (eight) hours as needed (for HEADACHE).    Dispense:  8 tablet    Refill:  0    -I have reviewed the patients home medicines and have made adjustments as needed   Consultations Obtained: na   Cardiac Monitoring: Continuous pulse oximetry interpreted by myself, 99% on RA.    Social Determinants of Health:  Diagnosis or treatment significantly limited by social determinants of health: na   Reevaluation: After the interventions noted above, I reevaluated the patient and found that they have improved  Co morbidities that complicate the patient evaluation  Past Medical History:  Diagnosis Date   Anemia    Anxiety    Asthma    prn inhaler; exercise induced   Carpal tunnel syndrome    Depression    severe at 20weeks   History of seizures    states caused by Xanax - no seizures since 2015   Infection    BV with preg; recurrent yeast inf   Migraines    MRSA (methicillin resistant staph aureus) culture positive    Seasonal allergies    Tonsillar hypertrophy 04/2016      Dispostion: Disposition decision including need for hospitalization was considered, and patient discharged from emergency department.    Final Clinical Impression(s) / ED Diagnoses Final diagnoses:  Acute cystitis with hematuria        Sloan Leiter, DO 07/01/23 0327

## 2023-06-30 NOTE — ED Triage Notes (Signed)
Pt c/o "very strange dark brown discharge x5 days." States that she "decided to feel up in there & felt something hard." Suspected hx endometriosis   Ovulation date yesterday, LMP 11/18, last sex Thursday of last week

## 2023-07-01 LAB — COMPREHENSIVE METABOLIC PANEL
ALT: 10 U/L (ref 0–44)
AST: 11 U/L — ABNORMAL LOW (ref 15–41)
Albumin: 4 g/dL (ref 3.5–5.0)
Alkaline Phosphatase: 34 U/L — ABNORMAL LOW (ref 38–126)
Anion gap: 8 (ref 5–15)
BUN: 9 mg/dL (ref 6–20)
CO2: 28 mmol/L (ref 22–32)
Calcium: 9 mg/dL (ref 8.9–10.3)
Chloride: 104 mmol/L (ref 98–111)
Creatinine, Ser: 0.52 mg/dL (ref 0.44–1.00)
GFR, Estimated: >60 mL/min (ref >60)
Glucose, Bld: 85 mg/dL (ref 70–99)
Potassium: 3.3 mmol/L — ABNORMAL LOW (ref 3.5–5.1)
Sodium: 140 mmol/L (ref 135–145)
Total Bilirubin: 0.3 mg/dL (ref <1.2)
Total Protein: 6.6 g/dL (ref 6.5–8.1)

## 2023-07-01 LAB — LIPASE, BLOOD: Lipase: 47 U/L (ref 11–51)

## 2023-07-01 LAB — WET PREP, GENITAL
Clue Cells Wet Prep HPF POC: NONE SEEN
Sperm: NONE SEEN
Trich, Wet Prep: NONE SEEN
WBC, Wet Prep HPF POC: >=10 — AB (ref <10)
Yeast Wet Prep HPF POC: NONE SEEN

## 2023-07-01 MED ORDER — CEPHALEXIN 500 MG PO CAPS: 500.0000 mg | ORAL_CAPSULE | Freq: Three times a day (TID) | ORAL | 0 refills | Status: AC

## 2023-07-01 MED ORDER — METOCLOPRAMIDE HCL 5 MG PO TABS: 5.0000 mg | ORAL_TABLET | Freq: Three times a day (TID) | ORAL | 0 refills | Status: DC | PRN

## 2023-07-01 NOTE — Discharge Instructions (Addendum)
It was a pleasure caring for you today in the emergency department.  Please return to the emergency department for any worsening or worrisome symptoms, including but not limited to: worsening abdominal pain, vomiting, fever, etc  Please follow up with OBGYN as needed

## 2023-07-02 LAB — URINE CULTURE: Culture: NO GROWTH

## 2023-07-03 ENCOUNTER — Other Ambulatory Visit: Payer: Self-pay | Admitting: Family Medicine

## 2023-07-03 DIAGNOSIS — G43009 Migraine without aura, not intractable, without status migrainosus: Secondary | ICD-10-CM

## 2023-07-04 LAB — GC/CHLAMYDIA PROBE AMP (~~LOC~~) NOT AT ARMC
Chlamydia: NEGATIVE
Comment: NEGATIVE
Comment: NORMAL
Neisseria Gonorrhea: NEGATIVE

## 2023-07-14 NOTE — Telephone Encounter (Signed)
error 

## 2023-07-15 ENCOUNTER — Telehealth: Payer: Self-pay

## 2023-07-15 NOTE — Telephone Encounter (Signed)
Copied from CRM [id number]. Topic: Clinical - Medical Advice >> Jul 15, 2023 11:23 AM Orinda Kenner C wrote: Reason for CRM: Pt states UTI is not resolved from hospital 2 weeks ago, still havign dischrge, swollen cervix, burning when urinating, feels worse than when pt went to the hospital. Pt is asking for a different antibiotics, or to be seen today virtual visit. Pt did schedule an appt for Monday 07/18/23. Pls c/b [phone].   [facility] [id number] [place], [place] - [address] [place] [address] Phone:[phone]:[phone]

## 2023-07-18 ENCOUNTER — Ambulatory Visit (INDEPENDENT_AMBULATORY_CARE_PROVIDER_SITE_OTHER): Payer: Medicaid Other | Admitting: Family Medicine

## 2023-07-18 ENCOUNTER — Encounter: Payer: Self-pay | Admitting: Family Medicine

## 2023-07-18 DIAGNOSIS — N760 Acute vaginitis: Secondary | ICD-10-CM

## 2023-07-18 DIAGNOSIS — B9689 Other specified bacterial agents as the cause of diseases classified elsewhere: Secondary | ICD-10-CM

## 2023-07-18 MED ORDER — IRON (FERROUS SULFATE) 325 (65 FE) MG PO TABS: 325.0000 mg | ORAL_TABLET | Freq: Every day | ORAL | 3 refills | Status: AC

## 2023-07-18 MED ORDER — METRONIDAZOLE 0.75 % VA GEL: 1.0000 | Freq: Two times a day (BID) | VAGINAL | 0 refills | Status: DC

## 2023-07-18 NOTE — Telephone Encounter (Signed)
She is on our schedule for this afternoon

## 2023-07-18 NOTE — Progress Notes (Signed)
Subjective:    Patient ID: Alexandra Meadows, female    DOB: 20-Mar-1996, 27 y.o.   MRN: [id number]  HPI Virtual Visit via Video Note  I connected with the patient on 07/18/23 at  3:45 PM EST by a video enabled telemedicine application and verified that I am speaking with the correct person using two identifiers.  Location patient: home Location provider:work or home office Persons participating in the virtual visit: patient, provider  I discussed the limitations of evaluation and management by telemedicine and the availability of in person appointments. The patient expressed understanding and agreed to proceed.   HPI: Here to follow up on an ED visit on 06-30-23 for 5 days of a dark brown vaginal DC that had a foul odor. She also had some burning with urination and some pelvic discomfort. On exam she had some suprapubic and vaginal tenderness. She tested negative for GC and Chlamydia. A wet prep was negative for clue cells, yeast, and Trichomonas, but it showed >10 bacteria. A UA was negative for nitrite and leukocyte esterace, but it had many bacteria. WBC was 10.7. she was diagnosed with a UTI and she was treated with Keflex. However she says her symptoms are unchanged, and her urine culture has returned with no growth. No fevers or nausea.    ROS: See pertinent positives and negatives per HPI.  Past Medical History:  Diagnosis Date   Anemia    Anxiety    Asthma    prn inhaler; exercise induced   Carpal tunnel syndrome    Depression    severe at 20weeks   History of seizures    states caused by Xanax - no seizures since 2015   Infection    BV with preg; recurrent yeast inf   Migraines    MRSA (methicillin resistant staph aureus) culture positive    Seasonal allergies    Tonsillar hypertrophy 04/2016    Past Surgical History:  Procedure Laterality Date   CARPAL TUNNEL RELEASE Bilateral    2022   KNEE ARTHROSCOPY Left 02/28/2014   Procedure: ARTHROSCOPY LEFT KNEE WITH  SYNOVECTOMY LIMITED, PARTIAL LATERAL MENISECTOMY;  Surgeon: Loreta Ave, MD;  Location: [facility];  Service: Orthopedics;  Laterality: Left;   TONSILLECTOMY AND ADENOIDECTOMY Bilateral 05/25/2016   Procedure: TONSILLECTOMY AND ADENOIDECTOMY;  Surgeon: Newman Pies, MD;  Location: [facility];  Service: ENT;  Laterality: Bilateral;   WISDOM TOOTH EXTRACTION  2015    Family History  Problem Relation Age of Onset   Diabetes Mother    Cancer Maternal Grandfather      Current Outpatient Medications:    albuterol (VENTOLIN HFA) 108 (90 Base) MCG/ACT inhaler, Inhale 2 puffs into the lungs every 6 (six) hours as needed for wheezing or shortness of breath., Disp: 8 g, Rfl: 0   amphetamine-dextroamphetamine (ADDERALL) 20 MG tablet, Take 1 tablet (20 mg total) by mouth 2 (two) times daily., Disp: 60 tablet, Rfl: 0   amphetamine-dextroamphetamine (ADDERALL) 20 MG tablet, Take 1 tablet (20 mg total) by mouth 2 (two) times daily., Disp: 60 tablet, Rfl: 0   amphetamine-dextroamphetamine (ADDERALL) 20 MG tablet, Take 1 tablet (20 mg total) by mouth 2 (two) times daily., Disp: 60 tablet, Rfl: 0   buPROPion (WELLBUTRIN XL) 150 MG 24 hr tablet, TAKE 1 TABLET BY MOUTH DAILY, Disp: 30 tablet, Rfl: 2   cyclobenzaprine (FLEXERIL) 10 MG tablet, Take 1 tablet (10 mg total) by mouth 3 (three) times daily as needed for muscle spasms.,  Disp: 60 tablet, Rfl: 5   EPINEPHrine (EPIPEN 2-PAK) 0.3 mg/0.3 mL IJ SOAJ injection, Inject 0.3 mg into the muscle as needed for anaphylaxis., Disp: 1 each, Rfl: 2   Erenumab-aooe (AIMOVIG) 70 MG/ML SOAJ, Inject 1 Application into the skin every 30 (thirty) days., Disp: 1.12 mL, Rfl: 11   ibuprofen (ADVIL) 600 MG tablet, Take 1 tablet (600 mg total) by mouth every 6 (six) hours as needed., Disp: 30 tablet, Rfl: 0   metoCLOPramide (REGLAN) 5 MG tablet, Take 1 tablet (5 mg total) by mouth every 8 (eight) hours as needed (for HEADACHE)., Disp: 8 tablet, Rfl: 0    [name] 3-0.02 MG tablet, TAKE 1 TABLET BY MOUTH DAILY, Disp: 56 tablet, Rfl: 0   rizatriptan (MAXALT) 10 MG tablet, TAKE 1 TABLET BY MOUTH AT ONSET OF HEADACHE; MAY REPEAT 1 TABLET IN 2 HOURS IF NEEDED., Disp: 10 tablet, Rfl: 1   valACYclovir (VALTREX) 500 MG tablet, Take 1 tablet (500 mg total) by mouth daily., Disp: 90 tablet, Rfl: 3  EXAM:  VITALS per patient if applicable:  GENERAL: alert, oriented, appears well and in no acute distress  HEENT: atraumatic, conjunttiva clear, no obvious abnormalities on inspection of external nose and ears  NECK: normal movements of the head and neck  LUNGS: on inspection no signs of respiratory distress, breathing rate appears normal, no obvious gross SOB, gasping or wheezing  CV: no obvious cyanosis  MS: moves all visible extremities without noticeable abnormality  PSYCH/NEURO: pleasant and cooperative, no obvious depression or anxiety, speech and thought processing grossly intact  ASSESSMENT AND PLAN: This is most likely a bacterial vaginitis (BV). We will treat it with 7 days of Metronidazole vaginal gel BID. Recheck as needed. We spent a total of (31   ) minutes reviewing records and discussing these issues.  Gershon Crane, MD  Discussed the following assessment and plan:  No diagnosis found.     I discussed the assessment and treatment plan with the patient. The patient was provided an opportunity to ask questions and all were answered. The patient agreed with the plan and demonstrated an understanding of the instructions.   The patient was advised to call back or seek an in-person evaluation if the symptoms worsen or if the condition fails to improve as anticipated.      Review of Systems     Objective:   Physical Exam        Assessment & Plan:

## 2023-08-08 ENCOUNTER — Encounter: Payer: Self-pay | Admitting: Family Medicine

## 2023-08-09 ENCOUNTER — Ambulatory Visit (INDEPENDENT_AMBULATORY_CARE_PROVIDER_SITE_OTHER): Payer: Medicaid Other | Admitting: Family Medicine

## 2023-08-09 ENCOUNTER — Encounter: Payer: Self-pay | Admitting: Family Medicine

## 2023-08-09 VITALS — BP 118/72 | HR 88 | Temp 97.9°F | Ht 63.0 in | Wt 132.0 lb

## 2023-08-09 DIAGNOSIS — E559 Vitamin D deficiency, unspecified: Secondary | ICD-10-CM

## 2023-08-09 DIAGNOSIS — R5383 Other fatigue: Secondary | ICD-10-CM

## 2023-08-09 DIAGNOSIS — R000 Tachycardia, unspecified: Secondary | ICD-10-CM | POA: Diagnosis not present

## 2023-08-09 DIAGNOSIS — F900 Attention-deficit hyperactivity disorder, predominantly inattentive type: Secondary | ICD-10-CM | POA: Diagnosis not present

## 2023-08-09 DIAGNOSIS — E162 Hypoglycemia, unspecified: Secondary | ICD-10-CM | POA: Diagnosis not present

## 2023-08-09 DIAGNOSIS — D509 Iron deficiency anemia, unspecified: Secondary | ICD-10-CM | POA: Diagnosis not present

## 2023-08-09 LAB — VITAMIN B12: Vitamin B-12: 309 pg/mL (ref 211–911)

## 2023-08-09 LAB — COMPREHENSIVE METABOLIC PANEL
ALT: 27 U/L (ref 0–35)
AST: 22 U/L (ref 0–37)
Albumin: 4.6 g/dL (ref 3.5–5.2)
Alkaline Phosphatase: 54 U/L (ref 39–117)
BUN: 11 mg/dL (ref 6–23)
CO2: 29 meq/L (ref 19–32)
Calcium: 9.6 mg/dL (ref 8.4–10.5)
Chloride: 102 meq/L (ref 96–112)
Creatinine, Ser: 0.59 mg/dL (ref 0.40–1.20)
GFR: 123.21 mL/min (ref >60.00)
Glucose, Bld: 76 mg/dL (ref 70–99)
Potassium: 3.7 meq/L (ref 3.5–5.1)
Sodium: 140 meq/L (ref 135–145)
Total Bilirubin: 0.3 mg/dL (ref 0.2–1.2)
Total Protein: 7.3 g/dL (ref 6.0–8.3)

## 2023-08-09 LAB — CBC WITH DIFFERENTIAL/PLATELET
Basophils Absolute: 0 10*3/uL (ref 0.0–0.1)
Basophils Relative: 0.4 % (ref 0.0–3.0)
Eosinophils Absolute: 0.1 10*3/uL (ref 0.0–0.7)
Eosinophils Relative: 0.8 % (ref 0.0–5.0)
HCT: 37.4 % (ref 36.0–46.0)
Hemoglobin: 12.4 g/dL (ref 12.0–15.0)
Lymphocytes Relative: 48.4 % — ABNORMAL HIGH (ref 12.0–46.0)
Lymphs Abs: 3.9 10*3/uL (ref 0.7–4.0)
MCHC: 33.1 g/dL (ref 30.0–36.0)
MCV: 86.1 fL (ref 78.0–100.0)
Monocytes Absolute: 0.3 10*3/uL (ref 0.1–1.0)
Monocytes Relative: 3.8 % (ref 3.0–12.0)
Neutro Abs: 3.7 10*3/uL (ref 1.4–7.7)
Neutrophils Relative %: 46.6 % (ref 43.0–77.0)
Platelets: 328 10*3/uL (ref 150.0–400.0)
RBC: 4.34 Mil/uL (ref 3.87–5.11)
RDW: 14.1 % (ref 11.5–15.5)
WBC: 8 10*3/uL (ref 4.0–10.5)

## 2023-08-09 LAB — VITAMIN D 25 HYDROXY (VIT D DEFICIENCY, FRACTURES): VITD: 22.3 ng/mL — ABNORMAL LOW (ref 30.00–100.00)

## 2023-08-09 LAB — TSH: TSH: 1.22 u[IU]/mL (ref 0.35–5.50)

## 2023-08-09 MED ORDER — LANCET DEVICE MISC: 1.0000 | Freq: Three times a day (TID) | 0 refills | Status: AC

## 2023-08-09 MED ORDER — LANCETS MISC. MISC: 1.0000 | Freq: Three times a day (TID) | 0 refills | Status: AC

## 2023-08-09 MED ORDER — BLOOD GLUCOSE TEST VI STRP: 1.0000 | ORAL_STRIP | Freq: Three times a day (TID) | 0 refills | Status: AC

## 2023-08-09 MED ORDER — BLOOD GLUCOSE MONITORING SUPPL DEVI: 1.0000 | Freq: Three times a day (TID) | 0 refills | Status: AC

## 2023-08-09 NOTE — Patient Instructions (Addendum)
 We are checking labs today, will be in contact with any results that require further attention  EKG today shows normal sinus rhythm.  Follow up with PCP for further workup.

## 2023-08-09 NOTE — Progress Notes (Signed)
 Acute Office Visit  Subjective:     Patient ID: Alexandra Meadows, female    DOB: 1995-12-27, 28 y.o.   MRN: [id number]  Chief Complaint  Patient presents with   Blood Sugar Problem    Extreme bouts of thirst, feeling out of it, no past history of diabetes but does have family history of diabetes. No current change in diet or medication. Recent reading of 42 on home glucometer (checked several times with similar readings). Patient visited hospital 12/05. Noted of what they thought was a UTI. Patient followed up with their regular primary care provider and they checked for bacterial vaginosis.     HPI Patient is in today for evaluation of symptomatic episode of hypoglycemia yesterday.  States she was not feeling well and decided to check her blood sugar, was 42 on her mother's glucometer. Has had issue in the past with hyperglycemia when starting Adderall over the summer. Seems to have resolved. Has had ER visit with UTI, was treated for UTI. Subsequently saw PCP and was treated with metronidazole  for BV.  ER notes reviewed by me. WBC was elevated, Hgb was low. Inquiring about further workup about why she is feeling poorly and fatigued overall.    ROS Per HPI      Objective:    BP 118/72   Pulse 88   Temp 97.9 F (36.6 C)   Ht 5' 3 (1.6 m)   Wt 132 lb (59.9 kg)   SpO2 91%   BMI 23.38 kg/m    Physical Exam Vitals and nursing note reviewed.  Constitutional:      General: She is not in acute distress.    Appearance: Normal appearance. She is normal weight.     Comments: Appears fatigued  HENT:     Head: Normocephalic and atraumatic.     Right Ear: Tympanic membrane and ear canal normal.     Left Ear: Tympanic membrane and ear canal normal.     Nose: Nose normal.     Mouth/Throat:     Mouth: Mucous membranes are moist.     Pharynx: Oropharynx is clear.  Eyes:     Extraocular Movements: Extraocular movements intact.     Pupils: Pupils are equal, round, and  reactive to light.  Cardiovascular:     Rate and Rhythm: Regular rhythm. Tachycardia present.     Heart sounds: Normal heart sounds.  Pulmonary:     Effort: Pulmonary effort is normal. No respiratory distress.     Breath sounds: Normal breath sounds. No wheezing, rhonchi or rales.  Musculoskeletal:        General: Normal range of motion.     Cervical back: Normal range of motion.  Lymphadenopathy:     Cervical: No cervical adenopathy.  Skin:    General: Skin is warm and dry.  Neurological:     General: No focal deficit present.     Mental Status: She is alert and oriented to person, place, and time.  Psychiatric:        Mood and Affect: Mood normal.        Thought Content: Thought content normal.     No results found for any visits on 08/09/23.      Assessment & Plan:  1. Iron  deficiency anemia, unspecified iron  deficiency anemia type (Primary)  - CBC with Differential/Platelet - TSH - Vitamin B12 - Iron , TIBC and Ferritin Panel  2. ADHD (attention deficit hyperactivity disorder), inattentive type  - Continue current medication regimen  3.  Hypoglycemia  - Comprehensive metabolic panel - TSH - Vitamin B12 - Blood Glucose Monitoring Suppl DEVI; 1 each by Does not apply route in the morning, at noon, and at bedtime. May substitute to any manufacturer covered by patient's insurance.  Dispense: 1 each; Refill: 0 - Glucose Blood (BLOOD GLUCOSE TEST STRIPS) STRP; 1 each by In Vitro route in the morning, at noon, and at bedtime. May substitute to any manufacturer covered by patient's insurance.  Dispense: 100 strip; Refill: 0 - Lancet Device MISC; 1 each by Does not apply route in the morning, at noon, and at bedtime. May substitute to any manufacturer covered by patient's insurance.  Dispense: 1 each; Refill: 0 - Lancets Misc. MISC; 1 each by Does not apply route in the morning, at noon, and at bedtime. May substitute to any manufacturer covered by patient's insurance.   Dispense: 100 each; Refill: 0  4. Other fatigue  - Vitamin B12  5. Vitamin D  deficiency  - VITAMIN D  25 Hydroxy (Vit-D Deficiency, Fractures)  6. Tachycardia  - EKG 12-Lead shows normal sinus rhythm   Meds ordered this encounter  Medications   Blood Glucose Monitoring Suppl DEVI    Sig: 1 each by Does not apply route in the morning, at noon, and at bedtime. May substitute to any manufacturer covered by patient's insurance.    Dispense:  1 each    Refill:  0   Glucose Blood (BLOOD GLUCOSE TEST STRIPS) STRP    Sig: 1 each by In Vitro route in the morning, at noon, and at bedtime. May substitute to any manufacturer covered by patient's insurance.    Dispense:  100 strip    Refill:  0   Lancet Device MISC    Sig: 1 each by Does not apply route in the morning, at noon, and at bedtime. May substitute to any manufacturer covered by patient's insurance.    Dispense:  1 each    Refill:  0   Lancets Misc. MISC    Sig: 1 each by Does not apply route in the morning, at noon, and at bedtime. May substitute to any manufacturer covered by patient's insurance.    Dispense:  100 each    Refill:  0    Return if symptoms worsen or fail to improve.  Corean Ku, FNP

## 2023-08-10 ENCOUNTER — Other Ambulatory Visit: Payer: Self-pay | Admitting: Family Medicine

## 2023-08-10 LAB — IRON,TIBC AND FERRITIN PANEL
%SAT: 18 % (ref 16–45)
Ferritin: 16 ng/mL (ref 16–154)
Iron: 67 ug/dL (ref 40–190)
TIBC: 368 ug/dL (ref 250–450)

## 2023-08-10 MED ORDER — VITAMIN D (ERGOCALCIFEROL) 1.25 MG (50000 UNIT) PO CAPS: 50000.0000 [IU] | ORAL_CAPSULE | ORAL | 0 refills | Status: DC

## 2023-08-10 NOTE — Addendum Note (Signed)
 Addended by: Wellington Half on: 08/10/2023 07:59 AM   Modules accepted: Orders

## 2023-08-12 MED ORDER — METOCLOPRAMIDE HCL 5 MG PO TABS: 5.0000 mg | ORAL_TABLET | Freq: Three times a day (TID) | ORAL | 2 refills | Status: AC | PRN

## 2023-08-12 NOTE — Telephone Encounter (Signed)
Done

## 2023-08-17 ENCOUNTER — Telehealth: Payer: Medicaid Other | Admitting: Family Medicine

## 2023-08-17 ENCOUNTER — Encounter: Payer: Self-pay | Admitting: Family Medicine

## 2023-08-17 ENCOUNTER — Ambulatory Visit: Payer: Medicaid Other | Admitting: Family Medicine

## 2023-08-17 VITALS — Wt 132.0 lb

## 2023-08-17 DIAGNOSIS — B349 Viral infection, unspecified: Secondary | ICD-10-CM

## 2023-08-17 MED ORDER — OSELTAMIVIR PHOSPHATE 75 MG PO CAPS: 75.0000 mg | ORAL_CAPSULE | Freq: Two times a day (BID) | ORAL | 0 refills | Status: DC

## 2023-08-17 NOTE — Progress Notes (Signed)
Subjective:    Patient ID: Alexandra Meadows, female    DOB: [birth year], 28 y.o.   MRN: [id number]  HPI Virtual Visit via Video Note  I connected with the patient on 08/17/23 at 10:15 AM EST by a video enabled telemedicine application and verified that I am speaking with the correct person using two identifiers.  Location patient: home Location provider:work or home office Persons participating in the virtual visit: patient, provider  I discussed the limitations of evaluation and management by telemedicine and the availability of in person appointments. The patient expressed understanding and agreed to proceed.   HPI: Here for 3 days of fever, body aches, ST, and a dry cough. No NVD. Her boyfriend and her children have all had similar symptoms, and they have all tested positive from influenza A. She is drinking fluids.    ROS: See pertinent positives and negatives per HPI.  Past Medical History:  Diagnosis Date   Anemia    Anxiety    Asthma    prn inhaler; exercise induced   Carpal tunnel syndrome    Depression    severe at 20weeks   History of seizures    states caused by Xanax - no seizures since 2015   Infection    BV with preg; recurrent yeast inf   Migraines    MRSA (methicillin resistant staph aureus) culture positive    Seasonal allergies    Tonsillar hypertrophy 04/2016    Past Surgical History:  Procedure Laterality Date   CARPAL TUNNEL RELEASE Bilateral    2022   KNEE ARTHROSCOPY Left 02/28/2014   Procedure: ARTHROSCOPY LEFT KNEE WITH SYNOVECTOMY LIMITED, PARTIAL LATERAL MENISECTOMY;  Surgeon: Loreta Ave, MD;  Location: [facility];  Service: Orthopedics;  Laterality: Left;   TONSILLECTOMY AND ADENOIDECTOMY Bilateral 05/25/2016   Procedure: TONSILLECTOMY AND ADENOIDECTOMY;  Surgeon: Newman Pies, MD;  Location: [facility];  Service: ENT;  Laterality: Bilateral;   WISDOM TOOTH EXTRACTION  2015    Family History  Problem  Relation Age of Onset   Diabetes Mother    Cancer Maternal Grandfather      Current Outpatient Medications:    albuterol (VENTOLIN HFA) 108 (90 Base) MCG/ACT inhaler, Inhale 2 puffs into the lungs every 6 (six) hours as needed for wheezing or shortness of breath., Disp: 8 g, Rfl: 0   amphetamine-dextroamphetamine (ADDERALL) 20 MG tablet, Take 1 tablet (20 mg total) by mouth 2 (two) times daily., Disp: 60 tablet, Rfl: 0   amphetamine-dextroamphetamine (ADDERALL) 20 MG tablet, Take 1 tablet (20 mg total) by mouth 2 (two) times daily., Disp: 60 tablet, Rfl: 0   amphetamine-dextroamphetamine (ADDERALL) 20 MG tablet, Take 1 tablet (20 mg total) by mouth 2 (two) times daily., Disp: 60 tablet, Rfl: 0   Blood Glucose Monitoring Suppl DEVI, 1 each by Does not apply route in the morning, at noon, and at bedtime. May substitute to any manufacturer covered by patient's insurance., Disp: 1 each, Rfl: 0   cyclobenzaprine (FLEXERIL) 10 MG tablet, Take 1 tablet (10 mg total) by mouth 3 (three) times daily as needed for muscle spasms., Disp: 60 tablet, Rfl: 5   EPINEPHrine (EPIPEN 2-PAK) 0.3 mg/0.3 mL IJ SOAJ injection, Inject 0.3 mg into the muscle as needed for anaphylaxis., Disp: 1 each, Rfl: 2   Glucose Blood (BLOOD GLUCOSE TEST STRIPS) STRP, 1 each by In Vitro route in the morning, at noon, and at bedtime. May substitute to any manufacturer covered by patient's insurance., Disp:  100 strip, Rfl: 0   ibuprofen (ADVIL) 600 MG tablet, Take 1 tablet (600 mg total) by mouth every 6 (six) hours as needed., Disp: 30 tablet, Rfl: 0   Iron, Ferrous Sulfate, 325 (65 Fe) MG TABS, Take 325 mg by mouth daily., Disp: 90 tablet, Rfl: 3   Lancet Device MISC, 1 each by Does not apply route in the morning, at noon, and at bedtime. May substitute to any manufacturer covered by patient's insurance., Disp: 1 each, Rfl: 0   Lancets Misc. MISC, 1 each by Does not apply route in the morning, at noon, and at bedtime. May substitute to  any manufacturer covered by patient's insurance., Disp: 100 each, Rfl: 0   metoCLOPramide (REGLAN) 5 MG tablet, Take 1 tablet (5 mg total) by mouth every 8 (eight) hours as needed for vomiting (for HEADACHE)., Disp: 60 tablet, Rfl: 2   metroNIDAZOLE (METROGEL) 0.75 % vaginal gel, Place 1 Applicatorful vaginally 2 (two) times daily., Disp: 70 g, Rfl: 0   [name] 3-0.02 MG tablet, TAKE 1 TABLET BY MOUTH DAILY, Disp: 56 tablet, Rfl: 0   oseltamivir (TAMIFLU) 75 MG capsule, Take 1 capsule (75 mg total) by mouth 2 (two) times daily., Disp: 10 capsule, Rfl: 0   rizatriptan (MAXALT) 10 MG tablet, TAKE 1 TABLET BY MOUTH AT ONSET OF HEADACHE; MAY REPEAT 1 TABLET IN 2 HOURS IF NEEDED., Disp: 10 tablet, Rfl: 1   valACYclovir (VALTREX) 500 MG tablet, Take 1 tablet (500 mg total) by mouth daily., Disp: 90 tablet, Rfl: 3   Vitamin D, Ergocalciferol, (DRISDOL) 1.25 MG (50000 UNIT) CAPS capsule, Take 1 capsule (50,000 Units total) by mouth every 7 (seven) days., Disp: 8 capsule, Rfl: 0  EXAM:  VITALS per patient if applicable:  GENERAL: alert, oriented, appears well and in no acute distress  HEENT: atraumatic, conjunttiva clear, no obvious abnormalities on inspection of external nose and ears  NECK: normal movements of the head and neck  LUNGS: on inspection no signs of respiratory distress, breathing rate appears normal, no obvious gross SOB, gasping or wheezing  CV: no obvious cyanosis  MS: moves all visible extremities without noticeable abnormality  PSYCH/NEURO: pleasant and cooperative, no obvious depression or anxiety, speech and thought processing grossly intact  ASSESSMENT AND PLAN: Viral illness, most likely due to influenza A. Treat with 5 days of Tamiflu.  Gershon Crane, MD  Discussed the following assessment and plan:  No diagnosis found.     I discussed the assessment and treatment plan with the patient. The patient was provided an opportunity to ask questions and all were answered. The  patient agreed with the plan and demonstrated an understanding of the instructions.   The patient was advised to call back or seek an in-person evaluation if the symptoms worsen or if the condition fails to improve as anticipated.      Review of Systems     Objective:   Physical Exam        Assessment & Plan:

## 2023-08-25 ENCOUNTER — Other Ambulatory Visit: Payer: Self-pay | Admitting: Family Medicine

## 2023-08-25 MED ORDER — METRONIDAZOLE 0.75 % VA GEL: 1.0000 | Freq: Two times a day (BID) | VAGINAL | 2 refills | Status: AC

## 2023-08-25 NOTE — Telephone Encounter (Signed)
Copied from CRM [id number]. Topic: Clinical - Medication Refill >> Aug 25, 2023 10:33 AM Florestine Avers wrote: Most Recent Primary Care Visit:  Provider: Gershon Crane A  Department: [facility]  Visit Type: ACUTE  Date: 08/17/2023  Medication: metroNIDAZOLE (METROGEL) 0.75 % vaginal gel  Has the patient contacted their pharmacy? No (Agent: If no, request that the patient contact the pharmacy for the refill. If patient does not wish to contact the pharmacy document the reason why and proceed with request.) (Agent: If yes, when and what did the pharmacy advise?)  Is this the correct pharmacy for this prescription? Yes If no, delete pharmacy and type the correct one.  This is the patient's preferred pharmacy:  [facility] [id number] - [place], [place] - [address] [place] [address] Phone: [phone] Fax: [phone]   Has the prescription been filled recently? Yes  Is the patient out of the medication? Yes  Has the patient been seen for an appointment in the last year OR does the patient have an upcoming appointment? Yes  Can we respond through MyChart? Yes  Agent: Please be advised that Rx refills may take up to 3 business days. We ask that you follow-up with your pharmacy.

## 2023-08-25 NOTE — Telephone Encounter (Signed)
I sent in another tube of the gel and I added refills this time

## 2023-09-16 ENCOUNTER — Ambulatory Visit: Payer: Medicaid Other | Admitting: Family Medicine

## 2023-09-16 ENCOUNTER — Encounter: Payer: Self-pay | Admitting: Family Medicine

## 2023-09-16 VITALS — BP 130/80 | HR 103 | Temp 97.8°F | Ht 63.0 in | Wt 132.0 lb

## 2023-09-16 DIAGNOSIS — H1032 Unspecified acute conjunctivitis, left eye: Secondary | ICD-10-CM | POA: Diagnosis not present

## 2023-09-16 MED ORDER — OFLOXACIN 0.3 % OP SOLN: 1.0000 [drp] | Freq: Four times a day (QID) | OPHTHALMIC | 0 refills | Status: AC

## 2023-09-16 NOTE — Patient Instructions (Signed)
 I have sent in ofloxacin eyedrops for you to use 1 drop 4 times a day for the next 5 days.  I would like for you to wear glasses during this time.  Would also have you try the refresh eyedrops.  Follow-up with me for new or worsening symptoms.

## 2023-09-16 NOTE — Progress Notes (Signed)
   Acute Office Visit  Subjective:     Patient ID: Alexandra Meadows, female    DOB: [birth year], 28 y.o.   MRN: [id number]  Chief Complaint  Patient presents with   Acute Visit    Started today, left eye is red and watery. Denies itching, has had same contacts for 2 months    HPI Patient is in today for evaluation of left eye redness and irritation that started this morning. Reports that eye was draining thick green discharge this morning. Reports that she has been wearing contacts for the last 2 months, states that she does need new ones but does not have ophthalmology appointment yet. She does have glasses. Denies systemic symptoms including fever, cough, other URI symptoms, chills, other symptoms. Medical history as outlined below.  ROS Per HPI      Objective:    BP 130/80 (BP Location: Left Arm, Patient Position: Sitting)   Pulse (!) 103   Temp 97.8 F (36.6 C) (Temporal)   Ht 5\' 3"  (1.6 m)   Wt 132 lb (59.9 kg)   SpO2 98%   BMI 23.38 kg/m    Physical Exam Vitals and nursing note reviewed.  Constitutional:      General: She is not in acute distress.    Appearance: Normal appearance. She is normal weight.  HENT:     Head: Normocephalic and atraumatic.     Nose: Nose normal.  Eyes:     General:        Left eye: Discharge present.    Extraocular Movements: Extraocular movements intact.     Pupils: Pupils are equal, round, and reactive to light.     Comments: Left conjunctiva injected, worse just under the contact lens, clear discharge noted. No eye lid swelling, but mild tenderness  Cardiovascular:     Rate and Rhythm: Normal rate.  Pulmonary:     Effort: Pulmonary effort is normal.  Musculoskeletal:        General: Normal range of motion.     Cervical back: Normal range of motion.  Lymphadenopathy:     Cervical: No cervical adenopathy.  Neurological:     General: No focal deficit present.     Mental Status: She is alert and oriented to person, place,  and time.  Psychiatric:        Mood and Affect: Mood normal.        Thought Content: Thought content normal.     No results found for any visits on 09/16/23.      Assessment & Plan:  1. Acute conjunctivitis of left eye, unspecified acute conjunctivitis type (Primary)  - ofloxacin (OCUFLOX) 0.3 % ophthalmic solution; Place 1 drop into the left eye 4 (four) times daily.  Dispense: 5 mL; Refill: 0   Meds ordered this encounter  Medications   ofloxacin (OCUFLOX) 0.3 % ophthalmic solution    Sig: Place 1 drop into the left eye 4 (four) times daily.    Dispense:  5 mL    Refill:  0    Return if symptoms worsen or fail to improve.  Moshe Cipro, FNP

## 2023-09-22 ENCOUNTER — Encounter: Payer: Self-pay | Admitting: Family Medicine

## 2023-09-22 ENCOUNTER — Telehealth: Payer: Medicaid Other | Admitting: Family Medicine

## 2023-09-22 DIAGNOSIS — F900 Attention-deficit hyperactivity disorder, predominantly inattentive type: Secondary | ICD-10-CM | POA: Diagnosis not present

## 2023-09-22 DIAGNOSIS — H6012 Cellulitis of left external ear: Secondary | ICD-10-CM

## 2023-09-22 MED ORDER — AMPHETAMINE-DEXTROAMPHETAMINE 30 MG PO TABS: 30.0000 mg | ORAL_TABLET | Freq: Two times a day (BID) | ORAL | 0 refills | Status: DC

## 2023-09-22 MED ORDER — MUPIROCIN 2 % EX OINT: 1.0000 | TOPICAL_OINTMENT | Freq: Three times a day (TID) | CUTANEOUS | 1 refills | Status: AC

## 2023-09-22 NOTE — Progress Notes (Signed)
 Subjective:    Patient ID: Alexandra Meadows, female    DOB: [birth year], 28 y.o.   MRN: [id number]  HPI Virtual Visit via Video Note  I connected with the patient on 09/22/23 at  8:45 AM EST by a video enabled telemedicine application and verified that I am speaking with the correct person using two identifiers.  Location patient: home Location provider:work or home office Persons participating in the virtual visit: patient, provider  I discussed the limitations of evaluation and management by telemedicine and the availability of in person appointments. The patient expressed understanding and agreed to proceed.   HPI: Here for 2 issues. First she feels that her ADHD is under treated. She found that her Adderall was not helping her the way it used to, so for several weeks she has been taking 1 and 1/2 tablets of Adderall (30 mg) twice a day, and this has been working well for her. She would like to stay on this regimen. The other issue is that she has another infection on the left era lobe at a piercing site. The area is red and tender, and sometimes a tiny bit of fluid comes out.    ROS: See pertinent positives and negatives per HPI.  Past Medical History:  Diagnosis Date   Anemia    Anxiety    Asthma    prn inhaler; exercise induced   Carpal tunnel syndrome    Depression    severe at 20weeks   History of seizures    states caused by Xanax - no seizures since 2015   Infection    BV with preg; recurrent yeast inf   Migraines    MRSA (methicillin resistant staph aureus) culture positive    Seasonal allergies    Tonsillar hypertrophy 04/2016    Past Surgical History:  Procedure Laterality Date   CARPAL TUNNEL RELEASE Bilateral    2022   KNEE ARTHROSCOPY Left 02/28/2014   Procedure: ARTHROSCOPY LEFT KNEE WITH SYNOVECTOMY LIMITED, PARTIAL LATERAL MENISECTOMY;  Surgeon: Loreta Ave, MD;  Location: [facility];  Service: Orthopedics;  Laterality: Left;    TONSILLECTOMY AND ADENOIDECTOMY Bilateral 05/25/2016   Procedure: TONSILLECTOMY AND ADENOIDECTOMY;  Surgeon: Newman Pies, MD;  Location: [facility];  Service: ENT;  Laterality: Bilateral;   WISDOM TOOTH EXTRACTION  2015    Family History  Problem Relation Age of Onset   Diabetes Mother    Cancer Maternal Grandfather      Current Outpatient Medications:    albuterol (VENTOLIN HFA) 108 (90 Base) MCG/ACT inhaler, Inhale 2 puffs into the lungs every 6 (six) hours as needed for wheezing or shortness of breath., Disp: 8 g, Rfl: 0   amphetamine-dextroamphetamine (ADDERALL) 20 MG tablet, Take 1 tablet (20 mg total) by mouth 2 (two) times daily., Disp: 60 tablet, Rfl: 0   amphetamine-dextroamphetamine (ADDERALL) 20 MG tablet, Take 1 tablet (20 mg total) by mouth 2 (two) times daily., Disp: 60 tablet, Rfl: 0   amphetamine-dextroamphetamine (ADDERALL) 20 MG tablet, Take 1 tablet (20 mg total) by mouth 2 (two) times daily., Disp: 60 tablet, Rfl: 0   Blood Glucose Monitoring Suppl DEVI, 1 each by Does not apply route in the morning, at noon, and at bedtime. May substitute to any manufacturer covered by patient's insurance., Disp: 1 each, Rfl: 0   cyclobenzaprine (FLEXERIL) 10 MG tablet, Take 1 tablet (10 mg total) by mouth 3 (three) times daily as needed for muscle spasms., Disp: 60 tablet, Rfl: 5  EPINEPHrine (EPIPEN 2-PAK) 0.3 mg/0.3 mL IJ SOAJ injection, Inject 0.3 mg into the muscle as needed for anaphylaxis., Disp: 1 each, Rfl: 2   ibuprofen (ADVIL) 600 MG tablet, Take 1 tablet (600 mg total) by mouth every 6 (six) hours as needed., Disp: 30 tablet, Rfl: 0   Iron, Ferrous Sulfate, 325 (65 Fe) MG TABS, Take 325 mg by mouth daily., Disp: 90 tablet, Rfl: 3   metoCLOPramide (REGLAN) 5 MG tablet, Take 1 tablet (5 mg total) by mouth every 8 (eight) hours as needed for vomiting (for HEADACHE)., Disp: 60 tablet, Rfl: 2   metroNIDAZOLE (METROGEL) 0.75 % vaginal gel, Place 1 Applicatorful vaginally  2 (two) times daily., Disp: 70 g, Rfl: 2   mupirocin ointment (BACTROBAN) 2 %, Apply 1 Application topically 3 (three) times daily., Disp: 22 g, Rfl: 1   [name] 3-0.02 MG tablet, TAKE 1 TABLET BY MOUTH DAILY, Disp: 56 tablet, Rfl: 0   ofloxacin (OCUFLOX) 0.3 % ophthalmic solution, Place 1 drop into the left eye 4 (four) times daily., Disp: 5 mL, Rfl: 0   rizatriptan (MAXALT) 10 MG tablet, TAKE 1 TABLET BY MOUTH AT ONSET OF HEADACHE; MAY REPEAT 1 TABLET IN 2 HOURS IF NEEDED., Disp: 10 tablet, Rfl: 1   valACYclovir (VALTREX) 500 MG tablet, Take 1 tablet (500 mg total) by mouth daily., Disp: 90 tablet, Rfl: 3   Vitamin D, Ergocalciferol, (DRISDOL) 1.25 MG (50000 UNIT) CAPS capsule, Take 1 capsule (50,000 Units total) by mouth every 7 (seven) days., Disp: 8 capsule, Rfl: 0  EXAM:  VITALS per patient if applicable:  GENERAL: alert, oriented, appears well and in no acute distress  HEENT: atraumatic, conjunttiva clear, no obvious abnormalities on inspection of external nose and ears  NECK: normal movements of the head and neck  LUNGS: on inspection no signs of respiratory distress, breathing rate appears normal, no obvious gross SOB, gasping or wheezing  CV: no obvious cyanosis  MS: moves all visible extremities without noticeable abnormality  PSYCH/NEURO: pleasant and cooperative, no obvious depression or anxiety, speech and thought processing grossly intact  ASSESSMENT AND PLAN: For her ADHD, we will increase the Adderall to 30 mg BID. For the cellulitis on her ear lobe, she will apply Mupiricin ointment TID as needed.  Gershon Crane, MD  Discussed the following assessment and plan:  No diagnosis found.     I discussed the assessment and treatment plan with the patient. The patient was provided an opportunity to ask questions and all were answered. The patient agreed with the plan and demonstrated an understanding of the instructions.   The patient was advised to call back or seek an  in-person evaluation if the symptoms worsen or if the condition fails to improve as anticipated.      Review of Systems     Objective:   Physical Exam        Assessment & Plan:

## 2023-10-01 ENCOUNTER — Other Ambulatory Visit: Payer: Self-pay | Admitting: Family Medicine

## 2023-10-06 ENCOUNTER — Other Ambulatory Visit: Payer: Self-pay | Admitting: Family Medicine

## 2023-10-06 DIAGNOSIS — G43009 Migraine without aura, not intractable, without status migrainosus: Secondary | ICD-10-CM

## 2023-11-18 ENCOUNTER — Ambulatory Visit: Payer: Self-pay

## 2023-11-18 NOTE — Telephone Encounter (Signed)
  Chief Complaint: vaginal symptoms Symptoms: cervical swelling that has decreased from last week Frequency: started last Friday Pertinent Negatives: Patient denies nausea, vomiting, abdominal pain Disposition: [] ED /[] Urgent Care (no appt availability in office) / [x] Appointment(In office/virtual)/ []  [facility]/ [] Home Care/ [] Refused Recommended Disposition /[]  Mobile Bus/ []  Follow-up with PCP Additional Notes: patient calling to setup an appointment to be seen. Patient reports cervical swelling that started last Friday. Patient reports hx of cervical swelling and currently swelling has decreased. Patient recommended to be seen within 2 weeks. Patient is scheduled for November 23, 2023 at 8:30 AM. Patient verbalized understanding and all questions answered.    Copied from CRM [id number]. Topic: Clinical - Red Word Triage >> Nov 18, 2023  9:03 AM Juluis Ok wrote: [facility] that prompted transfer to Nurse Triage: cervix pain Reason for Disposition  All other vaginal symptoms  (Exception: Feels like prior yeast infection, minor abrasion, mild rash < 24 hour duration, mild itching.)  Answer Assessment - Initial Assessment Questions 1. SYMPTOM: "What's the main symptom you're concerned about?" (e.g., pain, itching, dryness)     Swelling to cervix 2. LOCATION: "Where is the  swelling to cervix located?" (e.g., inside/outside, left/right)     inside 3. ONSET: "When did the  swelling to cervix  start?"     Started last Friday 4. PAIN: "Is there any pain?" If Yes, ask: "How bad is it?" (Scale: 1-10; mild, moderate, severe)   -  MILD (1-3): Doesn't interfere with normal activities.    -  MODERATE (4-7): Interferes with normal activities (e.g., work or school) or awakens from sleep.     -  SEVERE (8-10): Excruciating pain, unable to do any normal activities.     6-7 out of 10 abdominal pain 5. ITCHING: "Is there any itching?" If Yes, ask: "How bad is it?" (Scale: 1-10; mild,  moderate, severe)     no      7. OTHER SYMPTOMS: "Do you have any other symptoms?" (e.g., fever, itching, vaginal bleeding, pain with urination, injury to genital area, vaginal foreign body)      8. PREGNANCY: "Is there any chance you are pregnant?" "When was your last menstrual period?"     no  Protocols used: Vaginal Symptoms-A-AH

## 2023-11-21 NOTE — Telephone Encounter (Signed)
 Noted.

## 2023-11-23 ENCOUNTER — Ambulatory Visit: Admitting: Family Medicine

## 2023-12-05 ENCOUNTER — Ambulatory Visit: Admitting: Family Medicine

## 2023-12-05 ENCOUNTER — Encounter: Payer: Self-pay | Admitting: Family Medicine

## 2023-12-05 VITALS — BP 124/68 | HR 67 | Temp 98.7°F | Ht 63.0 in | Wt 133.0 lb

## 2023-12-05 DIAGNOSIS — R0982 Postnasal drip: Secondary | ICD-10-CM

## 2023-12-05 DIAGNOSIS — J029 Acute pharyngitis, unspecified: Secondary | ICD-10-CM

## 2023-12-05 LAB — POCT RAPID STREP A (OFFICE): Rapid Strep A Screen: NEGATIVE

## 2023-12-05 NOTE — Progress Notes (Signed)
 Established Patient Office Visit   Subjective  Patient ID: Alexandra Meadows, female    DOB: [birth year]  Age: 28 y.o. MRN: [id number]  Chief Complaint  Patient presents with   Sore Throat    Patient is a 28 year old female followed by Dr. Alyne Babinski and seen for acute concern.  Patient endorses sore throat starting last night with continued symptoms this morning.  Patient also had chills.  Denies headache, fever, ear pain/pressure, facial pain/pressure, rhinorrhea, cough.  Patient states her daughter was diagnosed with strep throat this morning and several of her younger relatives have been sick with viral illnesses.  Patient has not tried anything for her symptoms.    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   Anxiety and depression 02/17/2014   INSOMNIA 03/20/2008   ADHD (attention deficit hyperactivity disorder), 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   Migraine without aura 03/29/2007   Past Medical History:  Diagnosis Date   Anemia    Anxiety    Asthma    prn inhaler; exercise induced   Carpal tunnel syndrome    Depression    severe at 20weeks   History of seizures    states caused by Xanax  - no seizures since 2015   Infection    BV with preg; recurrent yeast inf   Migraines    MRSA (methicillin resistant staph aureus) culture positive    Seasonal allergies    Tonsillar hypertrophy 04/2016   Past Surgical History:  Procedure Laterality Date   CARPAL TUNNEL RELEASE  Bilateral    2022   KNEE ARTHROSCOPY Left 02/28/2014   Procedure: ARTHROSCOPY LEFT KNEE WITH SYNOVECTOMY LIMITED, PARTIAL LATERAL MENISECTOMY;  Surgeon: Ferd Householder, MD;  Location: [facility];  Service: Orthopedics;  Laterality: Left;   TONSILLECTOMY AND ADENOIDECTOMY Bilateral 05/25/2016   Procedure: TONSILLECTOMY AND ADENOIDECTOMY;  Surgeon: Reynold Caves, MD;  Location: [facility];  Service: ENT;  Laterality: Bilateral;   WISDOM TOOTH EXTRACTION  2015   Social History   Tobacco Use   Smoking status: Never   Smokeless tobacco: Never   Tobacco comments:    quit  Vaping Use   Vaping status: Former  Substance Use Topics   Alcohol use: Yes    Alcohol/week: 0.0 standard drinks of alcohol    Comment: occ   Drug use: No   Family History  Problem Relation Age of Onset   Diabetes Mother    Cancer Maternal Grandfather    Allergies  Allergen Reactions   Alprazolam  Other (See Comments)    SEIZURES Other reaction(s): Other (See Comments) SEIZURES    Bee Venom Hives and Swelling   Tetanus Toxoids Hives      ROS Negative unless stated above    Objective:      BP 124/68 (BP Location: Left Arm, Patient Position: Sitting, Cuff Size: Normal)   Pulse 67   Temp 98.7 F (37.1 C) (Oral)   Ht 5\' 3"  (1.6 m)   Wt 133 lb (60.3 kg)  LMP  (LMP Unknown)   SpO2 95%   BMI 23.56 kg/m  BP Readings from Last 3 Encounters:  12/05/23 124/68  09/16/23 130/80  08/09/23 118/72   Wt Readings from Last 3 Encounters:  12/05/23 133 lb (60.3 kg)  09/16/23 132 lb (59.9 kg)  08/17/23 132 lb (59.9 kg)     Physical Exam Constitutional:      General: She is not in acute distress.    Appearance: Normal appearance.  HENT:     Head: Normocephalic and atraumatic.     Nose: Nose normal.     Mouth/Throat:     Mouth: Mucous membranes are moist.     Pharynx: Postnasal drip present. No oropharyngeal exudate.     Comments: Mild erythema of pharynx. Cardiovascular:      Rate and Rhythm: Normal rate and regular rhythm.     Heart sounds: Normal heart sounds. No murmur heard.    No gallop.  Pulmonary:     Effort: Pulmonary effort is normal. No respiratory distress.     Breath sounds: Normal breath sounds. No wheezing, rhonchi or rales.  Skin:    General: Skin is warm and dry.  Neurological:     Mental Status: She is alert and oriented to person, place, and time.    Results for orders placed or performed in visit on 12/05/23  POCT rapid strep A  Result Value Ref Range   Rapid Strep A Screen Negative Negative      Assessment & Plan:   Sore throat -     POCT rapid strep A  Post-nasal drainage  Patient with acute sore throat and exposure to strep throat.  POC strep test negative.  Exam reassuring.  Postnasal drainage likely contributing to sore throat.  Discussed supportive care including rest, hydration, gargling with warm salt water Chloraseptic spray, OTC cold medications.  Consider switching antihistamine given history of seasonal allergies.  Return if symptoms worsen or fail to improve.   Viola Greulich, MD

## 2023-12-07 ENCOUNTER — Ambulatory Visit: Admitting: Family Medicine

## 2023-12-07 ENCOUNTER — Encounter: Payer: Self-pay | Admitting: Family Medicine

## 2023-12-07 VITALS — BP 122/70 | HR 88 | Temp 98.2°F | Ht 63.0 in | Wt 133.2 lb

## 2023-12-07 DIAGNOSIS — R5383 Other fatigue: Secondary | ICD-10-CM

## 2023-12-07 DIAGNOSIS — R42 Dizziness and giddiness: Secondary | ICD-10-CM | POA: Diagnosis not present

## 2023-12-07 DIAGNOSIS — J029 Acute pharyngitis, unspecified: Secondary | ICD-10-CM | POA: Diagnosis not present

## 2023-12-07 NOTE — Progress Notes (Signed)
 Established Patient Office Visit   Subjective  Patient ID: Alexandra Meadows, female    DOB: 20-Jan-1996  Age: 28 y.o. MRN: [id number]  Chief Complaint  Patient presents with   Sore Throat    Follow-up for sore throat, patient is still not feeling well dizzy tired     Patient is a 28 year old female followed by Dr. Alyne Babinski and seen for acute concern.  Patient seen 2 days ago for sore throat that just started.  Patient was concerned for possible strep pharyngitis as her daughter was diagnosed that morning.  Patient returns stating still not feeling well.  Endorses dizziness, slight pressure behind eyes, headache, fatigue, chills, continued sore throat.  Patient notes difficulty sleeping, took Benadryl .  States appetite is so-so.  Endorses being "bad at drinking water" when feeling well.  Not take anything for symptoms.  Endorses history of seasonal allergies.    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   Anxiety and depression 02/17/2014   INSOMNIA 03/20/2008   ADHD (attention deficit hyperactivity disorder), 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   Migraine without aura 03/29/2007   Past Medical History:  Diagnosis Date   Anemia    Anxiety    Asthma    prn inhaler; exercise induced   Carpal tunnel syndrome    Depression    severe at 20weeks   History of seizures    states caused by Xanax  - no seizures since 2015   Infection    BV with preg; recurrent yeast inf   Migraines     MRSA (methicillin resistant staph aureus) culture positive    Seasonal allergies    Tonsillar hypertrophy 04/2016   Past Surgical History:  Procedure Laterality Date   CARPAL TUNNEL RELEASE Bilateral    2022   KNEE ARTHROSCOPY Left 02/28/2014   Procedure: ARTHROSCOPY LEFT KNEE WITH SYNOVECTOMY LIMITED, PARTIAL LATERAL MENISECTOMY;  Surgeon: Ferd Householder, MD;  Location: [facility];  Service: Orthopedics;  Laterality: Left;   TONSILLECTOMY AND ADENOIDECTOMY Bilateral 05/25/2016   Procedure: TONSILLECTOMY AND ADENOIDECTOMY;  Surgeon: Reynold Caves, MD;  Location: [facility];  Service: ENT;  Laterality: Bilateral;   WISDOM TOOTH EXTRACTION  2015   Social History   Tobacco Use   Smoking status: Never   Smokeless tobacco: Never   Tobacco comments:    quit  Vaping Use   Vaping status: Former  Substance Use Topics   Alcohol use: Yes    Alcohol/week: 0.0 standard drinks of alcohol    Comment: occ   Drug use: No   Family History  Problem Relation Age of Onset   Diabetes Mother    Cancer Maternal Grandfather    Allergies  Allergen Reactions   Alprazolam  Other (See Comments)    SEIZURES Other reaction(s): Other (See Comments) SEIZURES    Bee Venom Hives and Swelling   Tetanus Toxoids Hives    ROS Negative unless stated above    Objective:      BP 122/70 (BP Location: Left  Arm, Patient Position: Sitting, Cuff Size: Normal)   Pulse 88   Temp 98.2 F (36.8 C) (Oral)   Ht 5\' 3"  (1.6 m)   Wt 133 lb 3.2 oz (60.4 kg)   LMP  (LMP Unknown)   SpO2 97%   BMI 23.60 kg/m  BP Readings from Last 3 Encounters:  12/07/23 122/70  12/05/23 124/68  09/16/23 130/80   Wt Readings from Last 3 Encounters:  12/07/23 133 lb 3.2 oz (60.4 kg)  12/05/23 133 lb (60.3 kg)  09/16/23 132 lb (59.9 kg)      Physical Exam Constitutional:      General: She is not in acute distress.    Appearance: Normal appearance.  HENT:     Head: Normocephalic and  atraumatic.     Right Ear: Hearing, tympanic membrane, ear canal and external ear normal.     Left Ear: Hearing, tympanic membrane, ear canal and external ear normal.     Ears:     Comments: TMs normal bilaterally.  No erythema, exudate, fullness, or retraction.    Nose: Nose normal.     Right Sinus: No maxillary sinus tenderness or frontal sinus tenderness.     Left Sinus: No maxillary sinus tenderness or frontal sinus tenderness.     Mouth/Throat:     Mouth: Mucous membranes are moist.     Pharynx: Postnasal drip present. No pharyngeal swelling, oropharyngeal exudate or posterior oropharyngeal erythema.     Tonsils: No tonsillar exudate.  Cardiovascular:     Rate and Rhythm: Normal rate and regular rhythm.     Heart sounds: Normal heart sounds. No murmur heard.    No gallop.  Pulmonary:     Effort: Pulmonary effort is normal. No respiratory distress.     Breath sounds: Normal breath sounds. No wheezing, rhonchi or rales.  Skin:    General: Skin is warm and dry.  Neurological:     Mental Status: She is alert and oriented to person, place, and time.        11/24/2021   11:02 AM 02/09/2019    9:33 AM 11/30/2017    4:55 PM  Depression screen PHQ 2/9  Decreased Interest 0 0 1  Down, Depressed, Hopeless 0 0 1  PHQ - 2 Score 0 0 2  Altered sleeping 0  2  Tired, decreased energy 0  1  Change in appetite 0  0  Feeling bad or failure about yourself  0  2  Trouble concentrating 0  0  Moving slowly or fidgety/restless 0  0  Suicidal thoughts 0  0  PHQ-9 Score 0  7  Difficult doing work/chores Not difficult at all  Not difficult at all       No data to display           No results found for any visits on 12/07/23.    Assessment & Plan:   Sore throat  Other fatigue  Dizziness  Patient with continued sore throat x 3 days.  Advised does likely viral versus due to postnasal drainage from seasonal allergies.  Patient advised dehydration likely also contributing to symptoms  of dizziness.  Patient advised to switch OTC allergy medication to a different med.  Advised to increase hydration, rest, etc.  Discussed likely duration of symptoms.  Follow-up with PCP as needed.  Patient requested note for work.  Return if symptoms worsen or fail to improve.   Viola Greulich, MD

## 2023-12-10 ENCOUNTER — Emergency Department (HOSPITAL_BASED_OUTPATIENT_CLINIC_OR_DEPARTMENT_OTHER)
Admission: EM | Admit: 2023-12-10 | Discharge: 2023-12-11 | Disposition: A | Discharge status: Home / Self Care | Attending: Emergency Medicine | Admitting: Emergency Medicine

## 2023-12-10 ENCOUNTER — Encounter (HOSPITAL_BASED_OUTPATIENT_CLINIC_OR_DEPARTMENT_OTHER): Payer: Self-pay

## 2023-12-10 ENCOUNTER — Other Ambulatory Visit: Payer: Self-pay

## 2023-12-10 DIAGNOSIS — R1031 Right lower quadrant pain: Secondary | ICD-10-CM | POA: Diagnosis present

## 2023-12-10 DIAGNOSIS — N83201 Unspecified ovarian cyst, right side: Secondary | ICD-10-CM | POA: Diagnosis not present

## 2023-12-10 DIAGNOSIS — R1084 Generalized abdominal pain: Secondary | ICD-10-CM | POA: Diagnosis not present

## 2023-12-10 DIAGNOSIS — Z789 Other specified health status: Secondary | ICD-10-CM | POA: Diagnosis not present

## 2023-12-10 DIAGNOSIS — N83209 Unspecified ovarian cyst, unspecified side: Secondary | ICD-10-CM

## 2023-12-10 DIAGNOSIS — R102 Pelvic and perineal pain: Secondary | ICD-10-CM

## 2023-12-10 LAB — COMPREHENSIVE METABOLIC PANEL WITH GFR
ALT: 35 U/L (ref 0–44)
AST: 28 U/L (ref 15–41)
Albumin: 4.6 g/dL (ref 3.5–5.0)
Alkaline Phosphatase: 70 U/L (ref 38–126)
Anion gap: 14 (ref 5–15)
BUN: 5 mg/dL — ABNORMAL LOW (ref 6–20)
CO2: 24 mmol/L (ref 22–32)
Calcium: 9.4 mg/dL (ref 8.9–10.3)
Chloride: 101 mmol/L (ref 98–111)
Creatinine, Ser: 0.65 mg/dL (ref 0.44–1.00)
GFR, Estimated: >60 mL/min (ref >60)
Glucose, Bld: 92 mg/dL (ref 70–99)
Potassium: 3.9 mmol/L (ref 3.5–5.1)
Sodium: 140 mmol/L (ref 135–145)
Total Bilirubin: 0.3 mg/dL (ref 0.0–1.2)
Total Protein: 7.6 g/dL (ref 6.5–8.1)

## 2023-12-10 LAB — URINALYSIS, ROUTINE W REFLEX MICROSCOPIC
Bilirubin Urine: NEGATIVE
Glucose, UA: NEGATIVE mg/dL
Hgb urine dipstick: NEGATIVE
Ketones, ur: NEGATIVE mg/dL
Leukocytes,Ua: NEGATIVE
Nitrite: NEGATIVE
Specific Gravity, Urine: 1.023 (ref 1.005–1.030)
pH: 7 (ref 5.0–8.0)

## 2023-12-10 LAB — CBC
HCT: 39.7 % (ref 36.0–46.0)
Hemoglobin: 13.5 g/dL (ref 12.0–15.0)
MCH: 28.2 pg (ref 26.0–34.0)
MCHC: 34 g/dL (ref 30.0–36.0)
MCV: 82.9 fL (ref 80.0–100.0)
Platelets: 315 10*3/uL (ref 150–400)
RBC: 4.79 MIL/uL (ref 3.87–5.11)
RDW: 13.7 % (ref 11.5–15.5)
WBC: 10.1 10*3/uL (ref 4.0–10.5)
nRBC: 0 % (ref 0.0–0.2)

## 2023-12-10 LAB — WET PREP, GENITAL
Clue Cells Wet Prep HPF POC: NONE SEEN
Sperm: NONE SEEN
Trich, Wet Prep: NONE SEEN
WBC, Wet Prep HPF POC: <10 (ref <10)
Yeast Wet Prep HPF POC: NONE SEEN

## 2023-12-10 LAB — LIPASE, BLOOD: Lipase: 53 U/L — ABNORMAL HIGH (ref 11–51)

## 2023-12-10 LAB — PREGNANCY, URINE: Preg Test, Ur: NEGATIVE

## 2023-12-10 MED ORDER — ONDANSETRON 4 MG PO TBDP
4.0000 mg | ORAL_TABLET | Freq: Once | ORAL | Status: AC | PRN
  Administered 2023-12-10: 4 mg via ORAL
  Filled 2023-12-10: qty 1

## 2023-12-10 MED ORDER — KETOROLAC TROMETHAMINE 15 MG/ML IJ SOLN
15.0000 mg | Freq: Once | INTRAMUSCULAR | Status: AC
  Administered 2023-12-10: 15 mg via INTRAVENOUS
  Filled 2023-12-10: qty 1

## 2023-12-10 MED ORDER — MORPHINE SULFATE (PF) 4 MG/ML IV SOLN
4.0000 mg | Freq: Once | INTRAVENOUS | Status: AC
  Administered 2023-12-10: 4 mg via INTRAVENOUS
  Filled 2023-12-10: qty 1

## 2023-12-10 NOTE — ED Triage Notes (Addendum)
 Patient reports having sex last night and having pain like she has never experienced before. Reports vaginal pain and pelvic pain. Patient states she passed out from pain. Patient states she was sweating and had black spots in her vision yesterday from the pain. Denies vaginal bleeding, discharge or odor. Feels nauseous, no vomiting. Patient reports she has had the same partner for over a year. State she is not concerned about STD's.

## 2023-12-10 NOTE — ED Notes (Signed)
-  Called [facility] at 1045pm for transportation to [facility] ED - Dr. Aldean Amass accepting.

## 2023-12-10 NOTE — ED Provider Notes (Signed)
 [facility]ERGENC[facility] DEPARTMENT AT [facility] Provider Note   CSN: [id number] Arrival date & time: 12/10/23  2020     History  Chief Complaint  Patient presents with   Abdominal Pain    Alexandra Meadows is a 28 y.o. female.  This is a 28 year old female who is here today for lower abdominal pain.  Patient has a past medical history significant for ovarian cyst.  Patient says that last evening she was having intercourse, began to experience pain in her right lower quadrant.  She has felt nauseated.  Patient's last period was 17 days ago.  She denies any fever or chills.   Abdominal Pain      Home Medications Prior to Admission medications   Medication Sig Start Date End Date Taking? Authorizing Provider  albuterol  (VENTOLIN  HFA) 108 (90 Base) MCG/ACT inhaler Inhale 2 puffs into the lungs every 6 (six) hours as needed for wheezing or shortness of breath. 01/04/22   Burchette, Marijean Shouts, MD  amphetamine -dextroamphetamine  (ADDERALL) 30 MG tablet Take 1 tablet by mouth 2 (two) times daily. 09/22/23   Donley Furth, MD  amphetamine -dextroamphetamine  (ADDERALL) 30 MG tablet Take 1 tablet by mouth 2 (two) times daily. 09/22/23   Donley Furth, MD  amphetamine -dextroamphetamine  (ADDERALL) 30 MG tablet Take 1 tablet by mouth 2 (two) times daily. 09/22/23   Donley Furth, MD  Blood Glucose Monitoring Suppl DEVI 1 each by Does not apply route in the morning, at noon, and at bedtime. May substitute to any manufacturer covered by patient's insurance. 08/09/23   Wellington Half, FNP  cyclobenzaprine  (FLEXERIL ) 10 MG tablet Take 1 tablet (10 mg total) by mouth 3 (three) times daily as needed for muscle spasms. 01/18/23   Donley Furth, MD  EPINEPHrine  (EPIPEN  2-PAK) 0.3 mg/0.3 mL IJ SOAJ injection Inject 0.3 mg into the muscle as needed for anaphylaxis. 12/07/22   Donley Furth, MD  ibuprofen  (ADVIL ) 600 MG tablet Take 1 tablet (600 mg total) by mouth every 6 (six) hours as needed.  11/11/22   Debbra Fairy, PA-C  Iron , Ferrous Sulfate , 325 (65 Fe) MG TABS Take 325 mg by mouth daily. 07/18/23   Donley Furth, MD  metoCLOPramide  (REGLAN ) 5 MG tablet Take 1 tablet (5 mg total) by mouth every 8 (eight) hours as needed for vomiting (for HEADACHE). 08/12/23   Donley Furth, MD  metroNIDAZOLE  (METROGEL ) 0.75 % vaginal gel Place 1 Applicatorful vaginally 2 (two) times daily. 08/25/23   Donley Furth, MD  mupirocin  ointment (BACTROBAN ) 2 % Apply 1 Application topically 3 (three) times daily. 09/22/23   Donley Furth, MD  [name]  3-0.02 MG tablet TAKE 1 TABLET BY MOUTH DAILY 08/11/23   Donley Furth, MD  ofloxacin  (OCUFLOX ) 0.3 % ophthalmic solution Place 1 drop into the left eye 4 (four) times daily. 09/16/23   Wellington Half, FNP  rizatriptan  (MAXALT ) 10 MG tablet TAKE 1 TABLET BY MOUTH AT ONSET OF MIGRAINE; MAY REPEAT 1 TABLET IN 2 HOURS IF NEEDED. 10/07/23   Donley Furth, MD  valACYclovir  (VALTREX ) 500 MG tablet Take 1 tablet (500 mg total) by mouth daily. 11/22/22   Donley Furth, MD  Vitamin D , Ergocalciferol , (DRISDOL ) 1.25 MG (50000 UNIT) CAPS capsule TAKE 1 CAPSULE BY MOUTH ONCE WEEKLY 10/03/23   Donley Furth, MD      Allergies    Alprazolam , Bee venom, and Tetanus toxoids    Review of Systems   Review of Systems  Gastrointestinal:  Positive for abdominal pain.    Physical Exam Updated Vital Signs BP 121/81   Pulse 99   Temp 98.6 F (37 C)   Resp 15   Ht 5\' 3"  (1.6 m)   Wt 58.1 kg   LMP 11/21/2023   SpO2 100%   BMI 22.67 kg/m  Physical Exam Vitals and nursing note reviewed.  Abdominal:     General: Abdomen is flat.     Palpations: Abdomen is soft.     Tenderness: There is abdominal tenderness in the right lower quadrant.  Genitourinary:    Vagina: Normal. No signs of injury. No vaginal discharge or tenderness.     Cervix: Cervical motion tenderness present. No friability.  Neurological:     Mental Status: She is alert.     ED Results /  Procedures / Treatments   Labs (all labs ordered are listed, but only abnormal results are displayed) Labs Reviewed  LIPASE, BLOOD - Abnormal; Notable for the following components:      Result Value   Lipase 53 (*)    All other components within normal limits  COMPREHENSIVE METABOLIC PANEL WITH GFR - Abnormal; Notable for the following components:   BUN 5 (*)    All other components within normal limits  URINALYSIS, ROUTINE W REFLEX MICROSCOPIC - Abnormal; Notable for the following components:   Protein, ur TRACE (*)    All other components within normal limits  WET PREP, GENITAL  CBC  PREGNANCY, URINE  GC/CHLAMYDIA PROBE AMP ([place]) NOT AT [facility]    EKG None  Radiology No results found.  Procedures Procedures    Medications Ordered in ED Medications  ondansetron  (ZOFRAN -ODT) disintegrating tablet 4 mg (4 mg Oral Given 12/10/23 2051)  morphine  (PF) 4 MG/ML injection 4 mg (4 mg Intravenous Given 12/10/23 2117)  ketorolac  (TORADOL ) 15 MG/ML injection 15 mg (15 mg Intravenous Given 12/10/23 2116)  morphine  (PF) 4 MG/ML injection 4 mg (4 mg Intravenous Given 12/10/23 2235)    ED Course/ Medical Decision Making/ A&P                                 Medical Decision Making 28 year old female here today with right lower quadrant abdominal pain.  Differential diagnoses include torsion, ruptured ovarian cyst, less likely appendicitis.  PID.  Plan- patient with a normal pelvic exam.  Had nursing staff present for exam.  Obtained swabs.  Unfortunately, we do not have ultrasound imaging available in our ED, so I cannot fully assess for torsion.  Patient's pregnancy negative.  Will transfer patient to [facility] for transvaginal ultrasound.  Accepting physician is Dr. Aldean Amass.  Reviewed the patient's medications.  I reviewed the patient's most recent PCP note.  This patient's medical care is complicated by the following social determinants of health-lack of access to primary  care.  Amount and/or Complexity of Data Reviewed Labs: ordered. Radiology: ordered.  Risk Prescription drug management.           Final Clinical Impression(s) / ED Diagnoses Final diagnoses:  None    Rx / DC Orders ED Discharge Orders     None         Afton Horse T, DO 12/10/23 2240

## 2023-12-11 ENCOUNTER — Emergency Department (HOSPITAL_COMMUNITY)

## 2023-12-11 MED ORDER — IBUPROFEN 800 MG PO TABS
800.0000 mg | ORAL_TABLET | Freq: Once | ORAL | Status: AC
  Administered 2023-12-11: 800 mg via ORAL
  Filled 2023-12-11: qty 1

## 2023-12-11 MED ORDER — IBUPROFEN 800 MG PO TABS: 800.0000 mg | ORAL_TABLET | Freq: Three times a day (TID) | ORAL | 0 refills | Status: AC

## 2023-12-11 NOTE — ED Notes (Signed)
 Pt arrives from [facility] for US  via [facility].

## 2023-12-11 NOTE — ED Provider Notes (Signed)
 Patient transferred to [facility] for ultrasound of pelvis due to pelvic pain that started last night.  She states pains been present all day.  Pain is primarily located on the right side.  She was sent to rule out torsion.  I reassessed the patient.  She does not appear toxic or septic.  Vital signs are stable.  Ultrasound shows no evidence of torsion.  Findings are consistent with ruptured hemorrhagic cyst.  Will treat with NSAIDs.  Return precautions discussed.  Patient is stable for discharge.   Sherel Dikes, PA-C 12/11/23 0117    Lindle Rhea, MD 12/11/23 [phone]

## 2023-12-11 NOTE — Discharge Instructions (Addendum)
 Your ultrasound was consistent with a ruptured hemorrhagic cyst.  You can take anti-inflammatories.  You can apply warm compresses or cool compresses.  Please return for new or worsening symptoms.

## 2023-12-11 NOTE — ED Notes (Signed)
US tech at the bedside

## 2023-12-12 LAB — GC/CHLAMYDIA PROBE AMP (~~LOC~~) NOT AT ARMC
Chlamydia: NEGATIVE
Comment: NEGATIVE
Comment: NORMAL
Neisseria Gonorrhea: NEGATIVE

## 2023-12-14 ENCOUNTER — Other Ambulatory Visit: Payer: Self-pay | Admitting: Family Medicine

## 2023-12-25 ENCOUNTER — Other Ambulatory Visit: Payer: Self-pay | Admitting: Family Medicine

## 2023-12-25 DIAGNOSIS — G43009 Migraine without aura, not intractable, without status migrainosus: Secondary | ICD-10-CM

## 2024-01-03 ENCOUNTER — Telehealth (INDEPENDENT_AMBULATORY_CARE_PROVIDER_SITE_OTHER): Admitting: Family Medicine

## 2024-01-03 ENCOUNTER — Encounter: Payer: Self-pay | Admitting: Family Medicine

## 2024-01-03 VITALS — Wt 128.0 lb

## 2024-01-03 DIAGNOSIS — J019 Acute sinusitis, unspecified: Secondary | ICD-10-CM | POA: Diagnosis not present

## 2024-01-03 MED ORDER — AZITHROMYCIN 250 MG PO TABS: ORAL_TABLET | ORAL | 0 refills | Status: DC

## 2024-01-03 MED ORDER — METHYLPREDNISOLONE 4 MG PO TBPK: ORAL_TABLET | ORAL | 0 refills | Status: DC
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
## 2024-01-03 NOTE — Progress Notes (Signed)
 Subjective:    Patient ID: Alexandra Meadows, female    DOB: 1996-07-23, 28 y.o.   MRN: [id number]  HPI Virtual Visit via Video Note  I connected with the patient on 01/03/24 at  2:00 PM EDT by a video enabled telemedicine application and verified that I am speaking with the correct person using two identifiers.  Location patient: home Location provider:work or home office Persons participating in the virtual visit: patient, provider  I discussed the limitations of evaluation and management by telemedicine and the availability of in person appointments. The patient expressed understanding and agreed to proceed.   HPI: For the past 3 weeks she has had sinus pressure, headaches, PND, ST, and dizziness. No cough or fever. Taking Benadryl .   ROS: See pertinent positives and negatives per HPI.  Past Medical History:  Diagnosis Date   Anemia    Anxiety    Asthma    prn inhaler; exercise induced   Carpal tunnel syndrome    Depression    severe at 20weeks   History of seizures    states caused by Xanax  - no seizures since 2015   Infection    BV with preg; recurrent yeast inf   Migraines    MRSA (methicillin resistant staph aureus) culture positive    Seasonal allergies    Tonsillar hypertrophy 04/2016    Past Surgical History:  Procedure Laterality Date   CARPAL TUNNEL RELEASE Bilateral    2022   KNEE ARTHROSCOPY Left 02/28/2014   Procedure: ARTHROSCOPY LEFT KNEE WITH SYNOVECTOMY LIMITED, PARTIAL LATERAL MENISECTOMY;  Surgeon: Ferd Householder, MD;  Location: [facility];  Service: Orthopedics;  Laterality: Left;   TONSILLECTOMY AND ADENOIDECTOMY Bilateral 05/25/2016   Procedure: TONSILLECTOMY AND ADENOIDECTOMY;  Surgeon: Reynold Caves, MD;  Location: [facility];  Service: ENT;  Laterality: Bilateral;   WISDOM TOOTH EXTRACTION  2015    Family History  Problem Relation Age of Onset   Diabetes Mother    Cancer Maternal Grandfather      Current  Outpatient Medications:    albuterol  (VENTOLIN  HFA) 108 (90 Base) MCG/ACT inhaler, Inhale 2 puffs into the lungs every 6 (six) hours as needed for wheezing or shortness of breath., Disp: 8 g, Rfl: 0   amphetamine -dextroamphetamine  (ADDERALL) 30 MG tablet, Take 1 tablet by mouth 2 (two) times daily., Disp: 60 tablet, Rfl: 0   amphetamine -dextroamphetamine  (ADDERALL) 30 MG tablet, Take 1 tablet by mouth 2 (two) times daily., Disp: 60 tablet, Rfl: 0   amphetamine -dextroamphetamine  (ADDERALL) 30 MG tablet, Take 1 tablet by mouth 2 (two) times daily., Disp: 60 tablet, Rfl: 0   Blood Glucose Monitoring Suppl DEVI, 1 each by Does not apply route in the morning, at noon, and at bedtime. May substitute to any manufacturer covered by patient's insurance., Disp: 1 each, Rfl: 0   cyclobenzaprine  (FLEXERIL ) 10 MG tablet, Take 1 tablet (10 mg total) by mouth 3 (three) times daily as needed for muscle spasms., Disp: 60 tablet, Rfl: 5   drospirenone -ethinyl estradiol  ([name] ) 3-0.02 MG tablet, TAKE 1 TABLET BY MOUTH DAILY, Disp: 28 tablet, Rfl: 0   EPINEPHrine  (EPIPEN  2-PAK) 0.3 mg/0.3 mL IJ SOAJ injection, Inject 0.3 mg into the muscle as needed for anaphylaxis., Disp: 1 each, Rfl: 2   ibuprofen  (ADVIL ) 800 MG tablet, Take 1 tablet (800 mg total) by mouth 3 (three) times daily., Disp: 21 tablet, Rfl: 0   Iron , Ferrous Sulfate , 325 (65 Fe) MG TABS, Take 325 mg by mouth daily.,  Disp: 90 tablet, Rfl: 3   metoCLOPramide  (REGLAN ) 5 MG tablet, Take 1 tablet (5 mg total) by mouth every 8 (eight) hours as needed for vomiting (for HEADACHE)., Disp: 60 tablet, Rfl: 2   metroNIDAZOLE  (METROGEL ) 0.75 % vaginal gel, Place 1 Applicatorful vaginally 2 (two) times daily., Disp: 70 g, Rfl: 2   mupirocin  ointment (BACTROBAN ) 2 %, Apply 1 Application topically 3 (three) times daily., Disp: 22 g, Rfl: 1   ofloxacin  (OCUFLOX ) 0.3 % ophthalmic solution, Place 1 drop into the left eye 4 (four) times daily., Disp: 5 mL, Rfl: 0   rizatriptan   (MAXALT ) 10 MG tablet, TAKE 1 TABLET BY MOUTH AT ONSET OF MIGRAINE; MAY REPEAT 1 TABLET IN 2 HOURS IF NEEDED., Disp: 10 tablet, Rfl: 1   valACYclovir  (VALTREX ) 500 MG tablet, Take 1 tablet (500 mg total) by mouth daily., Disp: 90 tablet, Rfl: 3   Vitamin D , Ergocalciferol , (DRISDOL ) 1.25 MG (50000 UNIT) CAPS capsule, TAKE 1 CAPSULE BY MOUTH ONCE WEEKLY, Disp: 12 capsule, Rfl: 3  EXAM:  VITALS per patient if applicable:  GENERAL: alert, oriented, appears well and in no acute distress  HEENT: atraumatic, conjunttiva clear, no obvious abnormalities on inspection of external nose and ears  NECK: normal movements of the head and neck  LUNGS: on inspection no signs of respiratory distress, breathing rate appears normal, no obvious gross SOB, gasping or wheezing  CV: no obvious cyanosis  MS: moves all visible extremities without noticeable abnormality  PSYCH/NEURO: pleasant and cooperative, no obvious depression or anxiety, speech and thought processing grossly intact  ASSESSMENT AND PLAN: Sinusitis, treat with a Zpack and a Medrol  dose pack. Corita Diego, MD  Discussed the following assessment and plan:  No diagnosis found.     I discussed the assessment and treatment plan with the patient. The patient was provided an opportunity to ask questions and all were answered. The patient agreed with the plan and demonstrated an understanding of the instructions.   The patient was advised to call back or seek an in-person evaluation if the symptoms worsen or if the condition fails to improve as anticipated.      Review of Systems     Objective:   Physical Exam        Assessment & Plan:

## 2024-01-12 ENCOUNTER — Encounter: Payer: Self-pay | Admitting: Neurology

## 2024-01-12 ENCOUNTER — Ambulatory Visit: Admitting: Neurology

## 2024-01-12 VITALS — BP 111/74 | HR 78 | Ht 63.0 in | Wt 129.0 lb

## 2024-01-12 DIAGNOSIS — G8929 Other chronic pain: Secondary | ICD-10-CM | POA: Diagnosis not present

## 2024-01-12 DIAGNOSIS — R202 Paresthesia of skin: Secondary | ICD-10-CM

## 2024-01-12 DIAGNOSIS — E538 Deficiency of other specified B group vitamins: Secondary | ICD-10-CM

## 2024-01-12 DIAGNOSIS — E559 Vitamin D deficiency, unspecified: Secondary | ICD-10-CM

## 2024-01-12 DIAGNOSIS — R200 Anesthesia of skin: Secondary | ICD-10-CM

## 2024-01-12 DIAGNOSIS — E611 Iron deficiency: Secondary | ICD-10-CM

## 2024-01-12 DIAGNOSIS — M5442 Lumbago with sciatica, left side: Secondary | ICD-10-CM | POA: Diagnosis not present

## 2024-01-12 NOTE — Patient Instructions (Addendum)
 I saw you today for the back pain and numbness in your left leg. I'm not sure how or if this is related to your bladder, but would like to investigate further with an EMG of the left leg. We will do this on Tuesday, 01/17/24 at 8 am. Stop at the front to get on my schedule. We will discuss next steps after the EMG.  I want you to take some supplements as these levels were low: -B12 1000 mcg daily -Vit D 1000 international units -Continue iron  supplementation  I will try to get your latest MRI results from [facility].  Please let me know if you have any questions or concerns in the meantime.   The physicians and staff at [facility] are committed to providing excellent care. You may receive a survey requesting feedback about your experience at our office. We strive to receive very good responses to the survey questions. If you feel that your experience would prevent you from giving the office a very good  response, please contact our office to try to remedy the situation. We may be reached at [phone]. Thank you for taking the time out of your busy day to complete the survey.  Rommie Coats, MD [facility]

## 2024-01-16 ENCOUNTER — Other Ambulatory Visit: Payer: Self-pay | Admitting: Family Medicine

## 2024-01-17 ENCOUNTER — Other Ambulatory Visit: Payer: Self-pay

## 2024-01-17 ENCOUNTER — Telehealth: Payer: Self-pay | Admitting: Neurology

## 2024-01-17 ENCOUNTER — Ambulatory Visit: Admitting: Neurology

## 2024-01-17 DIAGNOSIS — R200 Anesthesia of skin: Secondary | ICD-10-CM

## 2024-01-17 DIAGNOSIS — M5442 Lumbago with sciatica, left side: Secondary | ICD-10-CM | POA: Diagnosis not present

## 2024-01-17 NOTE — Telephone Encounter (Signed)
 Discussed the results of patient's EMG after the procedure today. EMG of LLE was normal. I discussed with patient that I do not have a clear neurologic reason for her symptoms. To fully rule out a neurologic cause, MRI brain could be considered to rule out causes such as demyelinating disease. Patient would like to have this work up. I will order MRI brain w/wo contrast.  All questions were answered.  Venetia Potters, MD [facility]

## 2024-01-17 NOTE — Telephone Encounter (Signed)
 Pt. Would like to set up ultra sound per convo she had with Dr. Leigh

## 2024-01-17 NOTE — Procedures (Signed)
  [facility]  [address] [place], [address]  [place], [place] [address] Tel: [phone] Fax: [phone] Test Date:  01/17/2024  Patient: Alexandra Meadows DOB: Jun 12, 1996 Physician: Venetia Potters, MD  Sex: Female Height: 5' 3 Ref Phys: Venetia Potters, MD  ID#: [id number]   Technician:    History: This is a 28 year old female with back pain and left lower limb numbness.  NCV & EMG Findings: Extensive electrodiagnostic evaluation of the left lower limb shows: Left sural and superficial peroneal/fibular sensory responses are within normal limits. Left peroneal/fibular (EDB), peroneal/fibular (TA), and tibial (AH) motor responses are within normal limits. Left H reflex latency is within normal limits. There is no evidence of active or chronic motor axon loss changes affecting any of the tested muscles. Motor unit configuration and recruitment pattern is within normal limits.  Impression: This is a normal study of the left lower limb. In particular, there is no electrodiagnostic evidence of a left lumbosacral (L3-S1) radiculopathy, large fiber sensorimotor neuropathy, or myopathy.     ___________________________ Venetia Potters, MD    Nerve Conduction Studies Motor Nerve Results    Latency Amplitude F-Lat Segment Distance CV Comment  Site (ms) Norm (mV) Norm (ms)  (cm) (m/s) Norm   Left Fibular (EDB) Motor  Ankle 3.8  < 5.5 3.1  > 3.0        Bel fib head 9.7 - 3.1 -  Bel fib head-Ankle 29 49  > 41   Pop fossa 11.5 - 3.0 -  Pop fossa-Bel fib head 9 50 -   Left Fibular (TA) Motor  Fib head 1.48  < 4.0 5.5  > 4.0        Pop fossa 2.7  < 6.7 4.7 -  Pop fossa-Fib head 8 66  > 41   Left Tibial (AH) Motor  Ankle 3.2  < 5.8 14.3  > 8.0        Knee 11.0 - 11.9 -  Knee-Ankle 36 46  > 41    Sensory Sites    Neg Peak Lat Amplitude (O-P) Segment Distance Velocity Comment  Site (ms) Norm (V) Norm  (cm) (ms)   Left Superficial Fibular Sensory  14 cm-Ankle 3.0  < 4.4 9  > 6 14  cm-Ankle 14    Left Sural Sensory  Calf-Lat mall 4.0  < 4.4 10  > 6 Calf-Lat mall 14     H-Reflex Results    M-Lat H Lat H Neg Amp H-M Lat  Site (ms) (ms) Norm (mV) (ms)  Left Tibial H-Reflex  Pop fossa 4.5 27.0  < 35.0 4.5 22.5   Electromyography   Side Muscle Ins.Act Fibs Fasc Recrt Amp Dur Poly Activation Comment  Left Tib ant Nml Nml Nml Nml Nml Nml Nml Nml N/A  Left Gastroc MH Nml Nml Nml Nml Nml Nml Nml Nml N/A  Left FDL Nml Nml Nml Nml Nml Nml Nml Nml N/A  Left Rectus fem Nml Nml Nml Nml Nml Nml Nml Nml N/A  Left Biceps fem SH Nml Nml Nml Nml Nml Nml Nml Nml N/A  Left Gluteus med Nml Nml Nml Nml Nml Nml Nml Nml N/A  Left Lumbar PSP lower Nml Nml Nml Nml Nml Nml Nml Nml N/A      Waveforms:  Motor        Sensory      H-Reflex

## 2024-01-17 NOTE — Telephone Encounter (Signed)
 Pt called in wanting to see if she can go ahead and schedule the ultrasound and asked if Dr. Leigh could add her neck into the MRI order?

## 2024-01-18 ENCOUNTER — Other Ambulatory Visit: Payer: Self-pay

## 2024-01-18 ENCOUNTER — Encounter: Payer: Self-pay | Admitting: Neurology

## 2024-01-18 DIAGNOSIS — G8929 Other chronic pain: Secondary | ICD-10-CM

## 2024-01-18 DIAGNOSIS — R200 Anesthesia of skin: Secondary | ICD-10-CM

## 2024-01-18 DIAGNOSIS — S0990XS Unspecified injury of head, sequela: Secondary | ICD-10-CM

## 2024-01-18 NOTE — Telephone Encounter (Signed)
 Called pt and  reported Dr. Venus okay to add the MRI CS with and with out to her orders. She understood

## 2024-01-30 ENCOUNTER — Other Ambulatory Visit

## 2024-01-30 ENCOUNTER — Inpatient Hospital Stay: Admission: RE | Admit: 2024-01-30 | Source: Ambulatory Visit

## 2024-02-02 ENCOUNTER — Telehealth: Admitting: Family Medicine

## 2024-02-02 ENCOUNTER — Encounter: Payer: Self-pay | Admitting: Family Medicine

## 2024-02-02 DIAGNOSIS — F900 Attention-deficit hyperactivity disorder, predominantly inattentive type: Secondary | ICD-10-CM

## 2024-02-02 DIAGNOSIS — F419 Anxiety disorder, unspecified: Secondary | ICD-10-CM

## 2024-02-02 DIAGNOSIS — F32A Depression, unspecified: Secondary | ICD-10-CM | POA: Diagnosis not present

## 2024-02-02 DIAGNOSIS — G4700 Insomnia, unspecified: Secondary | ICD-10-CM

## 2024-02-02 MED ORDER — BUPROPION HCL ER (SR) 150 MG PO TB12: 150.0000 mg | ORAL_TABLET | Freq: Every morning | ORAL | 2 refills | Status: DC

## 2024-02-02 MED ORDER — AMPHETAMINE-DEXTROAMPHETAMINE 30 MG PO TABS: 30.0000 mg | ORAL_TABLET | Freq: Two times a day (BID) | ORAL | 0 refills | Status: DC

## 2024-02-02 NOTE — Progress Notes (Signed)
 Subjective:    Patient ID: Alexandra Meadows, female    DOB: 1996/03/14, 28 y.o.   MRN: [id number]  HPI Virtual Visit via Video Note  I connected with the patient on 02/02/24 at  9:30 AM EDT by a video enabled telemedicine application and verified that I am speaking with the correct person using two identifiers.  Location patient: home Location provider:work or home office Persons participating in the virtual visit: patient, provider  I discussed the limitations of evaluation and management by telemedicine and the availability of in person appointments. The patient expressed understanding and agreed to proceed.   HPI: Here to discuss her medications. She has been taking Adderall twice daily for years, and it works well for her. She normally takes the first as soon as she wakes up and the second dose around noon. Then we started her on Wellbutrin  XL for anxiety and depression, and she has been very pleased with how this is helping her moods. She takes this first thing in the mornings. However ovet he past few weeks she has had trouble sleeping, and over the past few nights she has slept very little. She thinks this may be from an interaction between the two medications. She would to avoid taking a sleep medication if possible.    ROS: See pertinent positives and negatives per HPI.  Past Medical History:  Diagnosis Date   Anemia    Anxiety    Asthma    prn inhaler; exercise induced   Carpal tunnel syndrome    Depression    severe at 20weeks   History of seizures    states caused by Xanax  - no seizures since 2015   Infection    BV with preg; recurrent yeast inf   Migraines    MRSA (methicillin resistant staph aureus) culture positive    Seasonal allergies    Tonsillar hypertrophy 04/2016    Past Surgical History:  Procedure Laterality Date   CARPAL TUNNEL RELEASE Bilateral    2022   KNEE ARTHROSCOPY Left 02/28/2014   Procedure: ARTHROSCOPY LEFT KNEE WITH SYNOVECTOMY  LIMITED, PARTIAL LATERAL MENISECTOMY;  Surgeon: Toribio JULIANNA Chancy, MD;  Location: [facility];  Service: Orthopedics;  Laterality: Left;   TONSILLECTOMY AND ADENOIDECTOMY Bilateral 05/25/2016   Procedure: TONSILLECTOMY AND ADENOIDECTOMY;  Surgeon: Daniel Moccasin, MD;  Location: [facility];  Service: ENT;  Laterality: Bilateral;   WISDOM TOOTH EXTRACTION  2015    Family History  Problem Relation Age of Onset   Diabetes Mother    Cancer Maternal Grandfather      Current Outpatient Medications:    buPROPion  (WELLBUTRIN  SR) 150 MG 12 hr tablet, Take 1 tablet (150 mg total) by mouth every morning., Disp: 30 tablet, Rfl: 2   albuterol  (VENTOLIN  HFA) 108 (90 Base) MCG/ACT inhaler, Inhale 2 puffs into the lungs every 6 (six) hours as needed for wheezing or shortness of breath., Disp: 8 g, Rfl: 0   amphetamine -dextroamphetamine  (ADDERALL) 30 MG tablet, Take 1 tablet by mouth 2 (two) times daily., Disp: 60 tablet, Rfl: 0   amphetamine -dextroamphetamine  (ADDERALL) 30 MG tablet, Take 1 tablet by mouth 2 (two) times daily., Disp: 60 tablet, Rfl: 0   amphetamine -dextroamphetamine  (ADDERALL) 30 MG tablet, Take 1 tablet by mouth 2 (two) times daily., Disp: 60 tablet, Rfl: 0   azithromycin  (ZITHROMAX  Z-PAK) 250 MG tablet, As directed, Disp: 6 each, Rfl: 0   Blood Glucose Monitoring Suppl DEVI, 1 each by Does not apply route in the morning,  at noon, and at bedtime. May substitute to any manufacturer covered by patient's insurance., Disp: 1 each, Rfl: 0   cyclobenzaprine  (FLEXERIL ) 10 MG tablet, Take 1 tablet (10 mg total) by mouth 3 (three) times daily as needed for muscle spasms., Disp: 60 tablet, Rfl: 5   drospirenone -ethinyl estradiol  ([name] ) 3-0.02 MG tablet, TAKE 1 TABLET BY MOUTH DAILY, Disp: 28 tablet, Rfl: 11   EPINEPHrine  (EPIPEN  2-PAK) 0.3 mg/0.3 mL IJ SOAJ injection, Inject 0.3 mg into the muscle as needed for anaphylaxis., Disp: 1 each, Rfl: 2   ibuprofen  (ADVIL ) 800 MG tablet,  Take 1 tablet (800 mg total) by mouth 3 (three) times daily., Disp: 21 tablet, Rfl: 0   Iron , Ferrous Sulfate , 325 (65 Fe) MG TABS, Take 325 mg by mouth daily., Disp: 90 tablet, Rfl: 3   methylPREDNISolone  (MEDROL  DOSEPAK) 4 MG TBPK tablet, As directed, Disp: 21 tablet, Rfl: 0   metoCLOPramide  (REGLAN ) 5 MG tablet, Take 1 tablet (5 mg total) by mouth every 8 (eight) hours as needed for vomiting (for HEADACHE)., Disp: 60 tablet, Rfl: 2   metroNIDAZOLE  (METROGEL ) 0.75 % vaginal gel, Place 1 Applicatorful vaginally 2 (two) times daily., Disp: 70 g, Rfl: 2   mupirocin  ointment (BACTROBAN ) 2 %, Apply 1 Application topically 3 (three) times daily., Disp: 22 g, Rfl: 1   ofloxacin  (OCUFLOX ) 0.3 % ophthalmic solution, Place 1 drop into the left eye 4 (four) times daily., Disp: 5 mL, Rfl: 0   rizatriptan  (MAXALT ) 10 MG tablet, TAKE 1 TABLET BY MOUTH AT ONSET OF MIGRAINE; MAY REPEAT 1 TABLET IN 2 HOURS IF NEEDED., Disp: 10 tablet, Rfl: 1   valACYclovir  (VALTREX ) 500 MG tablet, Take 1 tablet (500 mg total) by mouth daily., Disp: 90 tablet, Rfl: 3   Vitamin D , Ergocalciferol , (DRISDOL ) 1.25 MG (50000 UNIT) CAPS capsule, TAKE 1 CAPSULE BY MOUTH ONCE WEEKLY, Disp: 12 capsule, Rfl: 3  EXAM:  VITALS per patient if applicable:  GENERAL: alert, oriented, appears well and in no acute distress  HEENT: atraumatic, conjunttiva clear, no obvious abnormalities on inspection of external nose and ears  NECK: normal movements of the head and neck  LUNGS: on inspection no signs of respiratory distress, breathing rate appears normal, no obvious gross SOB, gasping or wheezing  CV: no obvious cyanosis  MS: moves all visible extremities without noticeable abnormality  PSYCH/NEURO: pleasant and cooperative, no obvious depression or anxiety, speech and thought processing grossly intact  ASSESSMENT AND PLAN: Her ADHD is well controlled, and she will stay on Adderall BID. However she will now wait until she gets to work to  take the first dose. As for the Wellbutrin , we will stop the XL and try the immediate release version, and she will take this only once a day when she gets up. She will follow up in one month. Garnette Olmsted, MD  Discussed the following assessment and plan:  No diagnosis found.     I discussed the assessment and treatment plan with the patient. The patient was provided an opportunity to ask questions and all were answered. The patient agreed with the plan and demonstrated an understanding of the instructions.   The patient was advised to call back or seek an in-person evaluation if the symptoms worsen or if the condition fails to improve as anticipated.      Review of Systems     Objective:   Physical Exam        Assessment & Plan:

## 2024-02-10 ENCOUNTER — Inpatient Hospital Stay: Admission: RE | Admit: 2024-02-10 | Source: Ambulatory Visit

## 2024-02-10 ENCOUNTER — Other Ambulatory Visit

## 2024-02-24 ENCOUNTER — Inpatient Hospital Stay: Admission: RE | Admit: 2024-02-24 | Source: Ambulatory Visit

## 2024-03-15 ENCOUNTER — Telehealth: Admitting: Family Medicine

## 2024-03-15 ENCOUNTER — Encounter: Payer: Self-pay | Admitting: Family Medicine

## 2024-03-15 DIAGNOSIS — Z3A01 Less than 8 weeks gestation of pregnancy: Secondary | ICD-10-CM

## 2024-03-15 DIAGNOSIS — F32A Depression, unspecified: Secondary | ICD-10-CM

## 2024-03-15 DIAGNOSIS — F419 Anxiety disorder, unspecified: Secondary | ICD-10-CM

## 2024-03-15 MED ORDER — BUPROPION HCL ER (XL) 150 MG PO TB24: 150.0000 mg | ORAL_TABLET | Freq: Every day | ORAL | 5 refills | Status: AC

## 2024-03-15 NOTE — Progress Notes (Signed)
 Subjective:    Patient ID: Alexandra Meadows, female    DOB: 1996/02/22, 28 y.o.   MRN: [id number]  HPI Virtual Visit via Video Note  I connected with the patient on 03/15/24 at  9:45 AM EDT by a video enabled telemedicine application and verified that I am speaking with the correct person using two identifiers.  Location patient: home Location provider:work or home office Persons participating in the virtual visit: patient, provider  I discussed the limitations of evaluation and management by telemedicine and the availability of in person appointments. The patient expressed understanding and agreed to proceed.   HPI: Here for 2 issues. First she asks for advice about terminating her pregnancy. She has been taking a BCP consistently for some time now, but she noticed that her  last menstrual cycle (from 02-11-24 to 02-16-24) was longer than usual. She took a home pregnancy test on 03-13-24 and this was positive. She says this was not intended, and she has decided to get an abortion. She says that emotionally and financially she is not ready to have another child. She asks if we can prescribe Mifepristone to accomplish this. She has spoken to [employer], but they told her that Medicaid does not cover abortions, and the cost for her to pay them out of pocket was too high. She does have an appt with [facility] on 03-23-24. The other issue is her anxiety and depression. A few weeks ago we decided she would change from extended release Wellbutrin  to immediate release, but she says this makes her feel bad. She wants to go back to the XL version.   ROS: See pertinent positives and negatives per HPI.  Past Medical History:  Diagnosis Date   Anemia    Anxiety    Asthma    prn inhaler; exercise induced   Carpal tunnel syndrome    Depression    severe at 20weeks   History of seizures    states caused by Xanax  - no seizures since 2015   Infection    BV with preg; recurrent yeast inf    Migraines    MRSA (methicillin resistant staph aureus) culture positive    Seasonal allergies    Tonsillar hypertrophy 04/2016    Past Surgical History:  Procedure Laterality Date   CARPAL TUNNEL RELEASE Bilateral    2022   KNEE ARTHROSCOPY Left 02/28/2014   Procedure: ARTHROSCOPY LEFT KNEE WITH SYNOVECTOMY LIMITED, PARTIAL LATERAL MENISECTOMY;  Surgeon: Toribio JULIANNA Chancy, MD;  Location: [facility];  Service: Orthopedics;  Laterality: Left;   TONSILLECTOMY AND ADENOIDECTOMY Bilateral 05/25/2016   Procedure: TONSILLECTOMY AND ADENOIDECTOMY;  Surgeon: Daniel Moccasin, MD;  [facility];  Service: ENT;  Laterality: Bilateral;   WISDOM TOOTH EXTRACTION  2015    Family History  Problem Relation Age of Onset   Diabetes Mother    Cancer Maternal Grandfather      Current Outpatient Medications:    albuterol  (VENTOLIN  HFA) 108 (90 Base) MCG/ACT inhaler, Inhale 2 puffs into the lungs every 6 (six) hours as needed for wheezing or shortness of breath., Disp: 8 g, Rfl: 0   amphetamine -dextroamphetamine  (ADDERALL) 30 MG tablet, Take 1 tablet by mouth 2 (two) times daily., Disp: 60 tablet, Rfl: 0   amphetamine -dextroamphetamine  (ADDERALL) 30 MG tablet, Take 1 tablet by mouth 2 (two) times daily., Disp: 60 tablet, Rfl: 0   amphetamine -dextroamphetamine  (ADDERALL) 30 MG tablet, Take 1 tablet by mouth 2 (two) times daily., Disp: 60 tablet, Rfl:  0   azithromycin  (ZITHROMAX  Z-PAK) 250 MG tablet, As directed, Disp: 6 each, Rfl: 0   Blood Glucose Monitoring Suppl DEVI, 1 each by Does not apply route in the morning, at noon, and at bedtime. May substitute to any manufacturer covered by patient's insurance., Disp: 1 each, Rfl: 0   buPROPion  (WELLBUTRIN  SR) 150 MG 12 hr tablet, Take 1 tablet (150 mg total) by mouth every morning., Disp: 30 tablet, Rfl: 2   cyclobenzaprine  (FLEXERIL ) 10 MG tablet, Take 1 tablet (10 mg total) by mouth 3 (three) times daily as needed for muscle  spasms., Disp: 60 tablet, Rfl: 5   drospirenone -ethinyl estradiol  ([name] ) 3-0.02 MG tablet, TAKE 1 TABLET BY MOUTH DAILY, Disp: 28 tablet, Rfl: 11   EPINEPHrine  (EPIPEN  2-PAK) 0.3 mg/0.3 mL IJ SOAJ injection, Inject 0.3 mg into the muscle as needed for anaphylaxis., Disp: 1 each, Rfl: 2   ibuprofen  (ADVIL ) 800 MG tablet, Take 1 tablet (800 mg total) by mouth 3 (three) times daily., Disp: 21 tablet, Rfl: 0   Iron , Ferrous Sulfate , 325 (65 Fe) MG TABS, Take 325 mg by mouth daily., Disp: 90 tablet, Rfl: 3   metoCLOPramide  (REGLAN ) 5 MG tablet, Take 1 tablet (5 mg total) by mouth every 8 (eight) hours as needed for vomiting (for HEADACHE)., Disp: 60 tablet, Rfl: 2   metroNIDAZOLE  (METROGEL ) 0.75 % vaginal gel, Place 1 Applicatorful vaginally 2 (two) times daily., Disp: 70 g, Rfl: 2   mupirocin  ointment (BACTROBAN ) 2 %, Apply 1 Application topically 3 (three) times daily., Disp: 22 g, Rfl: 1   ofloxacin  (OCUFLOX ) 0.3 % ophthalmic solution, Place 1 drop into the left eye 4 (four) times daily., Disp: 5 mL, Rfl: 0   rizatriptan  (MAXALT ) 10 MG tablet, TAKE 1 TABLET BY MOUTH AT ONSET OF MIGRAINE; MAY REPEAT 1 TABLET IN 2 HOURS IF NEEDED., Disp: 10 tablet, Rfl: 1   valACYclovir  (VALTREX ) 500 MG tablet, Take 1 tablet (500 mg total) by mouth daily., Disp: 90 tablet, Rfl: 3   Vitamin D , Ergocalciferol , (DRISDOL ) 1.25 MG (50000 UNIT) CAPS capsule, TAKE 1 CAPSULE BY MOUTH ONCE WEEKLY, Disp: 12 capsule, Rfl: 3  EXAM:  VITALS per patient if applicable:  GENERAL: alert, oriented, appears well and in no acute distress  HEENT: atraumatic, conjunttiva clear, no obvious abnormalities on inspection of external nose and ears  NECK: normal movements of the head and neck  LUNGS: on inspection no signs of respiratory distress, breathing rate appears normal, no obvious gross SOB, gasping or wheezing  CV: no obvious cyanosis  MS: moves all visible extremities without noticeable abnormality  PSYCH/NEURO: pleasant and  cooperative, no obvious depression or anxiety, speech and thought processing grossly intact  ASSESSMENT AND PLAN: For the anxiety and depression, we will change the Wellbutrin  back to XL 150 mg daily. As far as the unintended pregnancy, I advised her to keep the appt with [facility] next week. 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  Garnette Olmsted, MD  Discussed the following assessment and plan:  No diagnosis found.     I discussed the assessment and treatment plan with the patient. The patient was provided an opportunity to ask questions and all were answered. The patient agreed with the plan and demonstrated an understanding of the instructions.   The patient was advised to call back or seek an in-person evaluation if the symptoms worsen or if the condition fails to improve as anticipated.      Review of  Systems     Objective:   Physical Exam        Assessment & Plan:

## 2024-04-06 ENCOUNTER — Ambulatory Visit: Admitting: Family Medicine

## 2024-04-26 ENCOUNTER — Telehealth (INDEPENDENT_AMBULATORY_CARE_PROVIDER_SITE_OTHER): Admitting: Family Medicine

## 2024-04-26 ENCOUNTER — Encounter: Payer: Self-pay | Admitting: Family Medicine

## 2024-04-26 DIAGNOSIS — J029 Acute pharyngitis, unspecified: Secondary | ICD-10-CM | POA: Diagnosis not present

## 2024-04-26 MED ORDER — CEFUROXIME AXETIL 500 MG PO TABS: 500.0000 mg | ORAL_TABLET | Freq: Two times a day (BID) | ORAL | 0 refills | Status: AC

## 2024-04-26 MED ORDER — METHYLPREDNISOLONE 4 MG PO TBPK: ORAL_TABLET | ORAL | 0 refills | Status: DC

## 2024-04-26 NOTE — Progress Notes (Signed)
 Subjective:    Patient ID: Alexandra Meadows, female    DOB: 07-03-1996, 28 y.o.   MRN: [id number]  HPI Virtual Visit via Video Note  I connected with the patient on 04/26/24 at  8:30 AM EDT by a video enabled telemedicine application and verified that I am speaking with the correct person using two identifiers.  Location patient: home Location provider:work or home office Persons participating in the virtual visit: patient, provider  I discussed the limitations of evaluation and management by telemedicine and the availability of in person appointments. The patient expressed understanding and agreed to proceed.   HPI: Here for 5 days of a bad ST, headache, and a dry cough. No fever or SOB. Taking Nyquil and Ibuprofen .   ROS: See pertinent positives and negatives per HPI.  Past Medical History:  Diagnosis Date   Anemia    Anxiety    Asthma    prn inhaler; exercise induced   Carpal tunnel syndrome    Depression    severe at 20weeks   History of seizures    states caused by Xanax  - no seizures since 2015   Infection    BV with preg; recurrent yeast inf   Migraines    MRSA (methicillin resistant staph aureus) culture positive    Seasonal allergies    Tonsillar hypertrophy 04/2016    Past Surgical History:  Procedure Laterality Date   CARPAL TUNNEL RELEASE Bilateral    2022   KNEE ARTHROSCOPY Left 02/28/2014   Procedure: ARTHROSCOPY LEFT KNEE WITH SYNOVECTOMY LIMITED, PARTIAL LATERAL MENISECTOMY;  Surgeon: Toribio JULIANNA Chancy, MD;  Location: [facility];  Service: Orthopedics;  Laterality: Left;   TONSILLECTOMY AND ADENOIDECTOMY Bilateral 05/25/2016   Procedure: TONSILLECTOMY AND ADENOIDECTOMY;  Surgeon: Daniel Moccasin, MD;  Location: [facility];  Service: ENT;  Laterality: Bilateral;   WISDOM TOOTH EXTRACTION  2015    Family History  Problem Relation Age of Onset   Diabetes Mother    Cancer Maternal Grandfather      Current Outpatient  Medications:    albuterol  (VENTOLIN  HFA) 108 (90 Base) MCG/ACT inhaler, Inhale 2 puffs into the lungs every 6 (six) hours as needed for wheezing or shortness of breath., Disp: 8 g, Rfl: 0   amphetamine -dextroamphetamine  (ADDERALL) 30 MG tablet, Take 1 tablet by mouth 2 (two) times daily., Disp: 60 tablet, Rfl: 0   amphetamine -dextroamphetamine  (ADDERALL) 30 MG tablet, Take 1 tablet by mouth 2 (two) times daily., Disp: 60 tablet, Rfl: 0   amphetamine -dextroamphetamine  (ADDERALL) 30 MG tablet, Take 1 tablet by mouth 2 (two) times daily., Disp: 60 tablet, Rfl: 0   Blood Glucose Monitoring Suppl DEVI, 1 each by Does not apply route in the morning, at noon, and at bedtime. May substitute to any manufacturer covered by patient's insurance., Disp: 1 each, Rfl: 0   buPROPion  (WELLBUTRIN  XL) 150 MG 24 hr tablet, Take 1 tablet (150 mg total) by mouth daily., Disp: 30 tablet, Rfl: 5   cyclobenzaprine  (FLEXERIL ) 10 MG tablet, Take 1 tablet (10 mg total) by mouth 3 (three) times daily as needed for muscle spasms., Disp: 60 tablet, Rfl: 5   drospirenone -ethinyl estradiol  ([name] ) 3-0.02 MG tablet, TAKE 1 TABLET BY MOUTH DAILY, Disp: 28 tablet, Rfl: 11   EPINEPHrine  (EPIPEN  2-PAK) 0.3 mg/0.3 mL IJ SOAJ injection, Inject 0.3 mg into the muscle as needed for anaphylaxis., Disp: 1 each, Rfl: 2   ibuprofen  (ADVIL ) 800 MG tablet, Take 1 tablet (800 mg total) by mouth 3 (  three) times daily., Disp: 21 tablet, Rfl: 0   Iron , Ferrous Sulfate , 325 (65 Fe) MG TABS, Take 325 mg by mouth daily., Disp: 90 tablet, Rfl: 3   metoCLOPramide  (REGLAN ) 5 MG tablet, Take 1 tablet (5 mg total) by mouth every 8 (eight) hours as needed for vomiting (for HEADACHE)., Disp: 60 tablet, Rfl: 2   metroNIDAZOLE  (METROGEL ) 0.75 % vaginal gel, Place 1 Applicatorful vaginally 2 (two) times daily., Disp: 70 g, Rfl: 2   mupirocin  ointment (BACTROBAN ) 2 %, Apply 1 Application topically 3 (three) times daily., Disp: 22 g, Rfl: 1   ofloxacin  (OCUFLOX ) 0.3  % ophthalmic solution, Place 1 drop into the left eye 4 (four) times daily., Disp: 5 mL, Rfl: 0   rizatriptan  (MAXALT ) 10 MG tablet, TAKE 1 TABLET BY MOUTH AT ONSET OF MIGRAINE; MAY REPEAT 1 TABLET IN 2 HOURS IF NEEDED., Disp: 10 tablet, Rfl: 1   valACYclovir  (VALTREX ) 500 MG tablet, Take 1 tablet (500 mg total) by mouth daily., Disp: 90 tablet, Rfl: 3   Vitamin D , Ergocalciferol , (DRISDOL ) 1.25 MG (50000 UNIT) CAPS capsule, TAKE 1 CAPSULE BY MOUTH ONCE WEEKLY, Disp: 12 capsule, Rfl: 3  EXAM:  VITALS per patient if applicable:  GENERAL: alert, oriented, appears well and in no acute distress  HEENT: atraumatic, conjunttiva clear, no obvious abnormalities on inspection of external nose and ears  NECK: normal movements of the head and neck  LUNGS: on inspection no signs of respiratory distress, breathing rate appears normal, no obvious gross SOB, gasping or wheezing  CV: no obvious cyanosis  MS: moves all visible extremities without noticeable abnormality  PSYCH/NEURO: pleasant and cooperative, no obvious depression or anxiety, speech and thought processing grossly intact  ASSESSMENT AND PLAN: Pharyngitis, possibly due to Strep. Treat with 10 days of Cefuroxime  and a Medrol  dose pack.  Garnette Olmsted, MD  Discussed the following assessment and plan:  No diagnosis found.     I discussed the assessment and treatment plan with the patient. The patient was provided an opportunity to ask questions and all were answered. The patient agreed with the plan and demonstrated an understanding of the instructions.   The patient was advised to call back or seek an in-person evaluation if the symptoms worsen or if the condition fails to improve as anticipated.      Review of Systems     Objective:   Physical Exam        Assessment & Plan:

## 2024-05-21 ENCOUNTER — Other Ambulatory Visit: Payer: Self-pay | Admitting: Family Medicine

## 2024-05-21 DIAGNOSIS — G43009 Migraine without aura, not intractable, without status migrainosus: Secondary | ICD-10-CM

## 2024-05-30 ENCOUNTER — Encounter: Payer: Self-pay | Admitting: Family Medicine

## 2024-05-30 ENCOUNTER — Ambulatory Visit: Admitting: Family Medicine

## 2024-05-30 ENCOUNTER — Other Ambulatory Visit: Payer: Self-pay | Admitting: Neurology

## 2024-05-30 VITALS — BP 100/62 | HR 89 | Temp 98.0°F | Wt 126.2 lb

## 2024-05-30 DIAGNOSIS — J40 Bronchitis, not specified as acute or chronic: Secondary | ICD-10-CM | POA: Diagnosis not present

## 2024-05-30 LAB — POCT INFLUENZA A/B
Influenza A, POC: NEGATIVE
Influenza B, POC: NEGATIVE

## 2024-05-30 LAB — POC COVID19 BINAXNOW: SARS Coronavirus 2 Ag: NEGATIVE

## 2024-05-30 MED ORDER — HYDROCODONE BIT-HOMATROP MBR 5-1.5 MG/5ML PO SOLN: 5.0000 mL | ORAL | 0 refills | Status: AC | PRN

## 2024-05-30 MED ORDER — DOXYCYCLINE HYCLATE 100 MG PO TABS: 100.0000 mg | ORAL_TABLET | Freq: Two times a day (BID) | ORAL | 0 refills | Status: AC

## 2024-05-30 NOTE — Progress Notes (Signed)
   Subjective:    Patient ID: Alexandra Meadows, female    DOB: 1996-06-12, 28 y.o.   MRN: [id number]  HPI Here for one week of scratchy throat and a dry cough. No fever or sinus congestion. Taking Nyquil, Dayquil,  and Mucinex.    Review of Systems  Constitutional: Negative.   HENT: Negative.    Eyes: Negative.   Respiratory:  Positive for cough. Negative for shortness of breath and wheezing.        Objective:   Physical Exam Constitutional:      Appearance: Normal appearance.  HENT:     Right Ear: Tympanic membrane, ear canal and external ear normal.     Left Ear: Tympanic membrane, ear canal and external ear normal.     Nose: Nose normal.     Mouth/Throat:     Pharynx: Oropharynx is clear.  Eyes:     Conjunctiva/sclera: Conjunctivae normal.  Pulmonary:     Effort: Pulmonary effort is normal.     Breath sounds: Normal breath sounds.  Lymphadenopathy:     Cervical: No cervical adenopathy.  Neurological:     Mental Status: She is alert.           Assessment & Plan:  Bronchitis, treat with 10 days of Doxycycline .  Garnette Olmsted, MD

## 2024-05-30 NOTE — Addendum Note (Signed)
 Addended by: LADONNA INOCENTE SAILOR on: 05/30/2024 04:23 PM   Modules accepted: Orders

## 2024-06-01 ENCOUNTER — Encounter: Payer: Self-pay | Admitting: Family Medicine

## 2024-06-05 ENCOUNTER — Telehealth: Payer: Self-pay

## 2024-06-05 NOTE — Telephone Encounter (Signed)
 MRI Brain and MRI CS approved and put in scan

## 2024-06-13 ENCOUNTER — Ambulatory Visit
Admission: RE | Admit: 2024-06-13 | Discharge: 2024-06-13 | Disposition: A | Discharge status: Home / Self Care | Source: Ambulatory Visit | Attending: Neurology | Admitting: Neurology

## 2024-06-13 ENCOUNTER — Ambulatory Visit
Admission: RE | Admit: 2024-06-13 | Discharge: 2024-06-13 | Disposition: A | Source: Ambulatory Visit | Attending: Neurology

## 2024-06-13 DIAGNOSIS — R200 Anesthesia of skin: Secondary | ICD-10-CM

## 2024-06-13 DIAGNOSIS — S0990XS Unspecified injury of head, sequela: Secondary | ICD-10-CM

## 2024-06-13 DIAGNOSIS — G8929 Other chronic pain: Secondary | ICD-10-CM

## 2024-06-13 MED ORDER — GADOPICLENOL 0.5 MMOL/ML IV SOLN
6.0000 mL | Freq: Once | INTRAVENOUS | Status: AC | PRN
  Administered 2024-06-13: 6 mL via INTRAVENOUS

## 2024-06-14 ENCOUNTER — Telehealth: Payer: Self-pay | Admitting: *Deleted

## 2024-06-14 NOTE — Telephone Encounter (Signed)
 Copied from CRM [id number]. Topic: Clinical - Medical Advice >> Jun 14, 2024 12:09 PM Viola F wrote: Reason for CRM: Patient wants to schedule tb test. I let her know that the skin tb test can only be done on Monday, Tuesday, or Wednesday mornings and she wants to inquire on how the blood test would work? Please call her at [phone] (M)

## 2024-06-15 ENCOUNTER — Ambulatory Visit

## 2024-06-15 ENCOUNTER — Other Ambulatory Visit (INDEPENDENT_AMBULATORY_CARE_PROVIDER_SITE_OTHER)

## 2024-06-15 ENCOUNTER — Other Ambulatory Visit

## 2024-06-15 DIAGNOSIS — Z111 Encounter for screening for respiratory tuberculosis: Secondary | ICD-10-CM

## 2024-06-15 NOTE — Telephone Encounter (Signed)
 Pt came by the office for a TB skin test, but changed her mind to a blood test for TB screening

## 2024-06-18 ENCOUNTER — Ambulatory Visit

## 2024-06-18 ENCOUNTER — Encounter: Payer: Self-pay | Admitting: Neurology

## 2024-06-18 ENCOUNTER — Other Ambulatory Visit

## 2024-06-18 ENCOUNTER — Ambulatory Visit: Payer: Self-pay | Admitting: Family Medicine

## 2024-06-18 LAB — QUANTIFERON-TB GOLD PLUS
Mitogen-NIL: >10 [IU]/mL
NIL: 0.02 [IU]/mL
QuantiFERON-TB Gold Plus: NEGATIVE
TB1-NIL: 0.02 [IU]/mL
TB2-NIL: 0.06 [IU]/mL

## 2024-06-18 NOTE — Progress Notes (Signed)
 Message sent to PCP for advise ?

## 2024-06-20 ENCOUNTER — Encounter: Payer: Self-pay | Admitting: Family Medicine

## 2024-06-24 ENCOUNTER — Other Ambulatory Visit: Payer: Self-pay

## 2024-06-24 DIAGNOSIS — A6004 Herpesviral vulvovaginitis: Secondary | ICD-10-CM

## 2024-06-24 MED ORDER — VALACYCLOVIR HCL 500 MG PO TABS: 500.0000 mg | ORAL_TABLET | Freq: Every day | ORAL | 3 refills | Status: AC

## 2024-06-28 ENCOUNTER — Other Ambulatory Visit: Payer: Self-pay | Admitting: Family Medicine

## 2024-06-28 DIAGNOSIS — G43009 Migraine without aura, not intractable, without status migrainosus: Secondary | ICD-10-CM

## 2024-07-03 ENCOUNTER — Ambulatory Visit: Admitting: Family Medicine

## 2024-07-03 ENCOUNTER — Encounter: Payer: Self-pay | Admitting: Family Medicine

## 2024-07-03 ENCOUNTER — Ambulatory Visit

## 2024-07-03 VITALS — BP 112/70 | HR 83 | Temp 99.0°F | Wt 131.6 lb

## 2024-07-03 DIAGNOSIS — M25531 Pain in right wrist: Secondary | ICD-10-CM

## 2024-07-03 DIAGNOSIS — M79641 Pain in right hand: Secondary | ICD-10-CM
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
## 2024-07-04 ENCOUNTER — Encounter: Payer: Self-pay | Admitting: Family Medicine

## 2024-07-09 ENCOUNTER — Ambulatory Visit: Payer: Self-pay | Admitting: Family Medicine

## 2024-08-08 ENCOUNTER — Encounter: Payer: Self-pay | Admitting: Family Medicine

## 2024-08-09 MED ORDER — AMPHETAMINE-DEXTROAMPHETAMINE 30 MG PO TABS: 30.0000 mg | ORAL_TABLET | Freq: Two times a day (BID) | ORAL | 0 refills | Status: AC

## 2024-08-09 NOTE — Telephone Encounter (Signed)
 Done
